# Patient Record
Sex: Female | Born: 1955
Health system: Southern US, Community
[De-identification: ages and names within clinical notes are randomized; demographics above are authoritative.]

## PROBLEM LIST (undated history)

## (undated) DIAGNOSIS — E119 Type 2 diabetes mellitus without complications: Secondary | ICD-10-CM

## (undated) DIAGNOSIS — I10 Essential (primary) hypertension: Secondary | ICD-10-CM

## (undated) DIAGNOSIS — E785 Hyperlipidemia, unspecified: Secondary | ICD-10-CM

## (undated) HISTORY — PX: COLONOSCOPY: SHX174

## (undated) HISTORY — PX: TUBAL LIGATION: SHX77

## (undated) HISTORY — PX: TUMOR EXCISION: SHX421

---
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
## 1998-09-24 HISTORY — PX: BREAST BIOPSY: SHX20

## 1998-11-22 ENCOUNTER — Other Ambulatory Visit: Admission: RE | Admit: 1998-11-22 | Discharge: 1998-11-22 | Payer: Self-pay | Admitting: Obstetrics

## 1999-06-14 ENCOUNTER — Encounter: Payer: Self-pay | Admitting: Family Medicine

## 1999-06-14 ENCOUNTER — Ambulatory Visit (HOSPITAL_COMMUNITY): Admission: RE | Admit: 1999-06-14 | Discharge: 1999-06-14 | Payer: Self-pay | Admitting: Family Medicine

## 1999-06-14 ENCOUNTER — Encounter (INDEPENDENT_AMBULATORY_CARE_PROVIDER_SITE_OTHER): Payer: Self-pay

## 1999-06-14 HISTORY — PX: BREAST EXCISIONAL BIOPSY: SUR124

## 1999-10-17 ENCOUNTER — Other Ambulatory Visit: Admission: RE | Admit: 1999-10-17 | Discharge: 1999-10-17 | Payer: Self-pay | Admitting: Obstetrics

## 2000-11-05 ENCOUNTER — Other Ambulatory Visit: Admission: RE | Admit: 2000-11-05 | Discharge: 2000-11-05 | Payer: Self-pay | Admitting: Obstetrics

## 2001-04-10 ENCOUNTER — Encounter: Payer: Self-pay | Admitting: Family Medicine

## 2001-04-10 ENCOUNTER — Encounter: Admission: RE | Admit: 2001-04-10 | Discharge: 2001-04-10 | Payer: Self-pay | Admitting: Family Medicine

## 2003-04-13 ENCOUNTER — Encounter: Admission: RE | Admit: 2003-04-13 | Discharge: 2003-04-13 | Payer: Self-pay | Admitting: Obstetrics

## 2003-04-13 ENCOUNTER — Encounter: Payer: Self-pay | Admitting: Obstetrics

## 2004-07-05 ENCOUNTER — Encounter: Admission: RE | Admit: 2004-07-05 | Discharge: 2004-07-05 | Payer: Self-pay | Admitting: Obstetrics

## 2004-11-13 ENCOUNTER — Ambulatory Visit (HOSPITAL_COMMUNITY): Admission: RE | Admit: 2004-11-13 | Discharge: 2004-11-13 | Payer: Self-pay | Admitting: Gastroenterology

## 2005-07-12 ENCOUNTER — Encounter: Admission: RE | Admit: 2005-07-12 | Discharge: 2005-07-12 | Payer: Self-pay | Admitting: Obstetrics

## 2006-04-01 ENCOUNTER — Encounter: Admission: RE | Admit: 2006-04-01 | Discharge: 2006-04-01 | Payer: Self-pay | Admitting: Rheumatology

## 2006-08-06 ENCOUNTER — Encounter: Admission: RE | Admit: 2006-08-06 | Discharge: 2006-08-06 | Payer: Self-pay | Admitting: Internal Medicine

## 2007-08-26 ENCOUNTER — Encounter: Admission: RE | Admit: 2007-08-26 | Discharge: 2007-08-26 | Payer: Self-pay | Admitting: Obstetrics

## 2007-08-29 ENCOUNTER — Encounter: Admission: RE | Admit: 2007-08-29 | Discharge: 2007-08-29 | Payer: Self-pay | Admitting: Obstetrics

## 2008-02-25 ENCOUNTER — Encounter: Admission: RE | Admit: 2008-02-25 | Discharge: 2008-02-25 | Payer: Self-pay | Admitting: Internal Medicine

## 2008-09-15 ENCOUNTER — Encounter: Admission: RE | Admit: 2008-09-15 | Discharge: 2008-09-15 | Payer: Self-pay | Admitting: Internal Medicine

## 2009-10-24 ENCOUNTER — Encounter: Admission: RE | Admit: 2009-10-24 | Discharge: 2009-10-24 | Payer: Self-pay | Admitting: Obstetrics

## 2010-10-16 ENCOUNTER — Other Ambulatory Visit: Payer: Self-pay | Admitting: Internal Medicine

## 2010-10-16 DIAGNOSIS — Z1239 Encounter for other screening for malignant neoplasm of breast: Secondary | ICD-10-CM

## 2010-10-26 ENCOUNTER — Ambulatory Visit: Payer: Self-pay

## 2010-10-26 ENCOUNTER — Ambulatory Visit
Admission: RE | Admit: 2010-10-26 | Discharge: 2010-10-26 | Disposition: A | Discharge status: Home / Self Care | Payer: BC Managed Care – PPO | Source: Ambulatory Visit | Attending: Internal Medicine | Admitting: Internal Medicine

## 2010-10-26 DIAGNOSIS — Z1239 Encounter for other screening for malignant neoplasm of breast: Secondary | ICD-10-CM

## 2011-02-09 NOTE — Op Note (Signed)
Monica Ryan, Monica Ryan               ACCOUNT NO.:  [id number]   MEDICAL RECORD NO.:  [id number]          PATIENT TYPE:  AMB   LOCATION:  ENDO                         FACILITY:  [facility]   PHYSICIAN:  Anselmo Rod, M.D.  DATE OF BIRTH:  02/11/1956   DATE OF PROCEDURE:  11/13/2004  DATE OF DISCHARGE:                                 OPERATIVE REPORT   PROCEDURE PERFORMED:  Screening colonoscopy.   ENDOSCOPIST:  Charna Elizabeth, M.D.   INSTRUMENT USED:  Olympus video colonoscope.   INDICATIONS FOR PROCEDURE:  The patient is a 55 year old African-American  female undergoing screening colonoscopy to rule out colonic polyps, masses,  etc.   PREPROCEDURE PREPARATION:  Informed consent was procured from the patient.  The patient was fasted for eight hours prior to the procedure and prepped  with a bottle of magnesium citrate and a gallon of GoLYTELY the night prior  to the procedure.  The risks and benefits of the procedure including a 10%  miss rate for colon polyps or cancers was discussed with the patient as  well.   PREPROCEDURE PHYSICAL:  The patient had stable vital signs.  Neck supple.  Chest clear to auscultation.  S1 and S2 regular.  Abdomen soft with normal  bowel sounds.   DESCRIPTION OF PROCEDURE:  The patient was placed in left lateral decubitus  position and sedated with 70 mg of Demerol and 7.5 mg of Versed in slow  incremental doses.  Once the patient was adequately sedated and maintained  on low flow oxygen and continuous cardiac monitoring, the Olympus video  colonoscope was advanced from the rectum to the cecum.  The appendicular  orifice and ileocecal valve were clearly visualized and photographed.  The  patient had an excellent prep.  No masses, polyps, erosions, ulcerations or  diverticula were seen.  Retroflexion in the rectum revealed no  abnormalities.   IMPRESSION:  Normal colonoscopy up to the cecum, no masses, polyps or  diverticula were seen.   RECOMMENDATIONS:  1.  Repeat colonoscopy is recommended in the next 10 years unless the      patient develops any abnormal symptoms      in the interim.  2.  Outpatient followup as need arises in the future.  3.  Continue high fiber diet with liberal fluid intake as previously      advised.      JNM/MEDQ  D:  11/13/2004  T:  11/13/2004  Job:  [id number]   cc:   Candyce Churn. Allyne Gee, M.D.  [address]  [address]  [place]  [place] [address]  Fax: [phone]   Kathreen Cosier, M.D.  [address]  [place]  [place] [address]  Fax: [phone]

## 2011-10-02 ENCOUNTER — Other Ambulatory Visit: Payer: Self-pay | Admitting: Internal Medicine

## 2011-10-02 DIAGNOSIS — Z1231 Encounter for screening mammogram for malignant neoplasm of breast: Secondary | ICD-10-CM

## 2011-10-29 ENCOUNTER — Ambulatory Visit
Admission: RE | Admit: 2011-10-29 | Discharge: 2011-10-29 | Disposition: A | Discharge status: Home / Self Care | Payer: BC Managed Care – PPO | Source: Ambulatory Visit | Attending: Internal Medicine | Admitting: Internal Medicine

## 2011-10-29 DIAGNOSIS — Z1231 Encounter for screening mammogram for malignant neoplasm of breast: Secondary | ICD-10-CM

## 2012-07-22 ENCOUNTER — Other Ambulatory Visit: Payer: Self-pay | Admitting: Orthopedic Surgery

## 2012-07-24 ENCOUNTER — Encounter (HOSPITAL_BASED_OUTPATIENT_CLINIC_OR_DEPARTMENT_OTHER): Payer: Self-pay | Admitting: *Deleted

## 2012-07-24 NOTE — Progress Notes (Signed)
To come in for bmet-called for ekg from dr Amanda Pea bring all meds and overnight bag

## 2012-07-28 ENCOUNTER — Encounter (HOSPITAL_BASED_OUTPATIENT_CLINIC_OR_DEPARTMENT_OTHER)
Admission: RE | Admit: 2012-07-28 | Discharge: 2012-07-28 | Disposition: A | Discharge status: Home / Self Care | Payer: BC Managed Care – PPO | Source: Ambulatory Visit | Attending: Orthopedic Surgery | Admitting: Orthopedic Surgery

## 2012-07-28 LAB — BASIC METABOLIC PANEL
CO2: 26 mEq/L (ref 19–32)
Calcium: 9.5 mg/dL (ref 8.4–10.5)
Chloride: 102 mEq/L (ref 96–112)
Glucose, Bld: 98 mg/dL (ref 70–99)
Sodium: 140 mEq/L (ref 135–145)

## 2012-07-29 ENCOUNTER — Encounter (HOSPITAL_BASED_OUTPATIENT_CLINIC_OR_DEPARTMENT_OTHER)
Admission: RE | Disposition: A | Discharge status: Home / Self Care | Payer: Self-pay | Source: Ambulatory Visit | Attending: Orthopedic Surgery

## 2012-07-29 ENCOUNTER — Encounter (HOSPITAL_BASED_OUTPATIENT_CLINIC_OR_DEPARTMENT_OTHER): Payer: Self-pay | Admitting: *Deleted

## 2012-07-29 ENCOUNTER — Ambulatory Visit (HOSPITAL_BASED_OUTPATIENT_CLINIC_OR_DEPARTMENT_OTHER)
Admission: RE | Admit: 2012-07-29 | Discharge: 2012-07-30 | Disposition: A | Discharge status: Home / Self Care | Payer: BC Managed Care – PPO | Source: Ambulatory Visit | Attending: Orthopedic Surgery | Admitting: Orthopedic Surgery

## 2012-07-29 ENCOUNTER — Encounter (HOSPITAL_BASED_OUTPATIENT_CLINIC_OR_DEPARTMENT_OTHER): Payer: Self-pay | Admitting: Anesthesiology

## 2012-07-29 ENCOUNTER — Ambulatory Visit (HOSPITAL_BASED_OUTPATIENT_CLINIC_OR_DEPARTMENT_OTHER): Payer: BC Managed Care – PPO | Admitting: Anesthesiology

## 2012-07-29 DIAGNOSIS — M19019 Primary osteoarthritis, unspecified shoulder: Secondary | ICD-10-CM | POA: Insufficient documentation

## 2012-07-29 DIAGNOSIS — E119 Type 2 diabetes mellitus without complications: Secondary | ICD-10-CM | POA: Insufficient documentation

## 2012-07-29 DIAGNOSIS — I10 Essential (primary) hypertension: Secondary | ICD-10-CM | POA: Insufficient documentation

## 2012-07-29 DIAGNOSIS — Z5333 Arthroscopic surgical procedure converted to open procedure: Secondary | ICD-10-CM | POA: Insufficient documentation

## 2012-07-29 DIAGNOSIS — Z79899 Other long term (current) drug therapy: Secondary | ICD-10-CM | POA: Insufficient documentation

## 2012-07-29 DIAGNOSIS — M75120 Complete rotator cuff tear or rupture of unspecified shoulder, not specified as traumatic: Secondary | ICD-10-CM | POA: Insufficient documentation

## 2012-07-29 DIAGNOSIS — E785 Hyperlipidemia, unspecified: Secondary | ICD-10-CM | POA: Insufficient documentation

## 2012-07-29 HISTORY — DX: Essential (primary) hypertension: I10

## 2012-07-29 HISTORY — DX: Type 2 diabetes mellitus without complications: E11.9

## 2012-07-29 HISTORY — DX: Hyperlipidemia, unspecified: E78.5

## 2012-07-29 HISTORY — PX: SHOULDER ARTHROSCOPY WITH ROTATOR CUFF REPAIR AND SUBACROMIAL DECOMPRESSION: SHX5686

## 2012-07-29 LAB — POCT HEMOGLOBIN-HEMACUE: Hemoglobin: 13.3 g/dL (ref 12.0–15.0)

## 2012-07-29 SURGERY — SHOULDER ARTHROSCOPY WITH ROTATOR CUFF REPAIR AND SUBACROMIAL DECOMPRESSION
Anesthesia: Regional | Site: Shoulder | Laterality: Right | Wound class: Clean

## 2012-07-29 MED ORDER — ONDANSETRON HCL 4 MG/2ML IJ SOLN
INTRAMUSCULAR | Status: DC | PRN
  Administered 2012-07-29: 4 mg via INTRAVENOUS

## 2012-07-29 MED ORDER — SODIUM CHLORIDE 0.9 % IR SOLN
Status: DC | PRN
  Administered 2012-07-29: 9000 mL

## 2012-07-29 MED ORDER — ROPIVACAINE HCL 5 MG/ML IJ SOLN
INTRAMUSCULAR | Status: DC | PRN
  Administered 2012-07-29: 30 mL via EPIDURAL

## 2012-07-29 MED ORDER — FENTANYL CITRATE 0.05 MG/ML IJ SOLN
50.0000 ug | INTRAMUSCULAR | Status: DC | PRN
  Administered 2012-07-29: 100 ug via INTRAVENOUS

## 2012-07-29 MED ORDER — HYDROMORPHONE HCL PF 1 MG/ML IJ SOLN: 0.5000 mg | INTRAMUSCULAR | Status: DC | PRN

## 2012-07-29 MED ORDER — SIMVASTATIN 40 MG PO TABS: 40.0000 mg | ORAL_TABLET | Freq: Every evening | ORAL | Status: DC

## 2012-07-29 MED ORDER — METHOCARBAMOL 500 MG PO TABS: 500.0000 mg | ORAL_TABLET | Freq: Four times a day (QID) | ORAL | Status: DC | PRN

## 2012-07-29 MED ORDER — OXYCODONE-ACETAMINOPHEN 5-325 MG PO TABS: 1.0000 | ORAL_TABLET | ORAL | Status: DC | PRN

## 2012-07-29 MED ORDER — IBUPROFEN 600 MG PO TABS: 600.0000 mg | ORAL_TABLET | Freq: Four times a day (QID) | ORAL | Status: DC | PRN

## 2012-07-29 MED ORDER — ACETAMINOPHEN 10 MG/ML IV SOLN
1000.0000 mg | Freq: Once | INTRAVENOUS | Status: AC
  Administered 2012-07-29: 1000 mg via INTRAVENOUS

## 2012-07-29 MED ORDER — METOCLOPRAMIDE HCL 5 MG/ML IJ SOLN: 5.0000 mg | Freq: Three times a day (TID) | INTRAMUSCULAR | Status: DC | PRN

## 2012-07-29 MED ORDER — SODIUM CHLORIDE 0.9 % IV SOLN
INTRAVENOUS | Status: DC
  Administered 2012-07-29: 20 mL/h via INTRAVENOUS

## 2012-07-29 MED ORDER — HYDROMORPHONE HCL 2 MG PO TABS: ORAL_TABLET | ORAL | Status: DC

## 2012-07-29 MED ORDER — CEPHALEXIN 500 MG PO CAPS: 500.0000 mg | ORAL_CAPSULE | Freq: Three times a day (TID) | ORAL | Status: DC

## 2012-07-29 MED ORDER — HYDROMORPHONE HCL PF 1 MG/ML IJ SOLN: 0.2500 mg | INTRAMUSCULAR | Status: DC | PRN

## 2012-07-29 MED ORDER — CHLORHEXIDINE GLUCONATE 4 % EX LIQD: 60.0000 mL | Freq: Once | CUTANEOUS | Status: DC

## 2012-07-29 MED ORDER — PROPOFOL 10 MG/ML IV BOLUS
INTRAVENOUS | Status: DC | PRN
  Administered 2012-07-29: 180 mg via INTRAVENOUS

## 2012-07-29 MED ORDER — EPHEDRINE SULFATE 50 MG/ML IJ SOLN
INTRAMUSCULAR | Status: DC | PRN
  Administered 2012-07-29 (×2): 10 mg via INTRAVENOUS

## 2012-07-29 MED ORDER — OXYCODONE HCL 5 MG PO TABS: 5.0000 mg | ORAL_TABLET | Freq: Once | ORAL | Status: AC | PRN

## 2012-07-29 MED ORDER — METHOCARBAMOL 100 MG/ML IJ SOLN: 500.0000 mg | Freq: Four times a day (QID) | INTRAMUSCULAR | Status: DC | PRN

## 2012-07-29 MED ORDER — SAXAGLIPTIN-METFORMIN ER 5-1000 MG PO TB24: ORAL_TABLET | ORAL | Status: DC

## 2012-07-29 MED ORDER — CEFAZOLIN SODIUM-DEXTROSE 2-3 GM-% IV SOLR
2.0000 g | Freq: Once | INTRAVENOUS | Status: AC
  Administered 2012-07-29: 2 g via INTRAVENOUS

## 2012-07-29 MED ORDER — MIDAZOLAM HCL 2 MG/2ML IJ SOLN
0.5000 mg | INTRAMUSCULAR | Status: DC | PRN
  Administered 2012-07-29: 2 mg via INTRAVENOUS

## 2012-07-29 MED ORDER — LISINOPRIL 20 MG PO TABS: 20.0000 mg | ORAL_TABLET | Freq: Every day | ORAL | Status: DC

## 2012-07-29 MED ORDER — ONDANSETRON HCL 4 MG/2ML IJ SOLN: 4.0000 mg | Freq: Four times a day (QID) | INTRAMUSCULAR | Status: DC | PRN

## 2012-07-29 MED ORDER — METOCLOPRAMIDE HCL 5 MG PO TABS: 5.0000 mg | ORAL_TABLET | Freq: Three times a day (TID) | ORAL | Status: DC | PRN

## 2012-07-29 MED ORDER — LIDOCAINE HCL (CARDIAC) 20 MG/ML IV SOLN
INTRAVENOUS | Status: DC | PRN
  Administered 2012-07-29: 50 mg via INTRAVENOUS

## 2012-07-29 MED ORDER — ONDANSETRON HCL 4 MG PO TABS: 4.0000 mg | ORAL_TABLET | Freq: Four times a day (QID) | ORAL | Status: DC | PRN

## 2012-07-29 MED ORDER — OXYCODONE HCL 5 MG/5ML PO SOLN: 5.0000 mg | Freq: Once | ORAL | Status: AC | PRN

## 2012-07-29 MED ORDER — DEXAMETHASONE SODIUM PHOSPHATE 4 MG/ML IJ SOLN
INTRAMUSCULAR | Status: DC | PRN
  Administered 2012-07-29: 4 mg via INTRAVENOUS

## 2012-07-29 MED ORDER — LACTATED RINGERS IV SOLN
INTRAVENOUS | Status: DC
  Administered 2012-07-29 (×2): via INTRAVENOUS

## 2012-07-29 MED ORDER — CEFAZOLIN SODIUM 1-5 GM-% IV SOLN
1.0000 g | Freq: Four times a day (QID) | INTRAVENOUS | Status: AC
  Administered 2012-07-29 – 2012-07-30 (×3): 1 g via INTRAVENOUS

## 2012-07-29 MED ORDER — SUCCINYLCHOLINE CHLORIDE 20 MG/ML IJ SOLN
INTRAMUSCULAR | Status: DC | PRN
  Administered 2012-07-29: 100 mg via INTRAVENOUS

## 2012-07-29 SURGICAL SUPPLY — 84 items
ANCH SUT PUSHLCK 24X4.5 STRL (Orthopedic Implant) ×2 IMPLANT
ANCH SUT SWLK 19.1 CLS EYLT TL (Anchor) ×1 IMPLANT
ANCH SUT SWLK 19.1X4.75 (Anchor) ×1 IMPLANT
ANCHOR SUT BIO SW 4.75 W/FIB (Anchor) ×1 IMPLANT
ANCHOR SUT BIO SW 4.75X19.1 (Anchor) ×1 IMPLANT
BANDAGE ADHESIVE 1X3 (GAUZE/BANDAGES/DRESSINGS) IMPLANT
BLADE AVERAGE 25X9 (BLADE) IMPLANT
BLADE CUTTER MENIS 5.5 (BLADE) IMPLANT
BLADE SURG 15 STRL LF DISP TIS (BLADE) ×2 IMPLANT
BLADE SURG 15 STRL SS (BLADE) ×4
BUR EGG 3PK/BX (BURR) IMPLANT
BUR OVAL 6.0 (BURR) ×2 IMPLANT
CANISTER OMNI JUG 16 LITER (MISCELLANEOUS) ×3 IMPLANT
CANISTER SUCTION 2500CC (MISCELLANEOUS) IMPLANT
CANNULA TWIST IN 8.25X7CM (CANNULA) ×1 IMPLANT
CLEANER CAUTERY TIP 5X5 PAD (MISCELLANEOUS) IMPLANT
CLOTH BEACON ORANGE TIMEOUT ST (SAFETY) ×2 IMPLANT
CUTTER MENISCUS  4.2MM (BLADE) ×1
CUTTER MENISCUS 4.2MM (BLADE) ×1 IMPLANT
DECANTER SPIKE VIAL GLASS SM (MISCELLANEOUS) IMPLANT
DRAPE INCISE IOBAN 66X45 STRL (DRAPES) ×2 IMPLANT
DRAPE STERI 35X30 U-POUCH (DRAPES) ×2 IMPLANT
DRAPE SURG 17X23 STRL (DRAPES) ×2 IMPLANT
DRAPE U-SHAPE 47X51 STRL (DRAPES) ×2 IMPLANT
DRAPE U-SHAPE 76X120 STRL (DRAPES) ×4 IMPLANT
DRSG PAD ABDOMINAL 8X10 ST (GAUZE/BANDAGES/DRESSINGS) ×2 IMPLANT
DURAPREP 26ML APPLICATOR (WOUND CARE) ×2 IMPLANT
ELECT REM PT RETURN 9FT ADLT (ELECTROSURGICAL) ×2
ELECTRODE REM PT RTRN 9FT ADLT (ELECTROSURGICAL) IMPLANT
GLOVE BIO SURGEON STRL SZ 6.5 (GLOVE) ×2 IMPLANT
GLOVE BIOGEL M STRL SZ7.5 (GLOVE) ×2 IMPLANT
GLOVE BIOGEL PI IND STRL 7.0 (GLOVE) IMPLANT
GLOVE BIOGEL PI IND STRL 8 (GLOVE) ×2 IMPLANT
GLOVE BIOGEL PI INDICATOR 7.0 (GLOVE) ×2
GLOVE BIOGEL PI INDICATOR 8 (GLOVE) ×2
GLOVE ORTHO TXT STRL SZ7.5 (GLOVE) ×2 IMPLANT
GOWN BRE IMP PREV XXLGXLNG (GOWN DISPOSABLE) ×3 IMPLANT
GOWN PREVENTION PLUS XLARGE (GOWN DISPOSABLE) ×2 IMPLANT
GOWN STRL REIN 2XL XLG LVL4 (GOWN DISPOSABLE) ×2 IMPLANT
NDL SCORPION (NEEDLE) ×1 IMPLANT
NDL SUT 6 .5 CRC .975X.05 MAYO (NEEDLE) IMPLANT
NEEDLE MAYO TAPER (NEEDLE) ×2
NEEDLE MINI RC 24MM (NEEDLE) IMPLANT
NEEDLE SCORPION (NEEDLE) ×2 IMPLANT
PACK ARTHROSCOPY DSU (CUSTOM PROCEDURE TRAY) ×2 IMPLANT
PACK BASIN DAY SURGERY FS (CUSTOM PROCEDURE TRAY) ×2 IMPLANT
PAD CLEANER CAUTERY TIP 5X5 (MISCELLANEOUS)
PASSER SUT SWANSON 36MM LOOP (INSTRUMENTS) IMPLANT
PENCIL BUTTON HOLSTER BLD 10FT (ELECTRODE) ×1 IMPLANT
PUSHLOCK PEEK 4.5X24 (Orthopedic Implant) ×2 IMPLANT
SLEEVE SCD COMPRESS KNEE MED (MISCELLANEOUS) ×2 IMPLANT
SLING ARM FOAM STRAP LRG (SOFTGOODS) ×1 IMPLANT
SLING ARM FOAM STRAP MED (SOFTGOODS) IMPLANT
SPONGE GAUZE 4X4 12PLY (GAUZE/BANDAGES/DRESSINGS) ×2 IMPLANT
SPONGE LAP 4X18 X RAY DECT (DISPOSABLE) ×1 IMPLANT
STRIP CLOSURE SKIN 1/2X4 (GAUZE/BANDAGES/DRESSINGS) ×1 IMPLANT
SUCTION FRAZIER TIP 10 FR DISP (SUCTIONS) IMPLANT
SUT ETHIBOND 2 OS 4 DA (SUTURE) IMPLANT
SUT ETHILON 4 0 PS 2 18 (SUTURE) IMPLANT
SUT FIBERWIRE #2 38 T-5 BLUE (SUTURE)
SUT FIBERWIRE 3-0 18 TAPR NDL (SUTURE)
SUT PROLENE 1 CT (SUTURE) IMPLANT
SUT PROLENE 3 0 PS 2 (SUTURE) ×2 IMPLANT
SUT TIGER TAPE 7 IN WHITE (SUTURE) ×1 IMPLANT
SUT VIC AB 0 CT1 27 (SUTURE)
SUT VIC AB 0 CT1 27XBRD ANBCTR (SUTURE) IMPLANT
SUT VIC AB 0 SH 27 (SUTURE) ×1 IMPLANT
SUT VIC AB 2-0 SH 27 (SUTURE) ×2
SUT VIC AB 2-0 SH 27XBRD (SUTURE) IMPLANT
SUT VIC AB 3-0 SH 27 (SUTURE)
SUT VIC AB 3-0 SH 27X BRD (SUTURE) IMPLANT
SUT VIC AB 3-0 X1 27 (SUTURE) IMPLANT
SUTURE FIBERWR #2 38 T-5 BLUE (SUTURE) IMPLANT
SUTURE FIBERWR 3-0 18 TAPR NDL (SUTURE) IMPLANT
SYR 3ML 23GX1 SAFETY (SYRINGE) IMPLANT
SYR BULB 3OZ (MISCELLANEOUS) IMPLANT
TAPE FIBER 2MM 7IN #2 BLUE (SUTURE) ×1 IMPLANT
TAPE PAPER 3X10 WHT MICROPORE (GAUZE/BANDAGES/DRESSINGS) ×2 IMPLANT
TOWEL OR 17X24 6PK STRL BLUE (TOWEL DISPOSABLE) ×2 IMPLANT
TUBE CONNECTING 20X1/4 (TUBING) ×3 IMPLANT
TUBING ARTHROSCOPY IRRIG 16FT (MISCELLANEOUS) ×2 IMPLANT
WAND STAR VAC 90 (SURGICAL WAND) ×2 IMPLANT
WATER STERILE IRR 1000ML POUR (IV SOLUTION) ×2 IMPLANT
YANKAUER SUCT BULB TIP NO VENT (SUCTIONS) IMPLANT

## 2012-07-29 NOTE — Anesthesia Procedure Notes (Addendum)
Anesthesia Regional Block:  Interscalene brachial plexus block  Pre-Anesthetic Checklist: ,, timeout performed, Correct Patient, Correct Site, Correct Laterality, Correct Procedure, Correct Position, site marked, Risks and benefits discussed, pre-op evaluation,  At surgeon's request and post-op pain management  Laterality: Right  Prep: Maximum Sterile Barrier Precautions used and chloraprep       Needles:  Injection technique: Single-shot  Needle Type: Echogenic Stimulator Needle      Needle Gauge: 22 and 22 G    Additional Needles:  Procedures: ultrasound guided (picture in chart) and nerve stimulator Interscalene brachial plexus block  Nerve Stimulator or Paresthesia:  Response: Biceps response, 0.4 mA,   Additional Responses:   Narrative:  Start time: 07/29/2012 11:44 AM End time: 07/29/2012 11:50 AM Injection made incrementally with aspirations every 5 mL. Anesthesiologist: Sampson Goon, MD  Additional Notes: 2% Lidocaine skin wheel.   Interscalene brachial plexus block Procedure Name: Intubation Date/Time: 07/29/2012 12:40 PM Performed by: Caren Macadam Pre-anesthesia Checklist: Patient identified, Emergency Drugs available, Suction available and Patient being monitored Patient Re-evaluated:Patient Re-evaluated prior to inductionOxygen Delivery Method: Circle System Utilized Preoxygenation: Pre-oxygenation with 100% oxygen Intubation Type: IV induction Ventilation: Mask ventilation without difficulty Laryngoscope Size: Miller and 2 Grade View: Grade II Tube type: Oral Tube size: 8.0 mm Number of attempts: 1 Airway Equipment and Method: stylet and oral airway Placement Confirmation: ETT inserted through vocal cords under direct vision,  positive ETCO2 and breath sounds checked- equal and bilateral Secured at: 21 cm Tube secured with: Tape Dental Injury: Teeth and Oropharynx as per pre-operative assessment

## 2012-07-29 NOTE — Transfer of Care (Signed)
Immediate Anesthesia Transfer of Care Note  Patient: Monica Ryan  Procedure(s) Performed: Procedure(s) (LRB) with comments: SHOULDER ARTHROSCOPY WITH ROTATOR CUFF REPAIR AND SUBACROMIAL DECOMPRESSION (Right) - Arthroscopy Subacromial Decompression, Distal Clavicle Resection, Arthroscopic Subscapularis Repair, Open Repair of Supraspinatus and Infraspinatus    Patient Location: PACU  Anesthesia Type:GA combined with regional for post-op pain  Level of Consciousness: awake  Airway & Oxygen Therapy: Patient Spontanous Breathing and Patient connected to face mask oxygen  Post-op Assessment: Report given to PACU RN and Post -op Vital signs reviewed and stable  Post vital signs: Reviewed and stable  Complications: No apparent anesthesia complications

## 2012-07-29 NOTE — Anesthesia Postprocedure Evaluation (Signed)
  Anesthesia Post-op Note  Patient: Monica Ryan  Procedure(s) Performed: Procedure(s) (LRB) with comments: SHOULDER ARTHROSCOPY WITH ROTATOR CUFF REPAIR AND SUBACROMIAL DECOMPRESSION (Right) - Arthroscopy Subacromial Decompression, Distal Clavicle Resection, Arthroscopic Subscapularis Repair, Open Repair of Supraspinatus and Infraspinatus    Patient Location: PACU  Anesthesia Type:GA combined with regional for post-op pain  Level of Consciousness: awake and alert   Airway and Oxygen Therapy: Patient Spontanous Breathing  Post-op Pain: none  Post-op Assessment: Post-op Vital signs reviewed, Patient's Cardiovascular Status Stable, Respiratory Function Stable, Patent Airway and No signs of Nausea or vomiting  Post-op Vital Signs: Reviewed and stable  Complications: No apparent anesthesia complications

## 2012-07-29 NOTE — Anesthesia Preprocedure Evaluation (Signed)
Anesthesia Evaluation  Patient identified by MRN, date of birth, ID band Patient awake    Reviewed: Allergy & Precautions, H&P , NPO status , Patient's Chart, lab work & pertinent test results  Airway Mallampati: II TM Distance: >3 FB Neck ROM: Full    Dental No notable dental hx. (+) Teeth Intact and Dental Advisory Given   Pulmonary neg pulmonary ROS,  breath sounds clear to auscultation  Pulmonary exam normal       Cardiovascular hypertension, On Medications Rhythm:Regular Rate:Normal     Neuro/Psych negative neurological ROS  negative psych ROS   GI/Hepatic negative GI ROS, Neg liver ROS,   Endo/Other  diabetes, Type 2, Oral Hypoglycemic Agents  Renal/GU negative Renal ROS  negative genitourinary   Musculoskeletal   Abdominal   Peds  Hematology negative hematology ROS (+)   Anesthesia Other Findings   Reproductive/Obstetrics negative OB ROS                           Anesthesia Physical Anesthesia Plan  ASA: II  Anesthesia Plan: General and Regional   Post-op Pain Management:    Induction: Intravenous  Airway Management Planned: Oral ETT  Additional Equipment:   Intra-op Plan:   Post-operative Plan: Extubation in OR  Informed Consent: I have reviewed the patients History and Physical, chart, labs and discussed the procedure including the risks, benefits and alternatives for the proposed anesthesia with the patient or authorized representative who has indicated his/her understanding and acceptance.   Dental advisory given  Plan Discussed with: CRNA  Anesthesia Plan Comments:         Anesthesia Quick Evaluation

## 2012-07-29 NOTE — Progress Notes (Signed)
Assisted Dr. Fitzgerald with right, ultrasound guided, interscalene  block. Side rails up, monitors on throughout procedure. See vital signs in flow sheet. Tolerated Procedure well. 

## 2012-07-29 NOTE — Brief Op Note (Signed)
07/29/2012  2:24 PM  PATIENT:  Monica Ryan  56 y.o. female  PRE-OPERATIVE DIAGNOSIS:  right shoulder rotator cuff tear, acromioclavicular arthritis  POST-OPERATIVE DIAGNOSIS:  right shoulder rotator cuff tear, three tendon and acromioclavicular arthritis  PROCEDURE:  Procedure(s) (LRB) with comments: SHOULDER ARTHROSCOPY WITH ROTATOR CUFF REPAIR AND SUBACROMIAL DECOMPRESSION (Right) - Arthroscopy Subacromial Decompression, Distal Clavicle Resection, Arthroscopic Subscapularis Repair, Open Repair of Supraspinatus and Infraspinatus    SURGEON:  Surgeon(s) and Role:    * Wyn Forster., MD - Primary  PHYSICIAN ASSISTANT:   ASSISTANTS:Raudel Bazen Dasnoit,P.A-C    ANESTHESIA:   general  EBL:  Total I/O In: 1500 [I.V.:1500] Out: -   BLOOD ADMINISTERED:none  DRAINS: none   LOCAL MEDICATIONS USED:  ropivicaine plexus block  SPECIMEN:  No Specimen  DISPOSITION OF SPECIMEN:  N/A  COUNTS:  YES  TOURNIQUET:  * No tourniquets in log *  DICTATION: .Other Dictation: Dictation Number dictated and signed  PLAN OF CARE: admit to recovery care after PACU  PATIENT DISPOSITION:  PACU - hemodynamically stable.

## 2012-07-29 NOTE — H&P (Signed)
Monica Ryan is an 56 y.o. female.   Chief Complaint: c/o chronic and progressive pain right shoulder HPI:  Patient presented to our office recently for evaluation and treatment of multiple orthopedic problems on 07/02/12 which involved progression of her CTS symptoms. During our conversation she reports that her main problem at this time is increasing pain in her right shoulder. She cannot sleep comfortably at night, she cannot work on her hair, and she cannot lie on her right side. She has weakness of abduction, scaption, and external rotation in 90 degrees abduction.    Past Medical History  Diagnosis Date  . Hypertension   . Diabetes mellitus without complication   . Hyperlipemia     Past Surgical History  Procedure Date  . Tubal ligation   . Tumor excision     right jaw  . Breast lumpectomy 1974    right-negative  . Colonoscopy     No family history on file. Social History:  reports that she has never smoked. She does not have any smokeless tobacco history on file. She reports that she drinks alcohol. Her drug history not on file.  Allergies: No Known Allergies  No prescriptions prior to admission    Results for orders placed during the hospital encounter of 07/29/12 (from the past 48 hour(s))  BASIC METABOLIC PANEL     Status: Normal   Collection Time   07/28/12 11:00 AM      Component Value Range Comment   Sodium 140  135 - 145 mEq/L    Potassium 3.6  3.5 - 5.1 mEq/L    Chloride 102  96 - 112 mEq/L    CO2 26  19 - 32 mEq/L    Glucose, Bld 98  70 - 99 mg/dL    BUN 16  6 - 23 mg/dL    Creatinine, Ser 1.61  0.50 - 1.10 mg/dL    Calcium 9.5  8.4 - 09.6 mg/dL    GFR calc non Af Amer >90  >90 mL/min    GFR calc Af Amer >90  >90 mL/min     No results found.   Pertinent items are noted in HPI.  Height 5\' 1"  (1.549 m), weight 79.379 kg (175 lb).  General appearance: alert Head: Normocephalic, without obvious abnormality Neck: supple, symmetrical, trachea  midline Resp: clear to auscultation bilaterally Cardio: regular rate and rhythm GI: normal findings: bowel sounds normal Extremities:  She has restricted cervical ROM consistent with cervical degenerative disc disease. She does not have radicular signs or symptoms. She has restriction in shoulder ROM with combined abduction right shoulder 170 degrees vs 175 on the left. She has dyssynergy on the right consistent with impingement. She has a positive rapid abduction sign, pain with cross torso adduction, and weakness on true scaption. She can internally rotate to T12 bilaterally. She has a painful push off test on the right. Her belly pad is negative. She has pain on forward flexion and O'Brien's testing on the right. She has external rotation 90 degrees abduction 90 degrees right and 90 degrees left. She can extend to the interscapular plane bilaterally. Plain films of her shoulder demonstrate a type III acromion and AC degenerative change. She does not have gross irregularity at the greater tuberosity.  Pulses: 2+ and symmetric Skin:WNL Neurologic:alert and oriented X3    Assessment/Plan Impression: Right shoulder impingement with A/C arthrosis and RC tear  Plan: To the OR for right SA with SAD/DCR and RC repair as needed.The procedure, risks,benefits and  post-op course were discussed with the patient at length and they were in agreement with the plan.   DASNOIT,Monica Ryan 07/29/2012, 7:11 AM

## 2012-07-30 ENCOUNTER — Encounter (HOSPITAL_BASED_OUTPATIENT_CLINIC_OR_DEPARTMENT_OTHER): Payer: Self-pay | Admitting: Orthopedic Surgery

## 2012-07-30 NOTE — Op Note (Signed)
NAMECLAUD, Monica Ryan               ACCOUNT NO.:  [id number]  MEDICAL RECORD NO.:  [id number]  LOCATION:                                 FACILITY:  PHYSICIAN:  Katy Fitch. Ermalinda Joubert, M.D. DATE OF BIRTH:  01/04/1956  DATE OF PROCEDURE:  07/29/2012 DATE OF DISCHARGE:                              OPERATIVE REPORT   PREOPERATIVE DIAGNOSIS:  MRI documented full-thickness rotator cuff tear with unfavorable AC anatomy, and labral degenerative changes.  POSTOPERATIVE DIAGNOSIS:  Grade 2 subscapularis rotator cuff tendon tear and full-thickness infraspinatus and supraspinatus and advanced degenerative arthritis of acromioclavicular joint.  OPERATION: 1. Diagnostic arthroscopy, right glenohumeral joint. 2. Arthroscopic debridement of subscapularis followed by arthroscopic     reconstruction of grade 2 subscapularis rotator cuff tear with     decortication of lesser tuberosity and placement of a fiber tape     reverse mattress stitch to a 4.75-mm swivel lock at superior     greater tuberosity.  This also buttressed the long head of the     biceps to prevent medial migration. 3. Arthroscopic subacromial decompression with bursectomy,     coracoacromial ligament release, and acromioplasty. 4. Arthroscopic distal clavicle resection. 5. Hybrid reconstruction of rotator cuff, supraspinatus/infraspinatus     junction tear to decorticated greater tuberosity.  OPERATING SURGEON:  Katy Fitch. Farrel Guimond, M.D.  ASSISTANT:  Marveen Reeks. Dasnoit, PA-C.  ANESTHESIA:  General endotracheal supplemented by a right interscalene block.  SUPERVISING ANESTHESIOLOGIST:  Bufford Buttner, MD.  INDICATIONS:  Monica Ryan is a 56 year old woman referred through the courtesy of Dr. Dorothyann Peng for evaluation and management of shoulder pain and impairment.  Clinical examination revealed signs of probable rotator cuff tear and impingement.  An MRI of the shoulder documented full-thickness tearing of the  supraspinatus/infraspinatus rotator cuff tendons at the junction of these 2 tendons laterally.  In addition, there was abnormal signal in the superior subscapularis and unfavorable AC anatomy.  We advised Monica Ryan to undergo diagnostic arthroscopy anticipating arthroscopic subacromial decompression, distal clavicle resection, and possible repair of a PASTA tear versus a full-thickness rotator cuff tear.  Preoperatively, she was reminded of potential risks and benefits of surgery.  Questions were invited and answered in detail.  PROCEDURE:  Monica Ryan was interviewed in the holding area and reminded of our findings on the MRI.  We recommended proceeding with diagnostic arthroscopy, subacromial decompression, distal clavicle resection, and anticipated repair of the rotator cuff.  She was advised as to the aftercare for simple decompression versus rotator cuff repair. Questions were invited and answered in detail.  She was subsequently interviewed by Dr. Sampson Goon of Anesthesia.  After informed consent, he placed a ropivacaine interscalene block with ultrasound control.  Excellent anesthesia of the right shoulder and forequarter was achieved.  Monica Ryan was subsequently transferred to room 2 of the [facility] where under Dr. Jarrett Ables direct supervision, general endotracheal anesthesia was induced.  She was carefully positioned in the beach chair position with aid of a torso and head holder designed for shoulder arthroscopy.  The entire right upper extremity and forequarter was prepped with DuraPrep and draped with impervious arthroscopy drapes.  Following routine surgical time-out,  procedure commenced with instrumenting the shoulder with an anterior switching stick technique, placing an arthroscope into the posterior viewing portal bluntly.  Diagnostic arthroscopy confirmed a full-thickness rotator cuff tear at the junction of the supraspinatus/infraspinatus  tendons.  The subscapularis had a grade 2 tear that had separated from the lesser tuberosity.  The long head of the biceps had some fray at the rotator interval and had medialized slightly.  There was an emerging comma sign.  We performed debridement of the labrum, granulation tissue, synovitis, and the deep surface of the rotator cuff tears.  The lesser tuberosity was decorticated with a suction shaver brought into an anterior superior lateral portal followed by placement of a clear cannula.  A scorpion suture passer was used to place a fiber tape with a reverse mattress suture into the subscapularis with excellent purchase.  The clear cannula was then placed in the anterior portal followed by placement of a 4.75-mm swivel lock advancing the subscapularis to an anatomic footprint.  With a reverse mattress suture, we created a buttress for the long head of the biceps to prevent medial subluxation.  After completion of the subscapularis repair, a photograph was obtained with a digital camera documenting the repair.  We then removed the arthroscope from the glenohumeral joint and placed in the subacromial space.  There was florid bursitis.  After bursectomy, the coracoacromial arch was studied.  This was type 2 acromion.  Coracoacromial ligament was released with cutting cautery followed by leveling of the acromion to a type 1 morphology with a suction bur.  The capsule AC joint was debrided followed by resection of the distal cm of clavicle with the arthroscopic bur.  We then cleared all debris from the subacromial space and directly inspected the full-thickness rotator cuff tear.  We freshened the margins with a suction shaver followed by removal of the arthroscopic equipment.  We created a 2 cm muscle-splitting incision. Due to the multiple layers of delamination, I did not feel that I could optimally repair this tear with arthroscopic technique.  We subsequently exposed the tear,  performed a hand rasp feathering of the lateral and anterior acromion.  After bursectomy, we used a suction bur to decorticate the greater tuberosity and subsequently placed a 4.75-mm swivel lock with fiber tape and fiber wire at the articular cartilage margin.  We then placed a medial mattress suture with the fiber wire, which was tied with a knot pusher.  We then used the tail of the fiber tape with a modified baseball stitch closing the interval between the supraspinatus/infraspinatus.  We secured the tendon laterally with a double row repair with a pair of PushLocks using 1 tail of the fiber tape and 1 tail of the fiber wire.  An anatomic inset of the tendon margin was achieved with a low profile.  After hemostasis was achieved, the subacromial space was irrigated with the arthroscopic cannula followed by replacing the scope within the glenohumeral joint.  We confirmed that the long head of the biceps was not fouled and the footprint of repair was anatomic.  Documentation of the repair was accomplished with digital camera.  The joint was debrided of clot and debris from the repair with a suction shaver followed by repair of the portals with interdermal 3-0 Prolene suture.  Monica Ryan tolerated the surgery and anesthesia well.  She was placed in a sling and transferred to the recovery with stable signs.  She will be admitted to the recovery care center for observation of  vital signs and appropriate analgesics in the form of p.o. and IV Dilaudid as well as IV Ancef 1 g q.8 hours x3 doses.     Katy Fitch Monica Ryan, M.D.     RVS/MEDQ  D:  07/29/2012  T:  07/30/2012  Job:  [id number]  cc:   Candyce Churn. Allyne Gee, M.D.

## 2012-10-07 ENCOUNTER — Other Ambulatory Visit: Payer: Self-pay | Admitting: Internal Medicine

## 2012-10-07 DIAGNOSIS — Z1231 Encounter for screening mammogram for malignant neoplasm of breast: Secondary | ICD-10-CM

## 2012-11-03 ENCOUNTER — Ambulatory Visit
Admission: RE | Admit: 2012-11-03 | Discharge: 2012-11-03 | Disposition: A | Discharge status: Home / Self Care | Payer: BC Managed Care – PPO | Source: Ambulatory Visit | Attending: Internal Medicine | Admitting: Internal Medicine

## 2012-11-03 DIAGNOSIS — Z1231 Encounter for screening mammogram for malignant neoplasm of breast: Secondary | ICD-10-CM

## 2013-10-23 ENCOUNTER — Other Ambulatory Visit: Payer: Self-pay

## 2013-10-23 DIAGNOSIS — Z1231 Encounter for screening mammogram for malignant neoplasm of breast: Secondary | ICD-10-CM

## 2013-11-10 ENCOUNTER — Ambulatory Visit: Payer: BC Managed Care – PPO

## 2013-11-20 ENCOUNTER — Inpatient Hospital Stay: Admission: RE | Admit: 2013-11-20 | Payer: BC Managed Care – PPO | Source: Ambulatory Visit

## 2013-12-03 ENCOUNTER — Ambulatory Visit
Admission: RE | Admit: 2013-12-03 | Discharge: 2013-12-03 | Disposition: A | Discharge status: Home / Self Care | Payer: BC Managed Care – PPO | Source: Ambulatory Visit

## 2013-12-03 DIAGNOSIS — Z1231 Encounter for screening mammogram for malignant neoplasm of breast: Secondary | ICD-10-CM

## 2014-11-02 ENCOUNTER — Other Ambulatory Visit: Payer: Self-pay

## 2014-11-02 DIAGNOSIS — Z1231 Encounter for screening mammogram for malignant neoplasm of breast: Secondary | ICD-10-CM

## 2014-12-06 ENCOUNTER — Ambulatory Visit
Admission: RE | Admit: 2014-12-06 | Discharge: 2014-12-06 | Disposition: A | Discharge status: Home / Self Care | Payer: BC Managed Care – PPO | Source: Ambulatory Visit

## 2014-12-06 DIAGNOSIS — Z1231 Encounter for screening mammogram for malignant neoplasm of breast: Secondary | ICD-10-CM

## 2015-01-10 ENCOUNTER — Encounter: Payer: Self-pay | Admitting: Internal Medicine

## 2015-05-04 LAB — CYTOLOGY - PAP: PAP SMEAR: NEGATIVE

## 2015-11-29 ENCOUNTER — Other Ambulatory Visit: Payer: Self-pay

## 2015-11-29 DIAGNOSIS — Z1231 Encounter for screening mammogram for malignant neoplasm of breast: Secondary | ICD-10-CM

## 2015-12-13 ENCOUNTER — Ambulatory Visit
Admission: RE | Admit: 2015-12-13 | Discharge: 2015-12-13 | Disposition: A | Discharge status: Home / Self Care | Payer: BC Managed Care – PPO | Source: Ambulatory Visit

## 2015-12-13 DIAGNOSIS — Z1231 Encounter for screening mammogram for malignant neoplasm of breast: Secondary | ICD-10-CM

## 2016-12-11 ENCOUNTER — Other Ambulatory Visit: Payer: Self-pay | Admitting: Internal Medicine

## 2016-12-11 DIAGNOSIS — Z1231 Encounter for screening mammogram for malignant neoplasm of breast: Secondary | ICD-10-CM

## 2016-12-27 ENCOUNTER — Ambulatory Visit
Admission: RE | Admit: 2016-12-27 | Discharge: 2016-12-27 | Disposition: A | Discharge status: Home / Self Care | Payer: BC Managed Care – PPO | Source: Ambulatory Visit | Attending: Internal Medicine | Admitting: Internal Medicine

## 2016-12-27 DIAGNOSIS — Z1231 Encounter for screening mammogram for malignant neoplasm of breast: Secondary | ICD-10-CM

## 2017-12-18 ENCOUNTER — Other Ambulatory Visit: Payer: Self-pay | Admitting: Internal Medicine

## 2017-12-18 DIAGNOSIS — Z1231 Encounter for screening mammogram for malignant neoplasm of breast: Secondary | ICD-10-CM

## 2018-01-09 ENCOUNTER — Ambulatory Visit
Admission: RE | Admit: 2018-01-09 | Discharge: 2018-01-09 | Disposition: A | Discharge status: Home / Self Care | Payer: BC Managed Care – PPO | Source: Ambulatory Visit | Attending: Internal Medicine | Admitting: Internal Medicine

## 2018-01-09 DIAGNOSIS — Z1231 Encounter for screening mammogram for malignant neoplasm of breast: Secondary | ICD-10-CM

## 2018-06-17 DIAGNOSIS — M79671 Pain in right foot: Secondary | ICD-10-CM

## 2018-06-17 DIAGNOSIS — I129 Hypertensive chronic kidney disease with stage 1 through stage 4 chronic kidney disease, or unspecified chronic kidney disease: Secondary | ICD-10-CM | POA: Diagnosis not present

## 2018-06-17 DIAGNOSIS — M542 Cervicalgia: Secondary | ICD-10-CM

## 2018-06-17 DIAGNOSIS — Z23 Encounter for immunization: Secondary | ICD-10-CM | POA: Diagnosis not present

## 2018-06-17 DIAGNOSIS — N182 Chronic kidney disease, stage 2 (mild): Secondary | ICD-10-CM | POA: Diagnosis not present

## 2018-06-17 DIAGNOSIS — N08 Glomerular disorders in diseases classified elsewhere: Secondary | ICD-10-CM | POA: Diagnosis not present

## 2018-06-17 DIAGNOSIS — E1122 Type 2 diabetes mellitus with diabetic chronic kidney disease: Secondary | ICD-10-CM | POA: Diagnosis not present

## 2018-07-29 ENCOUNTER — Encounter: Payer: Self-pay | Admitting: Internal Medicine

## 2018-07-29 ENCOUNTER — Ambulatory Visit: Payer: BC Managed Care – PPO | Admitting: Internal Medicine

## 2018-07-29 VITALS — BP 116/72 | HR 77 | Temp 97.7°F | Ht 61.0 in | Wt 190.6 lb

## 2018-07-29 DIAGNOSIS — M79622 Pain in left upper arm: Secondary | ICD-10-CM | POA: Diagnosis not present

## 2018-07-29 DIAGNOSIS — E1165 Type 2 diabetes mellitus with hyperglycemia: Secondary | ICD-10-CM | POA: Diagnosis not present

## 2018-07-29 NOTE — Progress Notes (Signed)
  Subjective:     Patient ID: Monica Ryan , female    DOB: January 11, 1956 , 62 y.o.   MRN: [id number]   Chief Complaint  Patient presents with  . Ozempic f/u    HPI  She is here today for f/u Ozempic. Her dose was increased to 1mg  once weekly at her last visit. She has not had any issues with this dose. She has noticed some improvement in her blood sugars. She is not sure if she has lost weight. She admits that she is not yet exercising regularly.     Past Medical History:  Diagnosis Date  . Diabetes mellitus without complication (HCC)   . Hyperlipemia   . Hypertension      Family History  Problem Relation Age of Onset  . Diabetes Mother   . Alzheimer's disease Father   . Diabetes Sister   . Diabetes Brother      Current Outpatient Medications:  .  ACCU-CHEK FASTCLIX LANCETS MISC, by Does not apply route., Disp: , Rfl:  .  aspirin EC 81 MG tablet, Take 81 mg by mouth daily., Disp: , Rfl:  .  dapagliflozin propanediol (FARXIGA) 10 MG TABS tablet, Take 10 mg by mouth daily., Disp: , Rfl:  .  glucose blood (ACCU-CHEK SMARTVIEW) test strip, 1 each by Other route as needed for other. Use as instructed, Disp: , Rfl:  .  lisinopril (PRINIVIL,ZESTRIL) 20 MG tablet, Take 20 mg by mouth daily., Disp: , Rfl:  .  Semaglutide, 1 MG/DOSE, (OZEMPIC, 1 MG/DOSE,) 2 MG/1.5ML SOPN, Inject into the skin., Disp: , Rfl:  .  simvastatin (ZOCOR) 40 MG tablet, Take 40 mg by mouth every evening., Disp: , Rfl:    No Known Allergies   Review of Systems  Constitutional: Negative.   Respiratory: Negative.   Cardiovascular: Negative.   Gastrointestinal: Negative.   Musculoskeletal:       She c/o left axillary pain. Her sx started w/in past week. She is not sure what may have triggered her sx. She has not felt any lumps.   Psychiatric/Behavioral: Negative.      Today's Vitals   07/29/18 0942  BP: 116/72  Pulse: 77  Temp: 97.7 F (36.5 C)  TempSrc: Oral  Weight: 190 lb 9.6 oz (86.5 kg)   Height: 5\' 1"  (1.549 m)   Body mass index is 36.01 kg/m.   Objective:  Physical Exam  Constitutional: She is oriented to person, place, and time. She appears well-developed and well-nourished.  HENT:  Head: Normocephalic and atraumatic.  Cardiovascular: Normal rate, regular rhythm and normal heart sounds.  Pulmonary/Chest: Effort normal and breath sounds normal.  Musculoskeletal:  Left axilla -tenderness to palpation, esp at lower axilla. No overlying erythema. No mass appreciated.   Neurological: She is oriented to person, place, and time.  Psychiatric: She has a normal mood and affect.  Nursing note and vitals reviewed.       Assessment And Plan:     1. Uncontrolled type 2 diabetes mellitus with hyperglycemia (HCC)  She will continue with Ozempic 1mg  once weekly. She will rto in Dec 2019 for her next a1c. Again, importance of regular exercise was discussed with the patient.   2. Axillary pain, left  I think her sx are due to musculoskeletal strain. However, I will refer her for left breast/axillary ultrasound to ensure her sx are not due to breast disorder. She is in agreement with her treatment plan.   [name], MD

## 2018-08-04 ENCOUNTER — Other Ambulatory Visit: Payer: Self-pay | Admitting: Internal Medicine

## 2018-08-04 DIAGNOSIS — M79622 Pain in left upper arm: Secondary | ICD-10-CM

## 2018-08-08 ENCOUNTER — Other Ambulatory Visit: Payer: Self-pay | Admitting: Internal Medicine

## 2018-08-11 ENCOUNTER — Ambulatory Visit
Admission: RE | Admit: 2018-08-11 | Discharge: 2018-08-11 | Disposition: A | Discharge status: Home / Self Care | Payer: BC Managed Care – PPO | Source: Ambulatory Visit | Attending: Internal Medicine | Admitting: Internal Medicine

## 2018-08-11 DIAGNOSIS — M79622 Pain in left upper arm: Secondary | ICD-10-CM

## 2018-09-05 LAB — HM DIABETES EYE EXAM

## 2018-09-30 ENCOUNTER — Encounter: Payer: Self-pay | Admitting: Internal Medicine

## 2018-09-30 ENCOUNTER — Ambulatory Visit: Payer: BC Managed Care – PPO | Admitting: Internal Medicine

## 2018-09-30 ENCOUNTER — Other Ambulatory Visit (HOSPITAL_COMMUNITY)
Admission: RE | Admit: 2018-09-30 | Discharge: 2018-09-30 | Disposition: A | Discharge status: Home / Self Care | Payer: BC Managed Care – PPO | Source: Ambulatory Visit | Attending: Internal Medicine | Admitting: Internal Medicine

## 2018-09-30 VITALS — BP 124/80 | HR 83 | Temp 97.6°F | Ht 61.0 in | Wt 192.6 lb

## 2018-09-30 DIAGNOSIS — Z0000 Encounter for general adult medical examination without abnormal findings: Secondary | ICD-10-CM | POA: Diagnosis present

## 2018-09-30 DIAGNOSIS — Z6836 Body mass index (BMI) 36.0-36.9, adult: Secondary | ICD-10-CM

## 2018-09-30 DIAGNOSIS — Z01419 Encounter for gynecological examination (general) (routine) without abnormal findings: Secondary | ICD-10-CM | POA: Diagnosis not present

## 2018-09-30 DIAGNOSIS — N182 Chronic kidney disease, stage 2 (mild): Secondary | ICD-10-CM | POA: Diagnosis not present

## 2018-09-30 DIAGNOSIS — M79672 Pain in left foot: Secondary | ICD-10-CM | POA: Diagnosis not present

## 2018-09-30 DIAGNOSIS — I129 Hypertensive chronic kidney disease with stage 1 through stage 4 chronic kidney disease, or unspecified chronic kidney disease: Secondary | ICD-10-CM | POA: Diagnosis not present

## 2018-09-30 DIAGNOSIS — E1122 Type 2 diabetes mellitus with diabetic chronic kidney disease: Secondary | ICD-10-CM

## 2018-09-30 DIAGNOSIS — Z1212 Encounter for screening for malignant neoplasm of rectum: Secondary | ICD-10-CM | POA: Diagnosis not present

## 2018-09-30 LAB — POCT URINALYSIS DIPSTICK
BILIRUBIN UA: NEGATIVE
GLUCOSE UA: NEGATIVE
Ketones, UA: NEGATIVE
Nitrite, UA: NEGATIVE
Protein, UA: NEGATIVE
SPEC GRAV UA: 1.025 (ref 1.010–1.025)
Urobilinogen, UA: 0.2 E.U./dL
pH, UA: 5.5 (ref 5.0–8.0)

## 2018-09-30 LAB — POCT UA - MICROALBUMIN
Albumin/Creatinine Ratio, Urine, POC: 300
Creatinine, POC: 300 mg/dL
MICROALBUMIN (UR) POC: 80 mg/L

## 2018-09-30 NOTE — Progress Notes (Signed)
poc

## 2018-09-30 NOTE — Patient Instructions (Signed)

## 2018-10-01 LAB — CMP14+EGFR
ALBUMIN: 4.4 g/dL (ref 3.6–4.8)
ALT: 19 IU/L (ref 0–32)
AST: 18 IU/L (ref 0–40)
Albumin/Globulin Ratio: 1.6 (ref 1.2–2.2)
Alkaline Phosphatase: 133 IU/L — ABNORMAL HIGH (ref 39–117)
BUN / CREAT RATIO: 14 (ref 12–28)
BUN: 15 mg/dL (ref 8–27)
Bilirubin Total: 0.5 mg/dL (ref 0.0–1.2)
CO2: 24 mmol/L (ref 20–29)
CREATININE: 1.04 mg/dL — AB (ref 0.57–1.00)
Calcium: 9.3 mg/dL (ref 8.7–10.3)
Chloride: 101 mmol/L (ref 96–106)
GFR calc Af Amer: 66 mL/min/{1.73_m2} (ref >59)
GFR calc non Af Amer: 57 mL/min/{1.73_m2} — ABNORMAL LOW (ref >59)
GLOBULIN, TOTAL: 2.8 g/dL (ref 1.5–4.5)
Glucose: 101 mg/dL — ABNORMAL HIGH (ref 65–99)
Potassium: 3.8 mmol/L (ref 3.5–5.2)
SODIUM: 140 mmol/L (ref 134–144)
Total Protein: 7.2 g/dL (ref 6.0–8.5)

## 2018-10-01 LAB — CYTOLOGY - PAP
Diagnosis: NEGATIVE
HPV: NOT DETECTED

## 2018-10-01 LAB — LIPID PANEL
CHOL/HDL RATIO: 2.3 ratio (ref 0.0–4.4)
Cholesterol, Total: 173 mg/dL (ref 100–199)
HDL: 76 mg/dL (ref >39)
LDL CALC: 75 mg/dL (ref 0–99)
Triglycerides: 108 mg/dL (ref 0–149)
VLDL CHOLESTEROL CAL: 22 mg/dL (ref 5–40)

## 2018-10-01 LAB — CBC
HEMATOCRIT: 41 % (ref 34.0–46.6)
Hemoglobin: 13.6 g/dL (ref 11.1–15.9)
MCH: 28.2 pg (ref 26.6–33.0)
MCHC: 33.2 g/dL (ref 31.5–35.7)
MCV: 85 fL (ref 79–97)
PLATELETS: 314 10*3/uL (ref 150–450)
RBC: 4.82 x10E6/uL (ref 3.77–5.28)
RDW: 13.9 % (ref 11.7–15.4)
WBC: 8.3 10*3/uL (ref 3.4–10.8)

## 2018-10-01 LAB — HIV ANTIBODY (ROUTINE TESTING W REFLEX): HIV SCREEN 4TH GENERATION: NONREACTIVE

## 2018-10-01 LAB — HEMOGLOBIN A1C
ESTIMATED AVERAGE GLUCOSE: 148 mg/dL
HEMOGLOBIN A1C: 6.8 % — AB (ref 4.8–5.6)

## 2018-10-01 NOTE — Progress Notes (Signed)
Here are your lab results:  You are HIV negative.  Your kidney function is stable. Be sure to stay well hydrated.  Your liver function is normal.  Your blood count is normal.  Your cholesterol is normal.   Your hba1c is 6.8, this is pretty good. Our goal is less than 6.0.  Be sure to exercise at least 30 minutes five days weekly.   Let me know if you have any questions.   Sincerely,    Gad Aymond N. Allyne Gee, MD

## 2018-10-10 ENCOUNTER — Encounter: Payer: Self-pay | Admitting: Internal Medicine

## 2018-10-13 NOTE — Progress Notes (Signed)
Subjective:     Patient ID: Monica Ryan , female    DOB: 02/13/1956 , 63 y.o.   MRN: [id number]   Chief Complaint  Patient presents with  . Annual Exam  . Diabetes  . Hypertension    HPI  She is here today for a full physical examination. She would like to have a pap smear today.   Diabetes  She presents for her follow-up diabetic visit. She has type 2 diabetes mellitus. Her disease course has been stable. There are no hypoglycemic associated symptoms. Pertinent negatives for diabetes include no blurred vision and no chest pain. There are no hypoglycemic complications. Risk factors for coronary artery disease include diabetes mellitus, dyslipidemia and hypertension.  Hypertension  This is a chronic problem. The current episode started more than 1 year ago. The problem is unchanged. Pertinent negatives include no blurred vision or chest pain.   She reports compliance with meds.  Past Medical History:  Diagnosis Date  . Diabetes mellitus without complication ([place])   . Hyperlipemia   . Hypertension      Family History  Problem Relation Age of Onset  . Diabetes Mother   . Alzheimer's disease Father   . Diabetes Sister   . Diabetes Brother      Current Outpatient Medications:  .  ACCU-CHEK FASTCLIX LANCETS MISC, by Does not apply route., Disp: , Rfl:  .  aspirin EC 81 MG tablet, Take 81 mg by mouth daily., Disp: , Rfl:  .  FARXIGA 10 MG TABS tablet, TAKE 1 TABLET BY MOUTH EVERY MORNING, Disp: 30 tablet, Rfl: 5 .  glucose blood (ACCU-CHEK SMARTVIEW) test strip, 1 each by Other route as needed for other. Use as instructed, Disp: , Rfl:  .  lisinopril (PRINIVIL,ZESTRIL) 20 MG tablet, Take 20 mg by mouth daily., Disp: , Rfl:  .  Semaglutide, 1 MG/DOSE, (OZEMPIC, 1 MG/DOSE,) 2 MG/1.5ML SOPN, Inject into the skin., Disp: , Rfl:  .  simvastatin (ZOCOR) 40 MG tablet, Take 40 mg by mouth every evening., Disp: , Rfl:    No Known Allergies    No LMP recorded. Patient is  postmenopausal.. . Negative for: breast discharge, breast lump(s), breast pain and breast self exam. Associated symptoms include abnormal vaginal bleeding. Pertinent negatives include abnormal bleeding (hematology), anxiety, decreased libido, depression, difficulty falling sleep, dyspareunia, history of infertility, nocturia, sexual dysfunction, sleep disturbances, urinary incontinence, urinary urgency, vaginal discharge and vaginal itching. Diet regular.The patient states her exercise level is    . The patient's tobacco use is:  Social History   Tobacco Use  Smoking Status Never Smoker  Smokeless Tobacco Never Used  . She has been exposed to passive smoke. The patient's alcohol use is:  Social History   Substance and Sexual Activity  Alcohol Use Yes   Comment: occ  . Additional information: she would like to have a pap smear today.   Review of Systems  Constitutional: Negative.   HENT: Negative.   Eyes: Negative.  Negative for blurred vision.  Respiratory: Negative.   Cardiovascular: Negative.  Negative for chest pain.  Gastrointestinal: Negative.   Endocrine: Negative.   Genitourinary: Negative.   Musculoskeletal: Positive for arthralgias (she c/o L ankle pain. there is some pain w/ ambulation. she denies fall/trauma.).  Skin: Negative.   Allergic/Immunologic: Negative.   Neurological: Negative.   Hematological: Negative.   Psychiatric/Behavioral: Negative.      Today's Vitals   09/30/18 0846  BP: 124/80  Pulse: 83  Temp: 97.6 F (  36.4 C)  Weight: 192 lb 9.6 oz (87.4 kg)  Height: _0  (1.549 m)   Body mass index is 36.39 kg/m.   Objective:  Physical Exam Vitals signs and nursing note reviewed. Exam conducted with a chaperone present.  Constitutional:      Appearance: Normal appearance. She is obese.  HENT:     Head: Normocephalic and atraumatic.     Right Ear: Tympanic membrane, ear canal and external ear normal.     Left Ear: Tympanic membrane, ear canal and  external ear normal.     Nose: Nose normal.     Mouth/Throat:     Mouth: Mucous membranes are moist.     Pharynx: Oropharynx is clear.  Eyes:     Extraocular Movements: Extraocular movements intact.     Conjunctiva/sclera: Conjunctivae normal.     Pupils: Pupils are equal, round, and reactive to light.  Neck:     Musculoskeletal: Normal range of motion and neck supple.  Cardiovascular:     Rate and Rhythm: Normal rate and regular rhythm.     Pulses: Normal pulses.     Heart sounds: Normal heart sounds.  Pulmonary:     Effort: Pulmonary effort is normal.     Breath sounds: Normal breath sounds.  Abdominal:     General: Abdomen is flat. Bowel sounds are normal.     Hernia: There is no hernia in the right inguinal area or left inguinal area.  Genitourinary:    General: Normal vulva.     Exam position: Lithotomy position.     Vagina: Normal.     Cervix: Normal.     Uterus: Normal.      Adnexa: Right adnexa normal and left adnexa normal.     Rectum: Normal. Guaiac result negative.  Musculoskeletal: Normal range of motion.     Right foot: Normal range of motion. No deformity, bunion or Charcot foot.     Left foot: Normal range of motion. No deformity, bunion or Charcot foot.  Feet:     Right foot:     Protective Sensation: 5 sites tested. 5 sites sensed.     Left foot:     Protective Sensation: 5 sites tested. 5 sites sensed.  Lymphadenopathy:     Lower Body: No right inguinal adenopathy. No left inguinal adenopathy.  Skin:    General: Skin is warm and dry.  Neurological:     General: No focal deficit present.     Mental Status: She is alert.  Psychiatric:        Mood and Affect: Mood normal.         Assessment And Plan:     1. Routine general medical examination at health care facility  A full exam was performed.  Importance of monthly self breast exams was discussed with the patient.  PATIENT HAS BEEN ADVISED TO GET 30-45 MINUTES REGULAR EXERCISE NO LESS THAN FOUR TO  FIVE DAYS PER WEEK - BOTH WEIGHTBEARING EXERCISES AND AEROBIC ARE RECOMMENDED.  SHE IS ADVISED TO FOLLOW A HEALTHY DIET WITH AT LEAST SIX FRUITS/VEGGIES PER DAY, DECREASE INTAKE OF RED MEAT, AND TO INCREASE FISH INTAKE TO TWO DAYS PER WEEK.  MEATS/FISH SHOULD NOT BE FRIED, BAKED OR BROILED IS PREFERABLE.  I SUGGEST WEARING SPF 50 SUNSCREEN ON EXPOSED PARTS AND ESPECIALLY WHEN IN THE DIRECT SUNLIGHT FOR AN EXTENDED PERIOD OF TIME.  PLEASE AVOID FAST FOOD RESTAURANTS AND INCREASE YOUR WATER INTAKE.  - HIV antibody (with reflex) - Cytology -Pap Smear -  CMP14+EGFR - CBC - Lipid panel - Hemoglobin A1c  2. Encntr for gyn exam (general) (routine) w/o abn findings  Pap smear was performed.  Stool is heme negative.   3. Type 2 diabetes mellitus with stage 2 chronic kidney disease, without long-term current use of insulin ([place])  Diabetic foot exam was performed. I DISCUSSED WITH THE PATIENT AT LENGTH REGARDING THE GOALS OF GLYCEMIC CONTROL AND POSSIBLE LONG-TERM COMPLICATIONS.  I  ALSO STRESSED THE IMPORTANCE OF COMPLIANCE WITH HOME GLUCOSE MONITORING, DIETARY RESTRICTIONS INCLUDING AVOIDANCE OF SUGARY DRINKS/PROCESSED FOODS,  ALONG WITH REGULAR EXERCISE.  I  ALSO STRESSED THE IMPORTANCE OF ANNUAL EYE EXAMS, SELF FOOT CARE AND COMPLIANCE WITH OFFICE VISITS.  - POCT Urinalysis Dipstick (81002) - POCT UA - Microalbumin  4. Hypertensive nephropathy  Well controlled. She will continue with current meds. She is encouraged to avoid adding salt to her foods.   - EKG 12-Lead  5. Left foot pain  She declined podiatry evaluation at this time. She will let me know if her sx persist.   6. Class 2 severe obesity due to excess calories with serious comorbidity and body mass index (BMI) of 36.0 to 36.9 in adult [facility])  She is encouraged to strive for BMI less than 30 to decrease cardiac risk. She is encouraged to exercise 30 minutes five days weekly.   [name], MD

## 2018-10-15 ENCOUNTER — Encounter: Payer: Self-pay | Admitting: Internal Medicine

## 2018-10-16 ENCOUNTER — Telehealth: Payer: Self-pay

## 2018-10-16 MED ORDER — SEMAGLUTIDE (1 MG/DOSE) 2 MG/1.5ML ~~LOC~~ SOPN: 1.0000 mg | PEN_INJECTOR | SUBCUTANEOUS | 2 refills | Status: DC

## 2018-10-16 NOTE — Telephone Encounter (Signed)
error 

## 2018-10-24 ENCOUNTER — Other Ambulatory Visit: Payer: Self-pay | Admitting: Internal Medicine

## 2018-11-08 ENCOUNTER — Other Ambulatory Visit: Payer: Self-pay | Admitting: Internal Medicine

## 2018-12-30 ENCOUNTER — Ambulatory Visit: Payer: BC Managed Care – PPO | Admitting: Internal Medicine

## 2019-01-06 ENCOUNTER — Other Ambulatory Visit: Payer: Self-pay | Admitting: Internal Medicine

## 2019-01-13 ENCOUNTER — Other Ambulatory Visit: Payer: Self-pay

## 2019-01-13 ENCOUNTER — Ambulatory Visit: Payer: BC Managed Care – PPO | Admitting: Internal Medicine

## 2019-01-13 ENCOUNTER — Encounter: Payer: Self-pay | Admitting: Internal Medicine

## 2019-01-13 VITALS — BP 132/74 | HR 69 | Temp 97.7°F | Ht 61.2 in | Wt 191.8 lb

## 2019-01-13 DIAGNOSIS — E1122 Type 2 diabetes mellitus with diabetic chronic kidney disease: Secondary | ICD-10-CM | POA: Diagnosis not present

## 2019-01-13 DIAGNOSIS — N182 Chronic kidney disease, stage 2 (mild): Secondary | ICD-10-CM | POA: Diagnosis not present

## 2019-01-13 DIAGNOSIS — G8929 Other chronic pain: Secondary | ICD-10-CM

## 2019-01-13 DIAGNOSIS — I129 Hypertensive chronic kidney disease with stage 1 through stage 4 chronic kidney disease, or unspecified chronic kidney disease: Secondary | ICD-10-CM

## 2019-01-13 DIAGNOSIS — M25511 Pain in right shoulder: Secondary | ICD-10-CM | POA: Diagnosis not present

## 2019-01-13 DIAGNOSIS — M25512 Pain in left shoulder: Secondary | ICD-10-CM

## 2019-01-13 DIAGNOSIS — Z6836 Body mass index (BMI) 36.0-36.9, adult: Secondary | ICD-10-CM

## 2019-01-13 LAB — BMP8+EGFR
BUN/Creatinine Ratio: 14 (ref 12–28)
BUN: 14 mg/dL (ref 8–27)
CO2: 25 mmol/L (ref 20–29)
Calcium: 9.5 mg/dL (ref 8.7–10.3)
Chloride: 104 mmol/L (ref 96–106)
Creatinine, Ser: 1.02 mg/dL — ABNORMAL HIGH (ref 0.57–1.00)
GFR calc Af Amer: 67 mL/min/{1.73_m2} (ref >59)
GFR calc non Af Amer: 58 mL/min/{1.73_m2} — ABNORMAL LOW (ref >59)
Glucose: 112 mg/dL — ABNORMAL HIGH (ref 65–99)
Potassium: 4.1 mmol/L (ref 3.5–5.2)
Sodium: 143 mmol/L (ref 134–144)

## 2019-01-13 LAB — HEMOGLOBIN A1C
Est. average glucose Bld gHb Est-mCnc: 151 mg/dL
Hgb A1c MFr Bld: 6.9 % — ABNORMAL HIGH (ref 4.8–5.6)

## 2019-01-13 NOTE — Progress Notes (Signed)
Subjective:     Patient ID: Monica Ryan , female    DOB: 1955/12/09 , 63 y.o.   MRN: [id number]   Chief Complaint  Patient presents with  . Diabetes  . Hypertension    HPI  Diabetes  She presents for her follow-up diabetic visit. She has type 2 diabetes mellitus. Her disease course has been stable. There are no hypoglycemic associated symptoms. Pertinent negatives for diabetes include no blurred vision and no chest pain. There are no hypoglycemic complications. Risk factors for coronary artery disease include diabetes mellitus, dyslipidemia, hypertension and post-menopausal. She is following a diabetic diet. She participates in exercise three times a week. Her breakfast blood glucose is taken between 7-8 am. Her breakfast blood glucose range is generally 90-110 mg/dl. An ACE inhibitor/angiotensin II receptor blocker is being taken. Eye exam is current.  Hypertension  This is a chronic problem. The current episode started more than 1 year ago. The problem is controlled. Pertinent negatives include no blurred vision, chest pain, palpitations or shortness of breath.   She reports compliance with meds.   Past Medical History:  Diagnosis Date  . Diabetes mellitus without complication ([place])   . Hyperlipemia   . Hypertension      Family History  Problem Relation Age of Onset  . Diabetes Mother   . Alzheimer's disease Father   . Diabetes Sister   . Diabetes Brother      Current Outpatient Medications:  .  ACCU-CHEK FASTCLIX LANCETS MISC, by Does not apply route., Disp: , Rfl:  .  aspirin EC 81 MG tablet, Take 81 mg by mouth daily., Disp: , Rfl:  .  FARXIGA 10 MG TABS tablet, TAKE 1 TABLET BY MOUTH EVERY MORNING, Disp: 30 tablet, Rfl: 5 .  glucose blood (ACCU-CHEK SMARTVIEW) test strip, 1 each by Other route as needed for other. Use as instructed, Disp: , Rfl:  .  lisinopril (PRINIVIL,ZESTRIL) 20 MG tablet, Take 20 mg by mouth daily., Disp: , Rfl:  .   lisinopril-hydrochlorothiazide (PRINZIDE,ZESTORETIC) 20-12.5 MG tablet, TAKE 1 TABLET BY MOUTH EVERY DAY, Disp: 90 tablet, Rfl: 2 .  Semaglutide, 1 MG/DOSE, 2 MG/1.5ML SOPN, INJECT 1 MG SQ EVERY WEEK ON THE SAME DAY OF EACH WEEK IN ABDOMEN,THIGH,UPPER ARM,ROTATE SITES, Disp: 3 pen, Rfl: 1 .  simvastatin (ZOCOR) 40 MG tablet, TAKE 1 TABLET BY MOUTH EVERY DAY, Disp: 90 tablet, Rfl: 2   No Known Allergies   Review of Systems  Constitutional: Negative.   Eyes: Negative for blurred vision.  Respiratory: Negative.  Negative for shortness of breath.   Cardiovascular: Negative.  Negative for chest pain and palpitations.  Gastrointestinal: Negative.   Musculoskeletal: Positive for arthralgias (she c/o bilateral shoulder pain. Aggravated by doing yard work. Has been seen by Ortho in the past. ).  Neurological: Negative.   Psychiatric/Behavioral: Negative.      Today's Vitals   01/13/19 0900  BP: 132/74  Pulse: 69  Temp: 97.7 F (36.5 C)  TempSrc: Oral  Weight: 191 lb 12.8 oz (87 kg)  Height: 5' 1.2" (1.554 m)   Body mass index is 36 kg/m.   Objective:  Physical Exam Vitals signs and nursing note reviewed.  Constitutional:      Appearance: Normal appearance.  HENT:     Head: Normocephalic and atraumatic.  Cardiovascular:     Rate and Rhythm: Normal rate and regular rhythm.     Heart sounds: Normal heart sounds.  Pulmonary:     Effort: Pulmonary effort is  normal.     Breath sounds: Normal breath sounds.  Skin:    General: Skin is warm.  Neurological:     General: No focal deficit present.     Mental Status: She is alert.  Psychiatric:        Mood and Affect: Mood normal.        Behavior: Behavior normal.         Assessment And Plan:     1. Type 2 diabetes mellitus with stage 2 chronic kidney disease, without long-term current use of insulin ([place])  I will check labs as listed below. She is encouraged to exercise no less than five days weekly for at least 30 minutes. She  was congratulated on lifestyle changes she has made.   - BMP8+EGFR - Hemoglobin A1c  2. Hypertensive nephropathy  Controlled. She is aware optimal bp is less than 130/80.  She is encouraged to avoid adding salt to her foods.    3. Chronic pain of both shoulders  Note from Ortho dated 1/19 was reviewed in full detail. It is thought that she has rotator cuff pathology. She will continue with meloxicam prn.  She does not wish to have Ortho referral at this time.   4. Class 2 severe obesity due to excess calories with serious comorbidity and body mass index (BMI) of 36.0 to 36.9 in adult [facility])  Importance of achieving optimal weight to decrease risk of cardiovascular disease and cancers was discussed with the patient in full detail. She is encouraged to start slowly - start with 10 minutes twice daily at least three to four days per week and to gradually build to 30 minutes five days weekly. She was given tips to incorporate more activity into her daily routine - take stairs when possible, park farther away from grocery stores, etc.      [name], MD    THE PATIENT IS ENCOURAGED TO PRACTICE SOCIAL DISTANCING DUE TO THE COVID-19 PANDEMIC.

## 2019-01-13 NOTE — Patient Instructions (Signed)
Shoulder Pain Many things can cause shoulder pain, including:  An injury.  Moving the shoulder in the same way again and again (overuse).  Joint pain (arthritis). Pain can come from:  Swelling and irritation (inflammation) of any part of the shoulder.  An injury to the shoulder joint.  An injury to: ? Tissues that connect muscle to bone (tendons). ? Tissues that connect bones to each other (ligaments). ? Bones. Follow these instructions at home: Watch for changes in your symptoms. Let your doctor know about them. Follow these instructions to help with your pain. If you have a sling:  Wear the sling as told by your doctor. Remove it only as told by your doctor.  Loosen the sling if your fingers: ? Tingle. ? Become numb. ? Turn cold and blue.  Keep the sling clean.  If the sling is not waterproof: ? Do not let it get wet. ? Take the sling off when you shower or bathe. Managing pain, stiffness, and swelling   If told, put ice on the painful area: ? Put ice in a plastic bag. ? Place a towel between your skin and the bag. ? Leave the ice on for 20 minutes, 2-3 times a day. Stop putting ice on if it does not help with the pain.  Squeeze a soft ball or a foam pad as much as possible. This prevents swelling in the shoulder. It also helps to strengthen the arm. General instructions  Take over-the-counter and prescription medicines only as told by your doctor.  Keep all follow-up visits as told by your doctor. This is important. Contact a doctor if:  Your pain gets worse.  Medicine does not help your pain.  You have new pain in your arm, hand, or fingers. Get help right away if:  Your arm, hand, or fingers: ? Tingle. ? Are numb. ? Are swollen. ? Are painful. ? Turn white or blue. Summary  Shoulder pain can be caused by many things. These include injury, moving the shoulder in the same away again and again, and joint pain.  Watch for changes in your symptoms.  Let your doctor know about them.  This condition may be treated with a sling, ice, and pain medicine.  Contact your doctor if the pain gets worse or you have new pain. Get help right away if your arm, hand, or fingers tingle or get numb, swollen, or painful.  Keep all follow-up visits as told by your doctor. This is important. This information is not intended to replace advice given to you by your health care provider. Make sure you discuss any questions you have with your health care provider. Document Released: 02/27/2008 Document Revised: 03/25/2018 Document Reviewed: 03/25/2018 Elsevier Interactive Patient Education  2019 [employer]  

## 2019-01-28 ENCOUNTER — Other Ambulatory Visit: Payer: Self-pay | Admitting: Internal Medicine

## 2019-03-02 ENCOUNTER — Other Ambulatory Visit: Payer: Self-pay | Admitting: Internal Medicine

## 2019-03-02 DIAGNOSIS — Z1231 Encounter for screening mammogram for malignant neoplasm of breast: Secondary | ICD-10-CM

## 2019-03-03 ENCOUNTER — Other Ambulatory Visit: Payer: Self-pay | Admitting: Internal Medicine

## 2019-04-14 ENCOUNTER — Encounter: Payer: Self-pay | Admitting: Internal Medicine

## 2019-04-14 ENCOUNTER — Other Ambulatory Visit: Payer: Self-pay

## 2019-04-14 ENCOUNTER — Ambulatory Visit: Payer: BC Managed Care – PPO | Admitting: Internal Medicine

## 2019-04-14 VITALS — BP 128/78 | HR 74 | Temp 98.1°F | Ht 61.0 in | Wt 187.0 lb

## 2019-04-14 DIAGNOSIS — E7800 Pure hypercholesterolemia, unspecified: Secondary | ICD-10-CM | POA: Diagnosis not present

## 2019-04-14 DIAGNOSIS — E1122 Type 2 diabetes mellitus with diabetic chronic kidney disease: Secondary | ICD-10-CM | POA: Diagnosis not present

## 2019-04-14 DIAGNOSIS — N182 Chronic kidney disease, stage 2 (mild): Secondary | ICD-10-CM

## 2019-04-14 DIAGNOSIS — I129 Hypertensive chronic kidney disease with stage 1 through stage 4 chronic kidney disease, or unspecified chronic kidney disease: Secondary | ICD-10-CM | POA: Diagnosis not present

## 2019-04-14 DIAGNOSIS — E66812 Obesity, class 2: Secondary | ICD-10-CM

## 2019-04-14 DIAGNOSIS — Z6835 Body mass index (BMI) 35.0-35.9, adult: Secondary | ICD-10-CM

## 2019-04-14 MED ORDER — LISINOPRIL-HYDROCHLOROTHIAZIDE 20-12.5 MG PO TABS: 1.0000 | ORAL_TABLET | Freq: Every day | ORAL | 2 refills | Status: DC

## 2019-04-14 NOTE — Patient Instructions (Signed)

## 2019-04-14 NOTE — Progress Notes (Signed)
Subjective:     Patient ID: Monica Ryan , female    DOB: [birth year] , 63 y.o.   MRN: [id number]   Chief Complaint  Patient presents with  . Diabetes  . Hypertension    HPI  Diabetes She presents for her follow-up diabetic visit. She has type 2 diabetes mellitus. Her disease course has been stable. There are no hypoglycemic associated symptoms. Pertinent negatives for diabetes include no blurred vision and no chest pain. There are no hypoglycemic complications. Risk factors for coronary artery disease include diabetes mellitus, dyslipidemia, hypertension and post-menopausal. She is following a diabetic diet. She participates in exercise three times a week. Her breakfast blood glucose is taken between 7-8 am. Her breakfast blood glucose range is generally 90-110 mg/dl. An ACE inhibitor/angiotensin II receptor blocker is being taken. Eye exam is current.  Hypertension This is a chronic problem. The current episode started more than 1 year ago. The problem is controlled. Pertinent negatives include no blurred vision, chest pain, palpitations or shortness of breath. Past treatments include ACE inhibitors and diuretics. Hypertensive end-organ damage includes kidney disease.     Past Medical History:  Diagnosis Date  . Diabetes mellitus without complication ([place])   . Hyperlipemia   . Hypertension      Family History  Problem Relation Age of Onset  . Diabetes Mother   . Alzheimer's disease Father   . Diabetes Sister   . Diabetes Brother      Current Outpatient Medications:  .  ACCU-CHEK FASTCLIX LANCETS MISC, by Does not apply route., Disp: , Rfl:  .  aspirin EC 81 MG tablet, Take 81 mg by mouth daily., Disp: , Rfl:  .  FARXIGA 10 MG TABS tablet, TAKE 1 TABLET BY MOUTH EVERY MORNING, Disp: 30 tablet, Rfl: 5 .  glucose blood (ACCU-CHEK SMARTVIEW) test strip, 1 each by Other route as needed for other. Use as instructed, Disp: , Rfl:  .  lisinopril-hydrochlorothiazide (ZESTORETIC)  20-12.5 MG tablet, Take 1 tablet by mouth daily., Disp: 90 tablet, Rfl: 2 .  OZEMPIC, 1 MG/DOSE, 2 MG/1.5ML SOPN, INJECT 1 MG INTO THE SKIN ONCE A WEEK., Disp: 3 pen, Rfl: 2 .  simvastatin (ZOCOR) 40 MG tablet, TAKE 1 TABLET BY MOUTH EVERY DAY, Disp: 90 tablet, Rfl: 2 .  lisinopril (PRINIVIL,ZESTRIL) 20 MG tablet, Take 20 mg by mouth daily., Disp: , Rfl:    No Known Allergies   Review of Systems  Constitutional: Negative.   Eyes: Negative for blurred vision.  Respiratory: Negative.  Negative for shortness of breath.   Cardiovascular: Negative.  Negative for chest pain and palpitations.  Gastrointestinal: Negative.   Neurological: Negative.   Psychiatric/Behavioral: Negative.      Today's Vitals   04/14/19 1422  BP: 128/78  Pulse: 74  Temp: 98.1 F (36.7 C)  TempSrc: Oral  SpO2: 100%  Weight: 187 lb (84.8 kg)  Height: _0  (1.549 m)   Body mass index is 35.33 kg/m.   Objective:  Physical Exam Vitals signs and nursing note reviewed.  Constitutional:      Appearance: Normal appearance.  HENT:     Head: Normocephalic and atraumatic.  Cardiovascular:     Rate and Rhythm: Normal rate and regular rhythm.     Heart sounds: Normal heart sounds.  Pulmonary:     Effort: Pulmonary effort is normal.     Breath sounds: Normal breath sounds.  Skin:    General: Skin is warm.  Neurological:     General:  No focal deficit present.     Mental Status: She is alert.  Psychiatric:        Mood and Affect: Mood normal.        Behavior: Behavior normal.         Assessment And Plan:     1. Type 2 diabetes mellitus with stage 2 chronic kidney disease, without long-term current use of insulin ([place])  I will check labs as listed below. She is encouraged to exercise no less than 150 minutes per week.   - Lipid panel - CMP14+EGFR - Hemoglobin A1c  2. Hypertensive nephropathy  Well controlled. She will continue with current meds. She is encouraged to avoid adding salt to her foods.    3. Pure hypercholesterolemia  Chronic. She is encouraged to avoid fried foods, take statin regularly and limit her meat intake. I will check non-fasting lipid panel today.   4. Class 2 severe obesity due to excess calories with serious comorbidity and body mass index (BMI) of 35.0 to 35.9 in adult [facility])   Importance of achieving optimal weight to decrease risk of cardiovascular disease and cancers was discussed with the patient in full detail. She is encouraged to start slowly - start with 10 minutes twice daily at least three to four days per week and to gradually build to 30 minutes five days weekly. She was given tips to incorporate more activity into her daily routine - take stairs when possible, park farther away from grocery stores, etc.    [name], MD    THE PATIENT IS ENCOURAGED TO PRACTICE SOCIAL DISTANCING DUE TO THE COVID-19 PANDEMIC.

## 2019-04-15 LAB — CMP14+EGFR
ALT: 17 IU/L (ref 0–32)
AST: 18 IU/L (ref 0–40)
Albumin/Globulin Ratio: 1.6 (ref 1.2–2.2)
Albumin: 4.4 g/dL (ref 3.8–4.8)
Alkaline Phosphatase: 115 IU/L (ref 39–117)
BUN/Creatinine Ratio: 17 (ref 12–28)
BUN: 16 mg/dL (ref 8–27)
Bilirubin Total: 0.5 mg/dL (ref 0.0–1.2)
CO2: 24 mmol/L (ref 20–29)
Calcium: 9.6 mg/dL (ref 8.7–10.3)
Chloride: 102 mmol/L (ref 96–106)
Creatinine, Ser: 0.96 mg/dL (ref 0.57–1.00)
GFR calc Af Amer: 72 mL/min/{1.73_m2} (ref >59)
GFR calc non Af Amer: 63 mL/min/{1.73_m2} (ref >59)
Globulin, Total: 2.7 g/dL (ref 1.5–4.5)
Glucose: 85 mg/dL (ref 65–99)
Potassium: 3.6 mmol/L (ref 3.5–5.2)
Sodium: 143 mmol/L (ref 134–144)
Total Protein: 7.1 g/dL (ref 6.0–8.5)

## 2019-04-15 LAB — HEMOGLOBIN A1C
Est. average glucose Bld gHb Est-mCnc: 148 mg/dL
Hgb A1c MFr Bld: 6.8 % — ABNORMAL HIGH (ref 4.8–5.6)

## 2019-04-15 LAB — LIPID PANEL
Chol/HDL Ratio: 2.4 ratio (ref 0.0–4.4)
Cholesterol, Total: 167 mg/dL (ref 100–199)
HDL: 70 mg/dL (ref >39)
LDL Calculated: 72 mg/dL (ref 0–99)
Triglycerides: 125 mg/dL (ref 0–149)
VLDL Cholesterol Cal: 25 mg/dL (ref 5–40)

## 2019-04-16 ENCOUNTER — Other Ambulatory Visit: Payer: Self-pay

## 2019-04-16 ENCOUNTER — Ambulatory Visit
Admission: RE | Admit: 2019-04-16 | Discharge: 2019-04-16 | Disposition: A | Discharge status: Home / Self Care | Payer: BC Managed Care – PPO | Source: Ambulatory Visit | Attending: Internal Medicine | Admitting: Internal Medicine

## 2019-04-16 DIAGNOSIS — Z1231 Encounter for screening mammogram for malignant neoplasm of breast: Secondary | ICD-10-CM

## 2019-05-30 ENCOUNTER — Other Ambulatory Visit: Payer: Self-pay | Admitting: Internal Medicine

## 2019-07-15 ENCOUNTER — Other Ambulatory Visit: Payer: Self-pay

## 2019-07-15 ENCOUNTER — Ambulatory Visit: Payer: BC Managed Care – PPO | Admitting: Internal Medicine

## 2019-07-15 ENCOUNTER — Encounter: Payer: Self-pay | Admitting: Internal Medicine

## 2019-07-15 VITALS — BP 138/70 | HR 81 | Temp 98.5°F | Ht 60.0 in | Wt 192.8 lb

## 2019-07-15 DIAGNOSIS — I129 Hypertensive chronic kidney disease with stage 1 through stage 4 chronic kidney disease, or unspecified chronic kidney disease: Secondary | ICD-10-CM | POA: Diagnosis not present

## 2019-07-15 DIAGNOSIS — N182 Chronic kidney disease, stage 2 (mild): Secondary | ICD-10-CM | POA: Diagnosis not present

## 2019-07-15 DIAGNOSIS — Z23 Encounter for immunization: Secondary | ICD-10-CM

## 2019-07-15 DIAGNOSIS — Z6837 Body mass index (BMI) 37.0-37.9, adult: Secondary | ICD-10-CM

## 2019-07-15 DIAGNOSIS — M542 Cervicalgia: Secondary | ICD-10-CM

## 2019-07-15 DIAGNOSIS — E1122 Type 2 diabetes mellitus with diabetic chronic kidney disease: Secondary | ICD-10-CM

## 2019-07-15 NOTE — Patient Instructions (Signed)

## 2019-07-15 NOTE — Progress Notes (Signed)
Subjective:     Patient ID: Monica Ryan , female    DOB: 10-May-1956 , 63 y.o.   MRN: [id number]   Chief Complaint  Patient presents with  . Diabetes  . Hypertension    HPI  Diabetes She presents for her follow-up diabetic visit. She has type 2 diabetes mellitus. Her disease course has been stable. There are no hypoglycemic associated symptoms. Pertinent negatives for diabetes include no blurred vision and no chest pain. There are no hypoglycemic complications. Risk factors for coronary artery disease include diabetes mellitus, dyslipidemia, hypertension and post-menopausal. She is following a diabetic diet. She participates in exercise three times a week. Her breakfast blood glucose is taken between 7-8 am. Her breakfast blood glucose range is generally 90-110 mg/dl. An ACE inhibitor/angiotensin II receptor blocker is being taken. Eye exam is current.  Hypertension This is a chronic problem. The current episode started more than 1 year ago. The problem is controlled. Associated symptoms include neck pain. Pertinent negatives include no blurred vision, chest pain, palpitations or shortness of breath. Past treatments include ACE inhibitors and diuretics. Hypertensive end-organ damage includes kidney disease.     Past Medical History:  Diagnosis Date  . Diabetes mellitus without complication (HCC)   . Hyperlipemia   . Hypertension      Family History  Problem Relation Age of Onset  . Diabetes Mother   . Alzheimer's disease Father   . Diabetes Sister   . Diabetes Brother      Current Outpatient Medications:  .  ACCU-CHEK FASTCLIX LANCETS MISC, by Does not apply route., Disp: , Rfl:  .  aspirin EC 81 MG tablet, Take 81 mg by mouth daily., Disp: , Rfl:  .  FARXIGA 10 MG TABS tablet, TAKE 1 TABLET BY MOUTH EVERY MORNING, Disp: 30 tablet, Rfl: 5 .  glucose blood (ACCU-CHEK SMARTVIEW) test strip, 1 each by Other route as needed for other. Use as instructed, Disp: , Rfl:  .   lisinopril-hydrochlorothiazide (ZESTORETIC) 20-12.5 MG tablet, Take 1 tablet by mouth daily., Disp: 90 tablet, Rfl: 2 .  OZEMPIC, 1 MG/DOSE, 2 MG/1.5ML SOPN, INJECT 1 MG INTO THE SKIN ONCE A WEEK., Disp: 9 pen, Rfl: 3 .  simvastatin (ZOCOR) 40 MG tablet, TAKE 1 TABLET BY MOUTH EVERY DAY, Disp: 90 tablet, Rfl: 2   No Known Allergies   Review of Systems  Constitutional: Negative.        She is frustrated that she has not lost more weight. She reports she has been walking more.   Eyes: Negative for blurred vision.  Respiratory: Negative.  Negative for shortness of breath.   Cardiovascular: Negative.  Negative for chest pain and palpitations.  Gastrointestinal: Negative.   Musculoskeletal: Positive for neck pain.       She c/o neck pain. She reports she was doing an exercise on [employer] and the pain occurred when she turned her head to the left. Pain is on the right side of her neck. She has not tried any OTC meds for relief.   Neurological: Negative.   Psychiatric/Behavioral: Negative.      Today's Vitals   07/15/19 0842  BP: 138/70  Pulse: 81  Temp: 98.5 F (36.9 C)  TempSrc: Oral  Weight: 192 lb 12.8 oz (87.5 kg)  Height: 5' (1.524 m)   Body mass index is 37.65 kg/m.   Objective:  Physical Exam Vitals signs and nursing note reviewed.  Constitutional:      Appearance: Normal appearance.  HENT:  Head: Normocephalic and atraumatic.  Neck:     Musculoskeletal: Pain with movement and muscular tenderness present.   Cardiovascular:     Rate and Rhythm: Normal rate and regular rhythm.     Heart sounds: Normal heart sounds.  Pulmonary:     Effort: Pulmonary effort is normal.     Breath sounds: Normal breath sounds.  Skin:    General: Skin is warm.  Neurological:     General: No focal deficit present.     Mental Status: She is alert.  Psychiatric:        Mood and Affect: Mood normal.        Behavior: Behavior normal.         Assessment And Plan:     1. Type 2  diabetes mellitus with stage 2 chronic kidney disease, without long-term current use of insulin (HCC)  I will check hba1c, bmp today. She is encouraged to continue with her regular exercise regimen.   2. Hypertensive nephropathy  Chronic, fair control. She will continue with current meds. She is encouraged to avoid adding salt to her foods.   3. Need for influenza vaccination  - Flu Vaccine QUAD 6+ mos PF IM (Fluarix Quad PF)  4. Class 2 severe obesity due to excess calories with serious comorbidity and body mass index (BMI) of 37.0 to 37.9 in adult [facility])  Importance of achieving optimal weight to decrease risk of cardiovascular disease and cancers was discussed with the patient in full detail. She is encouraged to start slowly - start with 10 minutes twice daily at least three to four days per week and to gradually build to 30 minutes five days weekly. She was given tips to incorporate more activity into her daily routine - take stairs when possible, park farther away from grocery stores, etc.   5. Neck pain  She is advised to apply topical pain cream nightly and to supplement with magnesium 250-400mg  nightly. She will let me know if her sx persist.    [name], MD    THE PATIENT IS ENCOURAGED TO PRACTICE SOCIAL DISTANCING DUE TO THE COVID-19 PANDEMIC.

## 2019-07-15 NOTE — Addendum Note (Signed)
Addended by: [name] on: 07/15/2019 04:56 PM   Modules accepted: Orders

## 2019-07-16 LAB — BMP8+EGFR
BUN/Creatinine Ratio: 21 (ref 12–28)
BUN: 21 mg/dL (ref 8–27)
CO2: 25 mmol/L (ref 20–29)
Calcium: 9.2 mg/dL (ref 8.7–10.3)
Chloride: 102 mmol/L (ref 96–106)
Creatinine, Ser: 0.99 mg/dL (ref 0.57–1.00)
GFR calc Af Amer: 70 mL/min/{1.73_m2} (ref >59)
GFR calc non Af Amer: 60 mL/min/{1.73_m2} (ref >59)
Glucose: 114 mg/dL — ABNORMAL HIGH (ref 65–99)
Potassium: 4.1 mmol/L (ref 3.5–5.2)
Sodium: 140 mmol/L (ref 134–144)

## 2019-07-16 LAB — HEMOGLOBIN A1C
Est. average glucose Bld gHb Est-mCnc: 148 mg/dL
Hgb A1c MFr Bld: 6.8 % — ABNORMAL HIGH (ref 4.8–5.6)

## 2019-07-17 ENCOUNTER — Other Ambulatory Visit: Payer: Self-pay | Admitting: Internal Medicine

## 2019-07-27 ENCOUNTER — Encounter: Payer: Self-pay | Admitting: Internal Medicine

## 2019-08-05 ENCOUNTER — Encounter: Payer: Self-pay | Admitting: Internal Medicine

## 2019-08-09 ENCOUNTER — Other Ambulatory Visit: Payer: Self-pay | Admitting: Cardiology

## 2019-08-09 DIAGNOSIS — Z20822 Contact with and (suspected) exposure to covid-19: Secondary | ICD-10-CM

## 2019-08-10 LAB — NOVEL CORONAVIRUS, NAA: SARS-CoV-2, NAA: NOT DETECTED

## 2019-08-29 ENCOUNTER — Other Ambulatory Visit: Payer: Self-pay | Admitting: Internal Medicine

## 2019-09-10 LAB — HM DIABETES EYE EXAM

## 2019-10-05 ENCOUNTER — Ambulatory Visit: Payer: BC Managed Care – PPO | Admitting: Internal Medicine

## 2019-10-05 ENCOUNTER — Encounter: Payer: Self-pay | Admitting: Internal Medicine

## 2019-10-05 ENCOUNTER — Other Ambulatory Visit: Payer: Self-pay

## 2019-10-05 VITALS — BP 114/68 | HR 81 | Temp 98.2°F | Ht 61.0 in | Wt 191.2 lb

## 2019-10-05 DIAGNOSIS — Z6836 Body mass index (BMI) 36.0-36.9, adult: Secondary | ICD-10-CM

## 2019-10-05 DIAGNOSIS — M722 Plantar fascial fibromatosis: Secondary | ICD-10-CM

## 2019-10-05 DIAGNOSIS — N182 Chronic kidney disease, stage 2 (mild): Secondary | ICD-10-CM

## 2019-10-05 DIAGNOSIS — I129 Hypertensive chronic kidney disease with stage 1 through stage 4 chronic kidney disease, or unspecified chronic kidney disease: Secondary | ICD-10-CM | POA: Diagnosis not present

## 2019-10-05 DIAGNOSIS — E1122 Type 2 diabetes mellitus with diabetic chronic kidney disease: Secondary | ICD-10-CM

## 2019-10-05 DIAGNOSIS — Z0000 Encounter for general adult medical examination without abnormal findings: Secondary | ICD-10-CM | POA: Diagnosis not present

## 2019-10-05 LAB — POCT UA - MICROALBUMIN
Albumin/Creatinine Ratio, Urine, POC: 30
Creatinine, POC: 100 mg/dL
Microalbumin Ur, POC: 10 mg/L

## 2019-10-05 LAB — POCT URINALYSIS DIPSTICK
Bilirubin, UA: NEGATIVE
Glucose, UA: POSITIVE — AB
Ketones, UA: NEGATIVE
Nitrite, UA: NEGATIVE
Protein, UA: NEGATIVE
Spec Grav, UA: 1.02 (ref 1.010–1.025)
Urobilinogen, UA: 0.2 E.U./dL
pH, UA: 5 (ref 5.0–8.0)

## 2019-10-05 MED ORDER — LISINOPRIL-HYDROCHLOROTHIAZIDE 20-12.5 MG PO TABS: 1.0000 | ORAL_TABLET | Freq: Every day | ORAL | 2 refills | Status: DC

## 2019-10-05 MED ORDER — ACCU-CHEK FASTCLIX LANCETS MISC: 2 refills | Status: DC

## 2019-10-05 MED ORDER — FARXIGA 10 MG PO TABS: ORAL_TABLET | ORAL | 2 refills | Status: DC

## 2019-10-05 MED ORDER — OZEMPIC (1 MG/DOSE) 2 MG/1.5ML ~~LOC~~ SOPN: 1.0000 mg | PEN_INJECTOR | SUBCUTANEOUS | 2 refills | Status: DC

## 2019-10-05 MED ORDER — ACCU-CHEK SMARTVIEW VI STRP: ORAL_STRIP | 2 refills | Status: DC

## 2019-10-05 MED ORDER — SIMVASTATIN 40 MG PO TABS: 40.0000 mg | ORAL_TABLET | Freq: Every day | ORAL | 2 refills | Status: DC

## 2019-10-05 NOTE — Patient Instructions (Addendum)
Voltaren gel - over the counter  Health Maintenance, Female Adopting a healthy lifestyle and getting preventive care are important in promoting health and wellness. Ask your health care provider about:  The right schedule for you to have regular tests and exams.  Things you can do on your own to prevent diseases and keep yourself healthy. What should I know about diet, weight, and exercise? Eat a healthy diet   Eat a diet that includes plenty of vegetables, fruits, low-fat dairy products, and lean protein.  Do not eat a lot of foods that are high in solid fats, added sugars, or sodium. Maintain a healthy weight Body mass index (BMI) is used to identify weight problems. It estimates body fat based on height and weight. Your health care provider can help determine your BMI and help you achieve or maintain a healthy weight. Get regular exercise Get regular exercise. This is one of the most important things you can do for your health. Most adults should:  Exercise for at least 150 minutes each week. The exercise should increase your heart rate and make you sweat (moderate-intensity exercise).  Do strengthening exercises at least twice a week. This is in addition to the moderate-intensity exercise.  Spend less time sitting. Even light physical activity can be beneficial. Watch cholesterol and blood lipids Have your blood tested for lipids and cholesterol at 65 years of age, then have this test every 5 years. Have your cholesterol levels checked more often if:  Your lipid or cholesterol levels are high.  You are older than 65 years of age.  You are at high risk for heart disease. What should I know about cancer screening? Depending on your health history and family history, you may need to have cancer screening at various ages. This may include screening for:  Breast cancer.  Cervical cancer.  Colorectal cancer.  Skin cancer.  Lung cancer. What should I know about heart  disease, diabetes, and high blood pressure? Blood pressure and heart disease  High blood pressure causes heart disease and increases the risk of stroke. This is more likely to develop in people who have high blood pressure readings, are of African descent, or are overweight.  Have your blood pressure checked: ? Every 3-5 years if you are 65-46 years of age. ? Every year if you are 12 years old or older. Diabetes Have regular diabetes screenings. This checks your fasting blood sugar level. Have the screening done:  Once every three years after age 65 if you are at a normal weight and have a low risk for diabetes.  More often and at a younger age if you are overweight or have a high risk for diabetes. What should I know about preventing infection? Hepatitis B If you have a higher risk for hepatitis B, you should be screened for this virus. Talk with your health care provider to find out if you are at risk for hepatitis B infection. Hepatitis C Testing is recommended for:  Everyone born from [birth year] through 1966.  Anyone with known risk factors for hepatitis C. Sexually transmitted infections (STIs)  Get screened for STIs, including gonorrhea and chlamydia, if: ? You are sexually active and are younger than 65 years of age. ? You are older than 65 years of age and your health care provider tells you that you are at risk for this type of infection. ? Your sexual activity has changed since you were last screened, and you are at increased risk for chlamydia or  gonorrhea. Ask your health care provider if you are at risk.  Ask your health care provider about whether you are at high risk for HIV. Your health care provider may recommend a prescription medicine to help prevent HIV infection. If you choose to take medicine to prevent HIV, you should first get tested for HIV. You should then be tested every 3 months for as long as you are taking the medicine. Pregnancy  If you are about to stop  having your period (premenopausal) and you may become pregnant, seek counseling before you get pregnant.  Take 400 to 800 micrograms (mcg) of folic acid every day if you become pregnant.  Ask for birth control (contraception) if you want to prevent pregnancy. Osteoporosis and menopause Osteoporosis is a disease in which the bones lose minerals and strength with aging. This can result in bone fractures. If you are 32 years old or older, or if you are at risk for osteoporosis and fractures, ask your health care provider if you should:  Be screened for bone loss.  Take a calcium or vitamin D supplement to lower your risk of fractures.  Be given hormone replacement therapy (HRT) to treat symptoms of menopause. Follow these instructions at home: Lifestyle  Do not use any products that contain nicotine or tobacco, such as cigarettes, e-cigarettes, and chewing tobacco. If you need help quitting, ask your health care provider.  Do not use street drugs.  Do not share needles.  Ask your health care provider for help if you need support or information about quitting drugs. Alcohol use  Do not drink alcohol if: ? Your health care provider tells you not to drink. ? You are pregnant, may be pregnant, or are planning to become pregnant.  If you drink alcohol: ? Limit how much you use to 0-1 drink a day. ? Limit intake if you are breastfeeding.  Be aware of how much alcohol is in your drink. In the [place] one drink equals one 12 oz bottle of beer (355 mL), one 5 oz glass of wine (148 mL), or one 1 oz glass of hard liquor (44 mL). General instructions  Schedule regular health, dental, and eye exams.  Stay current with your vaccines.  Tell your health care provider if: ? You often feel depressed. ? You have ever been abused or do not feel safe at home. Summary  Adopting a healthy lifestyle and getting preventive care are important in promoting health and wellness.  Follow your health  care provider's instructions about healthy diet, exercising, and getting tested or screened for diseases.  Follow your health care provider's instructions on monitoring your cholesterol and blood pressure. This information is not intended to replace advice given to you by your health care provider. Make sure you discuss any questions you have with your health care provider. Document Revised: 09/03/2018 Document Reviewed: 09/03/2018 Elsevier Patient Education  2020 [employer]   Plantar Fasciitis  Plantar fasciitis is a painful foot condition that affects the heel. It occurs when the band of tissue that connects the toes to the heel bone (plantar fascia) becomes irritated. This can happen as the result of exercising too much or doing other repetitive activities (overuse injury). The pain from plantar fasciitis can range from mild irritation to severe pain that makes it difficult to walk or move. The pain is usually worse in the morning after sleeping, or after sitting or lying down for a while. Pain may also be worse after long periods of walking  or standing. What are the causes? This condition may be caused by:  Standing for long periods of time.  Wearing shoes that do not have good arch support.  Doing activities that put stress on joints (high-impact activities), including running, aerobics, and ballet.  Being overweight.  An abnormal way of walking (gait).  Tight muscles in the back of your lower leg (calf).  High arches in your feet.  Starting a new athletic activity. What are the signs or symptoms? The main symptom of this condition is heel pain. Pain may:  Be worse with first steps after a time of rest, especially in the morning after sleeping or after you have been sitting or lying down for a while.  Be worse after long periods of standing still.  Decrease after 30-45 minutes of activity, such as gentle walking. How is this diagnosed? This condition may be diagnosed  based on your medical history and your symptoms. Your health care provider may ask questions about your activity level. Your health care provider will do a physical exam to check for:  A tender area on the bottom of your foot.  A high arch in your foot.  Pain when you move your foot.  Difficulty moving your foot. You may have imaging tests to confirm the diagnosis, such as:  X-rays.  Ultrasound.  MRI. How is this treated? Treatment for plantar fasciitis depends on how severe your condition is. Treatment may include:  Rest, ice, applying pressure (compression), and raising the affected foot (elevation). This may be called RICE therapy. Your health care provider may recommend RICE therapy along with over-the-counter pain medicines to manage your pain.  Exercises to stretch your calves and your plantar fascia.  A splint that holds your foot in a stretched, upward position while you sleep (night splint).  Physical therapy to relieve symptoms and prevent problems in the future.  Injections of steroid medicine (cortisone) to relieve pain and inflammation.  Stimulating your plantar fascia with electrical impulses (extracorporeal shock wave therapy). This is usually the last treatment option before surgery.  Surgery, if other treatments have not worked after 12 months. Follow these instructions at home:  Managing pain, stiffness, and swelling  If directed, put ice on the painful area: ? Put ice in a plastic bag, or use a frozen bottle of water. ? Place a towel between your skin and the bag or bottle. ? Roll the bottom of your foot over the bag or bottle. ? Do this for 20 minutes, 2-3 times a day.  Wear athletic shoes that have air-sole or gel-sole cushions, or try wearing soft shoe inserts that are designed for plantar fasciitis.  Raise (elevate) your foot above the level of your heart while you are sitting or lying down. Activity  Avoid activities that cause pain. Ask your  health care provider what activities are safe for you.  Do physical therapy exercises and stretches as told by your health care provider.  Try activities and forms of exercise that are easier on your joints (low-impact). Examples include swimming, water aerobics, and biking. General instructions  Take over-the-counter and prescription medicines only as told by your health care provider.  Wear a night splint while sleeping, if told by your health care provider. Loosen the splint if your toes tingle, become numb, or turn cold and blue.  Maintain a healthy weight, or work with your health care provider to lose weight as needed.  Keep all follow-up visits as told by your health care provider.  This is important. Contact a health care provider if you:  Have symptoms that do not go away after caring for yourself at home.  Have pain that gets worse.  Have pain that affects your ability to move or do your daily activities. Summary  Plantar fasciitis is a painful foot condition that affects the heel. It occurs when the band of tissue that connects the toes to the heel bone (plantar fascia) becomes irritated.  The main symptom of this condition is heel pain that may be worse after exercising too much or standing still for a long time.  Treatment varies, but it usually starts with rest, ice, compression, and elevation (RICE therapy) and over-the-counter medicines to manage pain. This information is not intended to replace advice given to you by your health care provider. Make sure you discuss any questions you have with your health care provider. Document Revised: 08/23/2017 Document Reviewed: 07/08/2017 Elsevier Patient Education  2020 [employer].

## 2019-10-05 NOTE — Progress Notes (Signed)
This visit occurred during the SARS-CoV-2 public health emergency.  Safety protocols were in place, including screening questions prior to the visit, additional usage of staff PPE, and extensive cleaning of exam room while observing appropriate contact time as indicated for disinfecting solutions.  Subjective:     Patient ID: Monica Ryan , female    DOB: Feb 21, 1956 , 64 y.o.   MRN: [id number]   Chief Complaint  Patient presents with  . Annual Exam  . Diabetes  . Hypertension    HPI  She is here today for a full physical examination.  She is no longer followed by GYN. She has no specific concerns or complaints at this time.   Diabetes She presents for her follow-up diabetic visit. She has type 2 diabetes mellitus. Her disease course has been stable. There are no hypoglycemic associated symptoms. Pertinent negatives for diabetes include no blurred vision and no chest pain. There are no hypoglycemic complications. Risk factors for coronary artery disease include diabetes mellitus, dyslipidemia, hypertension and post-menopausal. She is following a diabetic diet. She participates in exercise three times a week. Her breakfast blood glucose is taken between 7-8 am. Her breakfast blood glucose range is generally 90-110 mg/dl. An ACE inhibitor/angiotensin II receptor blocker is being taken. Eye exam is current.  Hypertension This is a chronic problem. The current episode started more than 1 year ago. The problem is controlled. Pertinent negatives include no blurred vision, chest pain, palpitations or shortness of breath. Past treatments include ACE inhibitors and diuretics. Hypertensive end-organ damage includes kidney disease.     Past Medical History:  Diagnosis Date  . Diabetes mellitus without complication ([place])   . Hyperlipemia   . Hypertension      Family History  Problem Relation Age of Onset  . Diabetes Mother   . Alzheimer's disease Father   . Diabetes Sister   . Diabetes  Brother      Current Outpatient Medications:  .  Accu-Chek FastClix Lancets MISC, Use as directed to check blood sugars 1 time per day dx: e11.22, Disp: 150 each, Rfl: 2 .  aspirin EC 81 MG tablet, Take 81 mg by mouth daily., Disp: , Rfl:  .  dapagliflozin propanediol (FARXIGA) 10 MG TABS tablet, TAKE 1 TABLET BY MOUTH EVERY DAY IN THE MORNING, Disp: 90 tablet, Rfl: 2 .  glucose blood (ACCU-CHEK SMARTVIEW) test strip, Use as instructed to check blood sugars 1 time per day dx:e 11.22, Disp: 150 each, Rfl: 2 .  lisinopril-hydrochlorothiazide (ZESTORETIC) 20-12.5 MG tablet, Take 1 tablet by mouth daily., Disp: 90 tablet, Rfl: 2 .  Semaglutide, 1 MG/DOSE, (OZEMPIC, 1 MG/DOSE,) 2 MG/1.5ML SOPN, Inject 1 mg into the skin once a week., Disp: 9 pen, Rfl: 2 .  simvastatin (ZOCOR) 40 MG tablet, Take 1 tablet (40 mg total) by mouth daily., Disp: 90 tablet, Rfl: 2   No Known Allergies    The patient states she uses post menopausal status for birth control. Last LMP was No LMP recorded. Patient is postmenopausal.. Negative for Dysmenorrhea Negative for: breast discharge, breast lump(s), breast pain and breast self exam. Associated symptoms include abnormal vaginal bleeding. Pertinent negatives include abnormal bleeding (hematology), anxiety, decreased libido, depression, difficulty falling sleep, dyspareunia, history of infertility, nocturia, sexual dysfunction, sleep disturbances, urinary incontinence, urinary urgency, vaginal discharge and vaginal itching. Diet regular.The patient states her exercise level is  intermittent. She has cut back due to heel pain.   . The patient's tobacco use is:  Social History  Tobacco Use  Smoking Status Never Smoker  Smokeless Tobacco Never Used  . She has been exposed to passive smoke. The patient's alcohol use is:  Social History   Substance and Sexual Activity  Alcohol Use Yes   Comment: occ    Review of Systems  Constitutional: Negative.   HENT: Negative.    Eyes: Negative.  Negative for blurred vision.  Respiratory: Negative.  Negative for shortness of breath.   Cardiovascular: Negative.  Negative for chest pain and palpitations.  Gastrointestinal: Negative.   Endocrine: Negative.   Genitourinary: Negative.   Musculoskeletal: Positive for arthralgias.       She c/o right heel pain. Sx started about a week ago. She denies fall/trauma. She is not sure what may have triggered the pain.   Skin: Negative.   Allergic/Immunologic: Negative.   Neurological: Negative.   Hematological: Negative.   Psychiatric/Behavioral: Negative.      Today's Vitals   10/05/19 0858  BP: 114/68  Pulse: 81  Temp: 98.2 F (36.8 C)  TempSrc: Oral  Weight: 191 lb 3.2 oz (86.7 kg)  Height: '5\' 1"'$  (1.549 m)   Body mass index is 36.13 kg/m.   Objective:  Physical Exam Vitals and nursing note reviewed.  Constitutional:      Appearance: Normal appearance. She is obese.  HENT:     Head: Normocephalic and atraumatic.     Right Ear: Tympanic membrane, ear canal and external ear normal.     Left Ear: Tympanic membrane, ear canal and external ear normal.     Nose: Nose normal.     Mouth/Throat:     Mouth: Mucous membranes are moist.     Pharynx: Oropharynx is clear.  Eyes:     Extraocular Movements: Extraocular movements intact.     Conjunctiva/sclera: Conjunctivae normal.     Pupils: Pupils are equal, round, and reactive to light.  Cardiovascular:     Rate and Rhythm: Normal rate and regular rhythm.     Pulses: Normal pulses.          Dorsalis pedis pulses are 2+ on the right side and 2+ on the left side.     Heart sounds: Normal heart sounds.  Pulmonary:     Effort: Pulmonary effort is normal.     Breath sounds: Normal breath sounds.  Chest:     Breasts: Tanner Score is 5.        Right: Normal.        Left: Normal.  Abdominal:     General: Bowel sounds are normal.     Palpations: Abdomen is soft. There is no mass.     Tenderness: There is no  abdominal tenderness. There is no guarding.     Hernia: No hernia is present.     Comments: Rounded, soft.   Genitourinary:    Comments: deferred Musculoskeletal:        General: Normal range of motion.     Cervical back: Normal range of motion and neck supple.  Feet:     Right foot:     Protective Sensation: 5 sites tested. 5 sites sensed.     Skin integrity: Skin integrity normal.     Toenail Condition: Right toenails are normal.     Left foot:     Protective Sensation: 5 sites tested. 5 sites sensed.     Skin integrity: Skin integrity normal.     Toenail Condition: Left toenails are normal.  Skin:    General: Skin is warm and  dry.  Neurological:     General: No focal deficit present.     Mental Status: She is alert and oriented to person, place, and time.  Psychiatric:        Mood and Affect: Mood normal.        Behavior: Behavior normal.         Assessment And Plan:     1. Routine general medical examination at health care facility  A full exam was performed.  Importance of monthly self breast exams was discussed with the patient. PATIENT HAS BEEN A DVISED TO GET 30-45 MINUTES REGULAR EXERCISE NO LESS THAN FOUR TO FIVE DAYS PER WEEK - BOTH WEIGHTBEARING EXERCISES AND AEROBIC ARE RECOMMENDED.  HE/SHE WAS ADVISED TO FOLLOW A HEALTHY DIET WITH AT LEAST SIX FRUITS/VEGGIES PER DAY, DECREASE INTAKE OF RED MEAT, AND TO INCREASE FISH INTAKE TO TWO DAYS PER WEEK.  MEATS/FISH SHOULD NOT BE FRIED, BAKED OR BROILED IS PREFERABLE.  I SUGGEST WEARING SPF 50 SUNSCREEN ON EXPOSED PARTS AND ESPECIALLY WHEN IN THE DIRECT SUNLIGHT FOR AN EXTENDED PERIOD OF TIME.  PLEASE AVOID FAST FOOD RESTAURANTS AND INCREASE YOUR WATER INTAKE.  - CMP14+EGFR - CBC - Lipid panel - Hemoglobin A1c  2. Type 2 diabetes mellitus with stage 2 chronic kidney disease, without long-term current use of insulin ([place])  Diabetic foot exam was performed.  I DISCUSSED WITH THE PATIENT AT LENGTH REGARDING THE GOALS OF  GLYCEMIC CONTROL AND POSSIBLE LONG-TERM COMPLICATIONS.  I  ALSO STRESSED THE IMPORTANCE OF COMPLIANCE WITH HOME GLUCOSE MONITORING, DIETARY RESTRICTIONS INCLUDING AVOIDANCE OF SUGARY DRINKS/PROCESSED FOODS,  ALONG WITH REGULAR EXERCISE.  I  ALSO STRESSED THE IMPORTANCE OF ANNUAL EYE EXAMS, SELF FOOT CARE AND COMPLIANCE WITH OFFICE VISITS.  - POCT Urinalysis Dipstick (81002) - POCT UA - Microalbumin  3. Hypertensive nephropathy  Chronic, well controlled. She will continue with current meds. EKG performed, no new changes noted. She is encouraged to avoid adding salt to her foods. She will rto in six months for re-evaluation.   - EKG 12-Lead  4. Class 2 severe obesity due to excess calories with serious comorbidity and body mass index (BMI) of 36.0 to 36.9 in adult [facility])  She is encouraged to strive for BMI less than 30 to decrease cardiac risk. She is encouraged to aim for at least 150 minutes of exercise per week.   5. Plantar fasciitis of right foot  Her sx are suggest of plantar fasciitis. She was given stretching exercises to perform upon awakening and while seated. She will let me know if her sx persist. If so, I will refer her to Podiatry at that time.    [name], MD    THE PATIENT IS ENCOURAGED TO PRACTICE SOCIAL DISTANCING DUE TO THE COVID-19 PANDEMIC.

## 2019-10-06 LAB — CMP14+EGFR
ALT: 16 IU/L (ref 0–32)
AST: 18 IU/L (ref 0–40)
Albumin/Globulin Ratio: 1.5 (ref 1.2–2.2)
Albumin: 4.6 g/dL (ref 3.8–4.8)
Alkaline Phosphatase: 136 IU/L — ABNORMAL HIGH (ref 39–117)
BUN/Creatinine Ratio: 16 (ref 12–28)
BUN: 16 mg/dL (ref 8–27)
Bilirubin Total: 0.4 mg/dL (ref 0.0–1.2)
CO2: 23 mmol/L (ref 20–29)
Calcium: 9.6 mg/dL (ref 8.7–10.3)
Chloride: 99 mmol/L (ref 96–106)
Creatinine, Ser: 0.99 mg/dL (ref 0.57–1.00)
GFR calc Af Amer: 70 mL/min/{1.73_m2} (ref >59)
GFR calc non Af Amer: 60 mL/min/{1.73_m2} (ref >59)
Globulin, Total: 3 g/dL (ref 1.5–4.5)
Glucose: 108 mg/dL — ABNORMAL HIGH (ref 65–99)
Potassium: 3.9 mmol/L (ref 3.5–5.2)
Sodium: 140 mmol/L (ref 134–144)
Total Protein: 7.6 g/dL (ref 6.0–8.5)

## 2019-10-06 LAB — CBC
Hematocrit: 42.4 % (ref 34.0–46.6)
Hemoglobin: 14.4 g/dL (ref 11.1–15.9)
MCH: 29.3 pg (ref 26.6–33.0)
MCHC: 34 g/dL (ref 31.5–35.7)
MCV: 86 fL (ref 79–97)
Platelets: 328 10*3/uL (ref 150–450)
RBC: 4.91 x10E6/uL (ref 3.77–5.28)
RDW: 13.6 % (ref 11.7–15.4)
WBC: 8.5 10*3/uL (ref 3.4–10.8)

## 2019-10-06 LAB — LIPID PANEL
Chol/HDL Ratio: 2.4 ratio (ref 0.0–4.4)
Cholesterol, Total: 183 mg/dL (ref 100–199)
HDL: 77 mg/dL (ref >39)
LDL Chol Calc (NIH): 84 mg/dL (ref 0–99)
Triglycerides: 130 mg/dL (ref 0–149)
VLDL Cholesterol Cal: 22 mg/dL (ref 5–40)

## 2019-10-06 LAB — HEMOGLOBIN A1C
Est. average glucose Bld gHb Est-mCnc: 146 mg/dL
Hgb A1c MFr Bld: 6.7 % — ABNORMAL HIGH (ref 4.8–5.6)

## 2019-10-07 ENCOUNTER — Encounter: Payer: Self-pay | Admitting: Internal Medicine

## 2019-10-09 ENCOUNTER — Encounter: Payer: Self-pay | Admitting: Internal Medicine

## 2019-10-12 ENCOUNTER — Other Ambulatory Visit: Payer: Self-pay

## 2019-10-12 MED ORDER — ACCU-CHEK FASTCLIX LANCETS MISC: 2 refills | Status: DC

## 2019-10-12 MED ORDER — ACCU-CHEK SMARTVIEW VI STRP: ORAL_STRIP | 2 refills | Status: DC

## 2019-10-13 ENCOUNTER — Other Ambulatory Visit: Payer: Self-pay | Admitting: Internal Medicine

## 2019-10-14 ENCOUNTER — Encounter: Payer: Self-pay | Admitting: Internal Medicine

## 2019-10-17 ENCOUNTER — Ambulatory Visit: Payer: BC Managed Care – PPO | Attending: Internal Medicine

## 2019-10-17 DIAGNOSIS — Z23 Encounter for immunization: Secondary | ICD-10-CM

## 2019-10-26 ENCOUNTER — Other Ambulatory Visit: Payer: Self-pay

## 2019-10-26 ENCOUNTER — Encounter: Payer: Self-pay | Admitting: Internal Medicine

## 2019-10-26 MED ORDER — ACCU-CHEK SMARTVIEW VI STRP: ORAL_STRIP | 2 refills | Status: DC

## 2019-10-26 MED ORDER — SIMVASTATIN 40 MG PO TABS: 40.0000 mg | ORAL_TABLET | Freq: Every day | ORAL | 2 refills | Status: DC

## 2019-10-26 MED ORDER — LISINOPRIL-HYDROCHLOROTHIAZIDE 20-12.5 MG PO TABS: 1.0000 | ORAL_TABLET | Freq: Every day | ORAL | 2 refills | Status: DC

## 2019-10-26 MED ORDER — FARXIGA 10 MG PO TABS: ORAL_TABLET | ORAL | 1 refills | Status: DC

## 2019-10-28 ENCOUNTER — Encounter: Payer: Self-pay | Admitting: Internal Medicine

## 2019-10-28 ENCOUNTER — Other Ambulatory Visit: Payer: Self-pay

## 2019-10-28 MED ORDER — TRUE METRIX LEVEL 3 HIGH VI SOLN: 2 refills | Status: DC

## 2019-10-29 ENCOUNTER — Telehealth: Payer: Self-pay

## 2019-10-29 NOTE — Telephone Encounter (Signed)
PA sent to plan for Farxiga

## 2019-11-02 ENCOUNTER — Other Ambulatory Visit: Payer: Self-pay

## 2019-11-02 MED ORDER — TRUEPLUS LANCETS 28G MISC: 2 refills | Status: DC

## 2019-11-02 MED ORDER — TRUE METRIX AIR GLUCOSE METER W/DEVICE KIT: 1.0000 | PACK | Freq: Every day | 1 refills | Status: DC

## 2019-11-02 MED ORDER — TRUE METRIX LEVEL 2 NORMAL VI SOLN: 2 refills | Status: DC

## 2019-11-02 MED ORDER — ALCOHOL PADS 70 % PADS: MEDICATED_PAD | 2 refills | Status: AC

## 2019-11-02 MED ORDER — TRUE METRIX BLOOD GLUCOSE TEST VI STRP: ORAL_STRIP | 2 refills | Status: DC

## 2019-11-02 MED ORDER — TRUE METRIX LEVEL 1 LOW VI SOLN: 2 refills | Status: DC

## 2019-11-02 MED ORDER — TRUE METRIX LEVEL 3 HIGH VI SOLN: 2 refills | Status: DC

## 2019-11-14 ENCOUNTER — Ambulatory Visit: Payer: BC Managed Care – PPO

## 2019-11-21 ENCOUNTER — Ambulatory Visit: Payer: BC Managed Care – PPO | Attending: Internal Medicine

## 2019-11-21 DIAGNOSIS — Z23 Encounter for immunization: Secondary | ICD-10-CM | POA: Insufficient documentation

## 2019-11-21 NOTE — Progress Notes (Signed)
   Covid-19 Vaccination Clinic  Name:  Monica Ryan    MRN: [id number] DOB: 1956/09/15  11/21/2019  [name] was observed post Covid-19 immunization for 15 minutes without incidence. She was provided with Vaccine Information Sheet and instruction to access the V-Safe system.   [name] was instructed to call 911 with any severe reactions post vaccine: Marland Kitchen Difficulty breathing  . Swelling of your face and throat  . A fast heartbeat  . A bad rash all over your body  . Dizziness and weakness    Immunizations Administered    Name Date Dose VIS Date Route   Moderna COVID-19 Vaccine 11/21/2019  8:48 AM 0.5 mL 08/25/2019 Intramuscular   Manufacturer: Moderna   Lot: [id number]   NDC: 01007-121-97

## 2019-11-25 ENCOUNTER — Encounter: Payer: Self-pay | Admitting: Internal Medicine

## 2019-12-24 DIAGNOSIS — Z0100 Encounter for examination of eyes and vision without abnormal findings: Secondary | ICD-10-CM | POA: Diagnosis not present

## 2020-01-05 ENCOUNTER — Ambulatory Visit (INDEPENDENT_AMBULATORY_CARE_PROVIDER_SITE_OTHER): Payer: Medicare HMO | Admitting: Internal Medicine

## 2020-01-05 ENCOUNTER — Encounter: Payer: Self-pay | Admitting: Internal Medicine

## 2020-01-05 ENCOUNTER — Other Ambulatory Visit: Payer: Self-pay

## 2020-01-05 VITALS — BP 140/80 | HR 77 | Temp 97.6°F | Ht 60.8 in | Wt 194.8 lb

## 2020-01-05 DIAGNOSIS — Z23 Encounter for immunization: Secondary | ICD-10-CM

## 2020-01-05 DIAGNOSIS — E1122 Type 2 diabetes mellitus with diabetic chronic kidney disease: Secondary | ICD-10-CM | POA: Diagnosis not present

## 2020-01-05 DIAGNOSIS — E2839 Other primary ovarian failure: Secondary | ICD-10-CM | POA: Diagnosis not present

## 2020-01-05 DIAGNOSIS — N182 Chronic kidney disease, stage 2 (mild): Secondary | ICD-10-CM

## 2020-01-05 DIAGNOSIS — I129 Hypertensive chronic kidney disease with stage 1 through stage 4 chronic kidney disease, or unspecified chronic kidney disease: Secondary | ICD-10-CM

## 2020-01-05 DIAGNOSIS — Z6837 Body mass index (BMI) 37.0-37.9, adult: Secondary | ICD-10-CM | POA: Diagnosis not present

## 2020-01-05 MED ORDER — PNEUMOCOCCAL 13-VAL CONJ VACC IM SUSP: 0.5000 mL | INTRAMUSCULAR | 0 refills | Status: AC

## 2020-01-05 NOTE — Patient Instructions (Signed)

## 2020-01-05 NOTE — Progress Notes (Signed)
This visit occurred during the SARS-CoV-2 public health emergency.  Safety protocols were in place, including screening questions prior to the visit, additional usage of staff PPE, and extensive cleaning of exam room while observing appropriate contact time as indicated for disinfecting solutions.  Subjective:     Patient ID: Monica Ryan , female    DOB: March 23, 1956 , 64 y.o.   MRN: [id number]   Chief Complaint  Patient presents with  . Diabetes  . Hypertension    HPI  She is here today for diabetes/BP check. She reports compliance with meds.  She admits that she is not yet exercising as she should.   Diabetes She presents for her follow-up diabetic visit. She has type 2 diabetes mellitus. Her disease course has been stable. There are no hypoglycemic associated symptoms. Pertinent negatives for diabetes include no blurred vision and no chest pain. There are no hypoglycemic complications. Risk factors for coronary artery disease include diabetes mellitus, dyslipidemia, hypertension and post-menopausal. She is following a diabetic diet. She participates in exercise three times a week. Her breakfast blood glucose is taken between 7-8 am. Her breakfast blood glucose range is generally 90-110 mg/dl. An ACE inhibitor/angiotensin II receptor blocker is being taken. Eye exam is current.  Hypertension This is a chronic problem. The current episode started more than 1 year ago. The problem is controlled. Pertinent negatives include no blurred vision, chest pain, palpitations or shortness of breath.     Past Medical History:  Diagnosis Date  . Diabetes mellitus without complication ([place])   . Hyperlipemia   . Hypertension      Family History  Problem Relation Age of Onset  . Diabetes Mother   . Alzheimer's disease Father   . Diabetes Sister   . Diabetes Brother      Current Outpatient Medications:  .  Alcohol Swabs (ALCOHOL PADS) 70 % PADS, Use as instructed to check blood sugars 1  time per day dx: e11.65, Disp: 150 each, Rfl: 2 .  aspirin EC 81 MG tablet, Take 81 mg by mouth daily., Disp: , Rfl:  .  Blood Glucose Calibration (TRUE METRIX LEVEL 1) Low SOLN, Use as directed dx: e11.65, Disp: 150 each, Rfl: 2 .  Blood Glucose Calibration (TRUE METRIX LEVEL 2) Normal SOLN, Use as directed dx: e11.65, Disp: 1 each, Rfl: 2 .  Blood Glucose Calibration (TRUE METRIX LEVEL 3) High SOLN, Use as directed dx: e11.65, Disp: 1 each, Rfl: 2 .  Blood Glucose Monitoring Suppl (TRUE METRIX AIR GLUCOSE METER) w/Device KIT, 1 kit by Does not apply route daily. Dx: e11.65, Disp: 1 kit, Rfl: 1 .  dapagliflozin propanediol (FARXIGA) 10 MG TABS tablet, Take one tablet by mouth daily., Disp: 90 tablet, Rfl: 1 .  glucose blood (TRUE METRIX BLOOD GLUCOSE TEST) test strip, Use as instructed to check blood sugars 1 time per day dx: e11.65, Disp: 150 each, Rfl: 2 .  lisinopril-hydrochlorothiazide (ZESTORETIC) 20-12.5 MG tablet, Take 1 tablet by mouth daily., Disp: 90 tablet, Rfl: 2 .  Semaglutide, 1 MG/DOSE, (OZEMPIC, 1 MG/DOSE,) 2 MG/1.5ML SOPN, Inject 1 mg into the skin once a week., Disp: 9 pen, Rfl: 2 .  simvastatin (ZOCOR) 40 MG tablet, Take 1 tablet (40 mg total) by mouth daily., Disp: 90 tablet, Rfl: 2 .  TRUEplus Lancets 28G MISC, Use as instructed to check blood sugars 1 time per day dx: e11.65, Disp: 150 each, Rfl: 2 .  pneumococcal 13-valent conjugate vaccine (PREVNAR 13) SUSP injection, Inject 0.5 mLs  into the muscle tomorrow at 10 am for 1 dose., Disp: 0.5 mL, Rfl: 0   No Known Allergies   Review of Systems  Constitutional: Negative.   Eyes: Negative for blurred vision.  Respiratory: Negative.  Negative for shortness of breath.   Cardiovascular: Negative.  Negative for chest pain and palpitations.  Gastrointestinal: Negative.   Neurological: Negative.   Psychiatric/Behavioral: Negative.      Today's Vitals   01/05/20 0945  BP: 140/80  Pulse: 77  Temp: 97.6 F (36.4 C)  TempSrc:  Oral  Weight: 194 lb 12.8 oz (88.4 kg)  Height: 5' 0.8" (1.544 m)   Body mass index is 37.05 kg/m.   Objective:  Physical Exam Vitals and nursing note reviewed.  Constitutional:      Appearance: Normal appearance.  HENT:     Head: Normocephalic and atraumatic.  Cardiovascular:     Rate and Rhythm: Normal rate and regular rhythm.     Heart sounds: Normal heart sounds.  Pulmonary:     Effort: Pulmonary effort is normal.     Breath sounds: Normal breath sounds.  Skin:    General: Skin is warm.  Neurological:     General: No focal deficit present.     Mental Status: She is alert.  Psychiatric:        Mood and Affect: Mood normal.        Behavior: Behavior normal.         Assessment And Plan:     1. Type 2 diabetes mellitus with stage 2 chronic kidney disease, without long-term current use of insulin ([facility])  Chronic.  I will check labs as listed below. Previous labs reviewed during her visit.   - BMP8+EGFR - Hemoglobin A1c  2. Hypertensive nephropathy  Chronic, fair control. She will continue with current meds for now. She is advised to avoid adding salt to her foods. Also advised to increase her exercise to 150 minutes per week.   3. Class 2 severe obesity due to excess calories with serious comorbidity and body mass index (BMI) of 37.0 to 37.9 in adult [facility])  She is encouraged to strive for BMI less than 32 to decrease cardiac risk. Importance of regular exercise was discussed with the patient.   4. Estrogen deficiency  I will refer her for bone density. She is advised to continue with magnesium and calcium supplementation.   - DG Bone Density; Future  5. Immunization due  She has received both COVID vaccines. I will send rx [id number] to her local pharmacy.   [name], MD    THE PATIENT IS ENCOURAGED TO PRACTICE SOCIAL DISTANCING DUE TO THE COVID-19 PANDEMIC.

## 2020-01-06 LAB — BMP8+EGFR
BUN/Creatinine Ratio: 17 (ref 12–28)
BUN: 17 mg/dL (ref 8–27)
CO2: 23 mmol/L (ref 20–29)
Calcium: 9.6 mg/dL (ref 8.7–10.3)
Chloride: 104 mmol/L (ref 96–106)
Creatinine, Ser: 1.03 mg/dL — ABNORMAL HIGH (ref 0.57–1.00)
GFR calc Af Amer: 66 mL/min/{1.73_m2} (ref >59)
GFR calc non Af Amer: 57 mL/min/{1.73_m2} — ABNORMAL LOW (ref >59)
Glucose: 92 mg/dL (ref 65–99)
Potassium: 3.8 mmol/L (ref 3.5–5.2)
Sodium: 143 mmol/L (ref 134–144)

## 2020-01-06 LAB — HEMOGLOBIN A1C
Est. average glucose Bld gHb Est-mCnc: 151 mg/dL
Hgb A1c MFr Bld: 6.9 % — ABNORMAL HIGH (ref 4.8–5.6)

## 2020-01-08 ENCOUNTER — Encounter: Payer: Self-pay | Admitting: Internal Medicine

## 2020-01-19 ENCOUNTER — Other Ambulatory Visit: Payer: Self-pay

## 2020-01-19 ENCOUNTER — Ambulatory Visit: Payer: Medicare HMO

## 2020-01-19 VITALS — BP 144/80 | HR 80 | Temp 98.3°F | Ht 60.8 in | Wt 192.6 lb

## 2020-01-19 DIAGNOSIS — I10 Essential (primary) hypertension: Secondary | ICD-10-CM

## 2020-01-19 MED ORDER — AMLODIPINE BESYLATE 5 MG PO TABS: 2.5000 mg | ORAL_TABLET | Freq: Every day | ORAL | 3 refills | Status: DC

## 2020-01-19 NOTE — Progress Notes (Signed)
pt came in today for a blood pressure check. blood pressure 144/80 today she is on lisinopril-hctz 20-12.5. her blood pressure was 140/80 at her last visit  provider would like to add amlodipine 5mg  at night and see her in 3 weeks

## 2020-01-21 ENCOUNTER — Other Ambulatory Visit: Payer: Self-pay | Admitting: Internal Medicine

## 2020-01-21 DIAGNOSIS — Z1231 Encounter for screening mammogram for malignant neoplasm of breast: Secondary | ICD-10-CM

## 2020-02-11 ENCOUNTER — Ambulatory Visit (INDEPENDENT_AMBULATORY_CARE_PROVIDER_SITE_OTHER): Payer: Medicare HMO | Admitting: Internal Medicine

## 2020-02-11 ENCOUNTER — Other Ambulatory Visit: Payer: Self-pay

## 2020-02-11 ENCOUNTER — Encounter: Payer: Self-pay | Admitting: Internal Medicine

## 2020-02-11 VITALS — BP 136/74 | HR 83 | Temp 97.6°F | Ht 60.8 in | Wt 190.8 lb

## 2020-02-11 DIAGNOSIS — I129 Hypertensive chronic kidney disease with stage 1 through stage 4 chronic kidney disease, or unspecified chronic kidney disease: Secondary | ICD-10-CM | POA: Diagnosis not present

## 2020-02-11 DIAGNOSIS — M79672 Pain in left foot: Secondary | ICD-10-CM | POA: Diagnosis not present

## 2020-02-11 DIAGNOSIS — N182 Chronic kidney disease, stage 2 (mild): Secondary | ICD-10-CM

## 2020-02-11 MED ORDER — AMLODIPINE BESYLATE 5 MG PO TABS
5.0000 mg | ORAL_TABLET | Freq: Every day | ORAL | 1 refills | Status: DC
Start: 2020-02-11 — End: 2020-05-09

## 2020-02-11 NOTE — Patient Instructions (Signed)
Plantar Fasciitis  Plantar fasciitis is a painful foot condition that affects the heel. It occurs when the band of tissue that connects the toes to the heel bone (plantar fascia) becomes irritated. This can happen as the result of exercising too much or doing other repetitive activities (overuse injury). The pain from plantar fasciitis can range from mild irritation to severe pain that makes it difficult to walk or move. The pain is usually worse in the morning after sleeping, or after sitting or lying down for a while. Pain may also be worse after long periods of walking or standing. What are the causes? This condition may be caused by:  Standing for long periods of time.  Wearing shoes that do not have good arch support.  Doing activities that put stress on joints (high-impact activities), including running, aerobics, and ballet.  Being overweight.  An abnormal way of walking (gait).  Tight muscles in the back of your lower leg (calf).  High arches in your feet.  Starting a new athletic activity. What are the signs or symptoms? The main symptom of this condition is heel pain. Pain may:  Be worse with first steps after a time of rest, especially in the morning after sleeping or after you have been sitting or lying down for a while.  Be worse after long periods of standing still.  Decrease after 30-45 minutes of activity, such as gentle walking. How is this diagnosed? This condition may be diagnosed based on your medical history and your symptoms. Your health care provider may ask questions about your activity level. Your health care provider will do a physical exam to check for:  A tender area on the bottom of your foot.  A high arch in your foot.  Pain when you move your foot.  Difficulty moving your foot. You may have imaging tests to confirm the diagnosis, such as:  X-rays.  Ultrasound.  MRI. How is this treated? Treatment for plantar fasciitis depends on how  severe your condition is. Treatment may include:  Rest, ice, applying pressure (compression), and raising the affected foot (elevation). This may be called RICE therapy. Your health care provider may recommend RICE therapy along with over-the-counter pain medicines to manage your pain.  Exercises to stretch your calves and your plantar fascia.  A splint that holds your foot in a stretched, upward position while you sleep (night splint).  Physical therapy to relieve symptoms and prevent problems in the future.  Injections of steroid medicine (cortisone) to relieve pain and inflammation.  Stimulating your plantar fascia with electrical impulses (extracorporeal shock wave therapy). This is usually the last treatment option before surgery.  Surgery, if other treatments have not worked after 12 months. Follow these instructions at home:  Managing pain, stiffness, and swelling  If directed, put ice on the painful area: ? Put ice in a plastic bag, or use a frozen bottle of water. ? Place a towel between your skin and the bag or bottle. ? Roll the bottom of your foot over the bag or bottle. ? Do this for 20 minutes, 2-3 times a day.  Wear athletic shoes that have air-sole or gel-sole cushions, or try wearing soft shoe inserts that are designed for plantar fasciitis.  Raise (elevate) your foot above the level of your heart while you are sitting or lying down. Activity  Avoid activities that cause pain. Ask your health care provider what activities are safe for you.  Do physical therapy exercises and stretches as told   by your health care provider.  Try activities and forms of exercise that are easier on your joints (low-impact). Examples include swimming, water aerobics, and biking. General instructions  Take over-the-counter and prescription medicines only as told by your health care provider.  Wear a night splint while sleeping, if told by your health care provider. Loosen the splint  if your toes tingle, become numb, or turn cold and blue.  Maintain a healthy weight, or work with your health care provider to lose weight as needed.  Keep all follow-up visits as told by your health care provider. This is important. Contact a health care provider if you:  Have symptoms that do not go away after caring for yourself at home.  Have pain that gets worse.  Have pain that affects your ability to move or do your daily activities. Summary  Plantar fasciitis is a painful foot condition that affects the heel. It occurs when the band of tissue that connects the toes to the heel bone (plantar fascia) becomes irritated.  The main symptom of this condition is heel pain that may be worse after exercising too much or standing still for a long time.  Treatment varies, but it usually starts with rest, ice, compression, and elevation (RICE therapy) and over-the-counter medicines to manage pain. This information is not intended to replace advice given to you by your health care provider. Make sure you discuss any questions you have with your health care provider. Document Revised: 08/23/2017 Document Reviewed: 07/08/2017 [employer] Patient Education  2020 [employer]  

## 2020-02-14 NOTE — Progress Notes (Signed)
This visit occurred during the SARS-CoV-2 public health emergency.  Safety protocols were in place, including screening questions prior to the visit, additional usage of staff PPE, and extensive cleaning of exam room while observing appropriate contact time as indicated for disinfecting solutions.  Subjective:     Patient ID: Monica Ryan , female    DOB: [birth year] , 64 y.o.   MRN: [id number]   Chief Complaint  Patient presents with  . Hypertension    HPI  She presents today for bp check. Amlodipine '5mg'$  daily was started after her last visit. She has not had any issues with the medication.     Past Medical History:  Diagnosis Date  . Diabetes mellitus without complication ([place])   . Hyperlipemia   . Hypertension      Family History  Problem Relation Age of Onset  . Diabetes Mother   . Alzheimer's disease Father   . Diabetes Sister   . Diabetes Brother      Current Outpatient Medications:  .  Alcohol Swabs (ALCOHOL PADS) 70 % PADS, Use as instructed to check blood sugars 1 time per day dx: e11.65, Disp: 150 each, Rfl: 2 .  amLODipine (NORVASC) 5 MG tablet, Take 1 tablet (5 mg total) by mouth daily., Disp: 90 tablet, Rfl: 1 .  aspirin EC 81 MG tablet, Take 81 mg by mouth daily., Disp: , Rfl:  .  Blood Glucose Calibration (TRUE METRIX LEVEL 1) Low SOLN, Use as directed dx: e11.65, Disp: 150 each, Rfl: 2 .  Blood Glucose Calibration (TRUE METRIX LEVEL 2) Normal SOLN, Use as directed dx: e11.65, Disp: 1 each, Rfl: 2 .  Blood Glucose Calibration (TRUE METRIX LEVEL 3) High SOLN, Use as directed dx: e11.65, Disp: 1 each, Rfl: 2 .  Blood Glucose Monitoring Suppl (TRUE METRIX AIR GLUCOSE METER) w/Device KIT, 1 kit by Does not apply route daily. Dx: e11.65, Disp: 1 kit, Rfl: 1 .  dapagliflozin propanediol (FARXIGA) 10 MG TABS tablet, Take one tablet by mouth daily., Disp: 90 tablet, Rfl: 1 .  glucose blood (TRUE METRIX BLOOD GLUCOSE TEST) test strip, Use as instructed to check blood  sugars 1 time per day dx: e11.65, Disp: 150 each, Rfl: 2 .  lisinopril-hydrochlorothiazide (ZESTORETIC) 20-12.5 MG tablet, Take 1 tablet by mouth daily., Disp: 90 tablet, Rfl: 2 .  Semaglutide, 1 MG/DOSE, (OZEMPIC, 1 MG/DOSE,) 2 MG/1.5ML SOPN, Inject 1 mg into the skin once a week., Disp: 9 pen, Rfl: 2 .  simvastatin (ZOCOR) 40 MG tablet, Take 1 tablet (40 mg total) by mouth daily., Disp: 90 tablet, Rfl: 2 .  TRUEplus Lancets 28G MISC, Use as instructed to check blood sugars 1 time per day dx: e11.65, Disp: 150 each, Rfl: 2   No Known Allergies   Review of Systems  Constitutional: Negative.   Respiratory: Negative.   Cardiovascular: Negative.   Gastrointestinal: Negative.   Musculoskeletal:       She c/o left foot pain. Hurts when she takes her first step after getting out of bed in am, and after being seated for a long period of time.   Neurological: Negative.   Psychiatric/Behavioral: Negative.      Today's Vitals   02/11/20 1538  BP: 136/74  Pulse: 83  Temp: 97.6 F (36.4 C)  TempSrc: Oral  Weight: 190 lb 12.8 oz (86.5 kg)  Height: 5' 0.8" (1.544 m)   Body mass index is 36.29 kg/m.   Objective:  Physical Exam Vitals and nursing note reviewed.  Constitutional:      Appearance: Normal appearance. She is obese.  HENT:     Head: Normocephalic and atraumatic.  Cardiovascular:     Rate and Rhythm: Normal rate and regular rhythm.     Pulses:          Dorsalis pedis pulses are 2+ on the left side.     Heart sounds: Normal heart sounds.  Pulmonary:     Effort: Pulmonary effort is normal.     Breath sounds: Normal breath sounds.  Feet:     Left foot:     Skin integrity: Skin integrity normal.  Skin:    General: Skin is warm.  Neurological:     General: No focal deficit present.     Mental Status: She is alert.  Psychiatric:        Mood and Affect: Mood normal.        Behavior: Behavior normal.         Assessment And Plan:     1. Hypertensive  nephropathy  Chronic, improved - not yet at goal. Pt is aware that goal BP is less than 130/80. She is encouraged to aim for at least 150 minutes of moderate exercise weekly.  She will be given 90 day refill of amlodipine '5mg'$  daily.   2. Chronic renal disease, stage II  Chronic, stable.   3. Left foot pain  Sx are suggestive of plantar fasciitis. She was given stretching exercises to perform. She may benefit from orthotics. If her sx persist, she would likely benefit from Podiatry evaluation.   [name], MD    THE PATIENT IS ENCOURAGED TO PRACTICE SOCIAL DISTANCING DUE TO THE COVID-19 PANDEMIC.

## 2020-02-26 ENCOUNTER — Other Ambulatory Visit: Payer: Self-pay | Admitting: Internal Medicine

## 2020-03-01 ENCOUNTER — Encounter: Payer: Self-pay | Admitting: Internal Medicine

## 2020-03-08 ENCOUNTER — Encounter: Payer: Self-pay | Admitting: Internal Medicine

## 2020-04-06 ENCOUNTER — Encounter: Payer: Self-pay | Admitting: Internal Medicine

## 2020-04-07 ENCOUNTER — Other Ambulatory Visit: Payer: Self-pay

## 2020-04-07 DIAGNOSIS — M79673 Pain in unspecified foot: Secondary | ICD-10-CM

## 2020-04-14 ENCOUNTER — Other Ambulatory Visit: Payer: Self-pay | Admitting: Internal Medicine

## 2020-04-15 ENCOUNTER — Other Ambulatory Visit: Payer: Self-pay | Admitting: Internal Medicine

## 2020-04-15 DIAGNOSIS — E2839 Other primary ovarian failure: Secondary | ICD-10-CM

## 2020-04-18 ENCOUNTER — Ambulatory Visit: Payer: BC Managed Care – PPO

## 2020-04-18 ENCOUNTER — Other Ambulatory Visit: Payer: BC Managed Care – PPO

## 2020-04-19 ENCOUNTER — Ambulatory Visit (INDEPENDENT_AMBULATORY_CARE_PROVIDER_SITE_OTHER): Payer: Medicare HMO | Admitting: Sports Medicine

## 2020-04-19 ENCOUNTER — Other Ambulatory Visit: Payer: Self-pay | Admitting: Sports Medicine

## 2020-04-19 ENCOUNTER — Other Ambulatory Visit: Payer: Self-pay

## 2020-04-19 ENCOUNTER — Ambulatory Visit (INDEPENDENT_AMBULATORY_CARE_PROVIDER_SITE_OTHER): Payer: Medicare HMO

## 2020-04-19 ENCOUNTER — Encounter: Payer: Self-pay | Admitting: Sports Medicine

## 2020-04-19 DIAGNOSIS — M722 Plantar fascial fibromatosis: Secondary | ICD-10-CM | POA: Diagnosis not present

## 2020-04-19 DIAGNOSIS — M79671 Pain in right foot: Secondary | ICD-10-CM

## 2020-04-19 DIAGNOSIS — E1122 Type 2 diabetes mellitus with diabetic chronic kidney disease: Secondary | ICD-10-CM

## 2020-04-19 DIAGNOSIS — M79672 Pain in left foot: Secondary | ICD-10-CM

## 2020-04-19 DIAGNOSIS — N182 Chronic kidney disease, stage 2 (mild): Secondary | ICD-10-CM | POA: Diagnosis not present

## 2020-04-19 NOTE — Progress Notes (Signed)
Subjective: Monica Ryan is a 64 y.o. female patient presents to office with complaint of pain to the bottom of her feet and arches reports that the pain has been going on for 2 to 3 years and reports that the pain is gradual more painful when she does walking for exercise and with first few steps out of bed in the morning reports that there is achy sensation pain 6 out of 10. Patient reports that she is diabetic and states that she also wants to have her feet checked. Patient reports that her blood sugars are under well control with last A1c around 6.9. Patient denies numbness tingling burning or any other pedal complaints at this time.  Review of symptoms noncontributory  Patient Active Problem List   Diagnosis Date Noted  . Type 2 diabetes mellitus with stage 2 chronic kidney disease, without long-term current use of insulin ([place]) 09/30/2018  . Chronic renal disease, stage II 09/30/2018  . Hypertensive nephropathy 09/30/2018  . Left foot pain 09/30/2018    Current Outpatient Medications on File Prior to Visit  Medication Sig Dispense Refill  . Alcohol Swabs (ALCOHOL PADS) 70 % PADS Use as instructed to check blood sugars 1 time per day dx: e11.65 150 each 2  . amLODipine (NORVASC) 5 MG tablet Take 1 tablet (5 mg total) by mouth daily. 90 tablet 1  . aspirin EC 81 MG tablet Take 81 mg by mouth daily.    . Blood Glucose Calibration (TRUE METRIX LEVEL 1) Low SOLN Use as directed dx: e11.65 150 each 2  . Blood Glucose Calibration (TRUE METRIX LEVEL 2) Normal SOLN Use as directed dx: e11.65 1 each 2  . Blood Glucose Calibration (TRUE METRIX LEVEL 3) High SOLN Use as directed dx: e11.65 1 each 2  . Blood Glucose Monitoring Suppl (TRUE METRIX AIR GLUCOSE METER) w/Device KIT 1 kit by Does not apply route daily. Dx: e11.65 1 kit 1  . dapagliflozin propanediol (FARXIGA) 10 MG TABS tablet Take one tablet by mouth daily. 90 tablet 1  . glucose blood (TRUE METRIX BLOOD GLUCOSE TEST) test strip Use as  instructed to check blood sugars 1 time per day dx: e11.65 150 each 2  . lisinopril-hydrochlorothiazide (ZESTORETIC) 20-12.5 MG tablet Take 1 tablet by mouth daily. 90 tablet 2  . OZEMPIC, 1 MG/DOSE, 2 MG/1.5ML SOPN INJECT 1 MG INTO THE SKIN ONCE A WEEK. 4.5 mL 3  . simvastatin (ZOCOR) 40 MG tablet TAKE 1 TABLET BY MOUTH EVERY DAY 90 tablet 1  . TRUEplus Lancets 28G MISC Use as instructed to check blood sugars 1 time per day dx: e11.65 150 each 2   No current facility-administered medications on file prior to visit.    No Known Allergies  Objective: Physical Exam General: The patient is alert and oriented x3 in no acute distress.  Dermatology: Skin is warm, dry and supple bilateral lower extremities. Nails 1-10 are normal and well manicured with glitter polish on. There is no erythema, edema, no eccymosis, no open lesions present. Integument is otherwise unremarkable.  Vascular: Dorsalis Pedis pulse and Posterior Tibial pulse are 2/4 bilateral. Capillary fill time is immediate to all digits.  Neurological: Grossly intact to light touch. Vibratory sensation intact bilateral.  Musculoskeletal: Mild reproducible tenderness to palpation at the medial calcaneal tubercale and through the insertion of the plantar fascia on the left and right foot. Pes planus bilateral. Asymptomatic bunion bilateral. Decreased ankle joint range of motion bilateral. Strength 5/5 in all groups bilateral.  Gait: Unassisted  Xray, Right/Left foot:  Normal osseous mineralization. Joint spaces preserved except at midtarsal joint with breech supportive of pes planus. No fracture/dislocation/boney destruction. Calcaneal spur present with mild thickening of plantar fascia. No other soft tissue abnormalities or radiopaque foreign bodies.   Assessment and Plan: Problem List Items Addressed This Visit      Endocrine   Type 2 diabetes mellitus with stage 2 chronic kidney disease, without long-term current use of insulin  ([place])    Other Visit Diagnoses    Foot pain, bilateral    -  Primary   Plantar fasciitis          -Complete examination performed.  -Xrays reviewed -Discussed with patient in detail the condition of plantar fasciitis secondary to foot type pes planus -No medication given at this time patient reports that her symptoms are not that bad to me medication - Explained in detail the use of the fascial braces bilateral which was dispensed at today's visit. -Shoe list provided and advised patient braces work well may benefit from getting new shoes and even considering over-the-counter insoles from [employer] or [employer] or diabetic shoes from our office -Explained and dispensed to patient daily stretching exercises. -Recommend patient to ice affected area 1-2x daily. -Patient to return to office if fails to continue to improve or sooner if problems or questions arise.  Landis Martins, DPM

## 2020-04-19 NOTE — Patient Instructions (Signed)

## 2020-04-22 ENCOUNTER — Ambulatory Visit
Admission: RE | Admit: 2020-04-22 | Discharge: 2020-04-22 | Disposition: A | Discharge status: Home / Self Care | Payer: Medicare HMO | Source: Ambulatory Visit | Attending: Internal Medicine | Admitting: Internal Medicine

## 2020-04-22 ENCOUNTER — Other Ambulatory Visit: Payer: Self-pay

## 2020-04-22 DIAGNOSIS — Z1231 Encounter for screening mammogram for malignant neoplasm of breast: Secondary | ICD-10-CM

## 2020-05-09 ENCOUNTER — Encounter: Payer: Self-pay | Admitting: Internal Medicine

## 2020-05-09 ENCOUNTER — Ambulatory Visit (INDEPENDENT_AMBULATORY_CARE_PROVIDER_SITE_OTHER): Payer: Medicare HMO | Admitting: Internal Medicine

## 2020-05-09 ENCOUNTER — Other Ambulatory Visit: Payer: Self-pay

## 2020-05-09 VITALS — BP 140/74 | HR 81 | Temp 98.2°F | Ht 61.0 in | Wt 190.8 lb

## 2020-05-09 DIAGNOSIS — N182 Chronic kidney disease, stage 2 (mild): Secondary | ICD-10-CM

## 2020-05-09 DIAGNOSIS — Z6836 Body mass index (BMI) 36.0-36.9, adult: Secondary | ICD-10-CM

## 2020-05-09 DIAGNOSIS — E1122 Type 2 diabetes mellitus with diabetic chronic kidney disease: Secondary | ICD-10-CM | POA: Diagnosis not present

## 2020-05-09 DIAGNOSIS — I129 Hypertensive chronic kidney disease with stage 1 through stage 4 chronic kidney disease, or unspecified chronic kidney disease: Secondary | ICD-10-CM | POA: Diagnosis not present

## 2020-05-09 MED ORDER — AMLODIPINE BESYLATE 5 MG PO TABS: 5.0000 mg | ORAL_TABLET | Freq: Every day | ORAL | 1 refills | Status: DC

## 2020-05-09 NOTE — Patient Instructions (Addendum)
Diabetes Mellitus and Exercise Exercising regularly is important for your overall health, especially when you have diabetes (diabetes mellitus). Exercising is not only about losing weight. It has many other health benefits, such as increasing muscle strength and bone density and reducing body fat and stress. This leads to improved fitness, flexibility, and endurance, all of which result in better overall health. Exercise has additional benefits for people with diabetes, including:  Reducing appetite.  Helping to lower and control blood glucose.  Lowering blood pressure.  Helping to control amounts of fatty substances (lipids) in the blood, such as cholesterol and triglycerides.  Helping the body to respond better to insulin (improving insulin sensitivity).  Reducing how much insulin the body needs.  Decreasing the risk for heart disease by: ? Lowering cholesterol and triglyceride levels. ? Increasing the levels of good cholesterol. ? Lowering blood glucose levels. What is my activity plan? Your health care provider or certified diabetes educator can help you make a plan for the type and frequency of exercise (activity plan) that works for you. Make sure that you:  Do at least 150 minutes of moderate-intensity or vigorous-intensity exercise each week. This could be brisk walking, biking, or water aerobics. ? Do stretching and strength exercises, such as yoga or weightlifting, at least 2 times a week. ? Spread out your activity over at least 3 days of the week.  Get some form of physical activity every day. ? Do not go more than 2 days in a row without some kind of physical activity. ? Avoid being inactive for more than 30 minutes at a time. Take frequent breaks to walk or stretch.  Choose a type of exercise or activity that you enjoy, and set realistic goals.  Start slowly, and gradually increase the intensity of your exercise over time. What do I need to know about managing my  diabetes?   Check your blood glucose before and after exercising. ? If your blood glucose is 240 mg/dL (13.3 mmol/L) or higher before you exercise, check your urine for ketones. If you have ketones in your urine, do not exercise until your blood glucose returns to normal. ? If your blood glucose is 100 mg/dL (5.6 mmol/L) or lower, eat a snack containing 15-20 grams of carbohydrate. Check your blood glucose 15 minutes after the snack to make sure that your level is above 100 mg/dL (5.6 mmol/L) before you start your exercise.  Know the symptoms of low blood glucose (hypoglycemia) and how to treat it. Your risk for hypoglycemia increases during and after exercise. Common symptoms of hypoglycemia can include: ? Hunger. ? Anxiety. ? Sweating and feeling clammy. ? Confusion. ? Dizziness or feeling light-headed. ? Increased heart rate or palpitations. ? Blurry vision. ? Tingling or numbness around the mouth, lips, or tongue. ? Tremors or shakes. ? Irritability.  Keep a rapid-acting carbohydrate snack available before, during, and after exercise to help prevent or treat hypoglycemia.  Avoid injecting insulin into areas of the body that are going to be exercised. For example, avoid injecting insulin into: ? The arms, when playing tennis. ? The legs, when jogging.  Keep records of your exercise habits. Doing this can help you and your health care provider adjust your diabetes management plan as needed. Write down: ? Food that you eat before and after you exercise. ? Blood glucose levels before and after you exercise. ? The type and amount of exercise you have done. ? When your insulin is expected to peak, if you use   insulin. Avoid exercising at times when your insulin is peaking.  When you start a new exercise or activity, work with your health care provider to make sure the activity is safe for you, and to adjust your insulin, medicines, or food intake as needed.  Drink plenty of water while  you exercise to prevent dehydration or heat stroke. Drink enough fluid to keep your urine clear or pale yellow. Summary  Exercising regularly is important for your overall health, especially when you have diabetes (diabetes mellitus).  Exercising has many health benefits, such as increasing muscle strength and bone density and reducing body fat and stress.  Your health care provider or certified diabetes educator can help you make a plan for the type and frequency of exercise (activity plan) that works for you.  When you start a new exercise or activity, work with your health care provider to make sure the activity is safe for you, and to adjust your insulin, medicines, or food intake as needed. This information is not intended to replace advice given to you by your health care provider. Make sure you discuss any questions you have with your health care provider. Document Revised: 04/04/2017 Document Reviewed: 02/20/2016 Elsevier Patient Education  2020 Elsevier Inc.   Cooking With Less [employer] with less salt is one way to reduce the amount of sodium you get from food. Depending on your condition and overall health, your health care provider or diet and nutrition specialist (dietitian) may recommend that you reduce your sodium intake. Most people should have less than 2,300 milligrams (mg) of sodium each day. If you have high blood pressure (hypertension), you may need to limit your sodium to 1,500 mg each day. Follow the tips below to help reduce your sodium intake. What do I need to know about cooking with less salt? Shopping  Buy sodium-free or low-sodium products. Look for the following words on food labels: ? Low-sodium. ? Sodium-free. ? Reduced-sodium. ? No salt added. ? Unsalted.  Buy fresh or frozen vegetables. Avoid canned vegetables.  Avoid buying meats or protein foods that have been injected with broth or saline solution.  Avoid cured or smoked meats, such as hot  dogs, bacon, salami, ham, and bologna. Reading food labels   Check the food label before buying or using packaged ingredients.  Look for products with no more than 140 mg of sodium in one serving.  Do not choose foods with salt as one of the first three ingredients on the ingredients list. If salt is one of the first three ingredients, it usually means the item is high in sodium, because ingredients are listed in order of amount in the food item. Cooking  Use herbs, seasonings without salt, and spices as substitutes for salt in foods.  Use sodium-free baking soda when baking.  Grill, braise, or roast foods to add flavor with less salt.  Avoid adding salt to pasta, rice, or hot cereals while cooking.  Drain and rinse canned vegetables before use.  Avoid adding salt when cooking sweets and desserts.  Cook with low-sodium ingredients. What are some salt alternatives? The following are herbs, seasonings, and spices that can be used instead of salt to give taste to your food. Herbs should be fresh or dried. Do not choose packaged mixes. Next to the name of the herb, spice, or seasoning are some examples of foods you can pair it with. Herbs  Bay leaves - Soups, meat and vegetable dishes, and spaghetti sauce.  Domingo Cocking -  Italian dishes, soups, pasta, and fish dishes.  Cilantro - Meat, poultry, and vegetable dishes.  Chili powder - Marinades and Mexican dishes.  Chives - Salad dressings and potato dishes.  Cumin - Mexican dishes, couscous, and meat dishes.  Dill - Fish dishes, sauces, and salads.  Fennel - Meat and vegetable dishes, breads, and cookies.  Garlic (do not use garlic salt) - [place] dishes, meat dishes, salad dressings, and sauces.  Marjoram - Soups, potato dishes, and meat dishes.  Oregano - Pizza and spaghetti sauce.  Parsley - Salads, soups, pasta, and meat dishes.  Rosemary - [place] dishes, salad dressings, soups, and red meats.  Saffron - Fish dishes,  pasta, and some poultry dishes.  Sage - Stuffings and sauces.  Tarragon - Fish and [employer].  Thyme - Stuffing, meat, and fish dishes. Seasonings  Lemon juice - Fish dishes, poultry dishes, vegetables, and salads.  Vinegar - Salad dressings, vegetables, and fish dishes. Spices  Cinnamon - Sweet dishes, such as cakes, cookies, and puddings.  Cloves - Gingerbread, puddings, and marinades for meats.  Curry - Vegetable dishes, fish and poultry dishes, and stir-fry dishes.  Ginger - Vegetables dishes, fish dishes, and stir-fry dishes.  Nutmeg - Pasta, vegetables, poultry, fish dishes, and custard. What are some low-sodium ingredients and foods?  Fresh or frozen fruits and vegetables with no sauce added.  Fresh or frozen whole meats, poultry, and fish with no sauce added.  Eggs.  Noodles, pasta, quinoa, rice.  Shredded or puffed wheat or puffed rice.  Regular or quick oats.  Milk, yogurt, hard cheeses, and low-sodium cheeses. Good cheese choices include Swiss, [employer], and [address]. Always check the label for the serving size and sodium content.  Unsalted butter or margarine.  Unsalted nuts.  Sherbet or ice cream (keep to  cup per serving).  Homemade pudding.  Sodium-free baking soda and baking powder. This is not a complete list of low-sodium ingredients and foods. Contact your dietitian for more options. Summary  Cooking with less salt is one way to reduce the amount of sodium that you get from food.  Buy sodium-free or low-sodium products.  Check the food label before using or buying packaged ingredients.  Use herbs, seasonings without salt, and spices as substitutes for salt in foods. This information is not intended to replace advice given to you by your health care provider. Make sure you discuss any questions you have with your health care provider. Document Revised: 08/23/2017 Document Reviewed: 09/18/2016 Elsevier Patient Education  2020  [employer].

## 2020-05-09 NOTE — Progress Notes (Signed)
I,Tianna Badgett,acting as a Education administrator for [name], MD.,have documented all relevant documentation on the behalf of [name], MD,as directed by  [name], MD while in the presence of [name], MD.  This visit occurred during the SARS-CoV-2 public health emergency.  Safety protocols were in place, including screening questions prior to the visit, additional usage of staff PPE, and extensive cleaning of exam room while observing appropriate contact time as indicated for disinfecting solutions.  Subjective:     Patient ID: Monica Ryan , female    DOB: [birth year] , 64 y.o.   MRN: [id number]   Chief Complaint  Patient presents with  . Diabetes  . Hypertension    HPI  She is here today for diabetes/BP check. She reports compliance with meds.  She states that she is now exercising five days per week. She walks 45 minutes daily. She also admits that she is still cooking with salt.   Diabetes She presents for her follow-up diabetic visit. She has type 2 diabetes mellitus. Her disease course has been stable. There are no hypoglycemic associated symptoms. Pertinent negatives for diabetes include no blurred vision. There are no hypoglycemic complications. Risk factors for coronary artery disease include diabetes mellitus, dyslipidemia, hypertension and post-menopausal. She is following a diabetic diet. She participates in exercise three times a week. Her breakfast blood glucose is taken between 7-8 am. Her breakfast blood glucose range is generally 90-110 mg/dl. An ACE inhibitor/angiotensin II receptor blocker is being taken. Eye exam is current.  Hypertension This is a chronic problem. The current episode started more than 1 year ago. The problem is controlled. Pertinent negatives include no blurred vision.     Past Medical History:  Diagnosis Date  . Diabetes mellitus without complication ([place])   . Hyperlipemia   . Hypertension      Family History  Problem  Relation Age of Onset  . Diabetes Mother   . Alzheimer's disease Father   . Diabetes Sister   . Diabetes Brother      Current Outpatient Medications:  .  Alcohol Swabs (ALCOHOL PADS) 70 % PADS, Use as instructed to check blood sugars 1 time per day dx: e11.65, Disp: 150 each, Rfl: 2 .  amLODipine (NORVASC) 5 MG tablet, Take 1 tablet (5 mg total) by mouth daily., Disp: 90 tablet, Rfl: 1 .  aspirin EC 81 MG tablet, Take 81 mg by mouth daily., Disp: , Rfl:  .  Blood Glucose Calibration (TRUE METRIX LEVEL 1) Low SOLN, Use as directed dx: e11.65, Disp: 150 each, Rfl: 2 .  Blood Glucose Calibration (TRUE METRIX LEVEL 2) Normal SOLN, Use as directed dx: e11.65, Disp: 1 each, Rfl: 2 .  Blood Glucose Calibration (TRUE METRIX LEVEL 3) High SOLN, Use as directed dx: e11.65, Disp: 1 each, Rfl: 2 .  Blood Glucose Monitoring Suppl (TRUE METRIX AIR GLUCOSE METER) w/Device KIT, 1 kit by Does not apply route daily. Dx: e11.65, Disp: 1 kit, Rfl: 1 .  dapagliflozin propanediol (FARXIGA) 10 MG TABS tablet, Take one tablet by mouth daily., Disp: 90 tablet, Rfl: 1 .  glucose blood (TRUE METRIX BLOOD GLUCOSE TEST) test strip, Use as instructed to check blood sugars 1 time per day dx: e11.65, Disp: 150 each, Rfl: 2 .  lisinopril-hydrochlorothiazide (ZESTORETIC) 20-12.5 MG tablet, Take 1 tablet by mouth daily., Disp: 90 tablet, Rfl: 2 .  OZEMPIC, 1 MG/DOSE, 2 MG/1.5ML SOPN, INJECT 1 MG INTO THE SKIN ONCE A WEEK., Disp: 4.5  mL, Rfl: 3 .  simvastatin (ZOCOR) 40 MG tablet, TAKE 1 TABLET BY MOUTH EVERY DAY, Disp: 90 tablet, Rfl: 1 .  TRUEplus Lancets 28G MISC, Use as instructed to check blood sugars 1 time per day dx: e11.65, Disp: 150 each, Rfl: 2   No Known Allergies   Review of Systems  Constitutional: Negative.   Eyes: Negative for blurred vision.  Respiratory: Negative.   Cardiovascular: Negative.   Gastrointestinal: Negative.   Neurological: Negative.      Today's Vitals   05/09/20 0835  BP: 140/74   Pulse: 81  Temp: 98.2 F (36.8 C)  TempSrc: Oral  Weight: 190 lb 12.8 oz (86.5 kg)  Height: 5' 1" (1.549 m)   Body mass index is 36.05 kg/m.   Objective:  Physical Exam Vitals and nursing note reviewed.  Constitutional:      Appearance: Normal appearance. She is obese.  HENT:     Head: Normocephalic and atraumatic.  Cardiovascular:     Rate and Rhythm: Normal rate and regular rhythm.     Heart sounds: Normal heart sounds.  Pulmonary:     Effort: Pulmonary effort is normal.     Breath sounds: Normal breath sounds.  Skin:    General: Skin is warm.  Neurological:     General: No focal deficit present.     Mental Status: She is alert.  Psychiatric:        Mood and Affect: Mood normal.        Behavior: Behavior normal.         Assessment And Plan:     1. Type 2 diabetes mellitus with stage 2 chronic kidney disease, without long-term current use of insulin (HCC) Comments: Chronic. I will check labs as listed below.  - BMP8+EGFR - Hemoglobin A1c  2. Hypertensive nephropathy Comments: Chronic, uncontrolled. She will continue with current meds for now. Encouraged to cut back on her salt intake. She was also given information on cooking with less salt.   3. Class 2 severe obesity due to excess calories with serious comorbidity and body mass index (BMI) of 36.0 to 36.9 in adult [facility])  She is encouraged to strive for BMI less than 30 to decrease cardiac risk. Advised to aim for at least 150 minutes of exercise per week. Wt Readings from Last 3 Encounters:  05/09/20 190 lb 12.8 oz (86.5 kg)  02/11/20 190 lb 12.8 oz (86.5 kg)  01/19/20 192 lb 9.6 oz (87.4 kg)       Patient was given opportunity to ask questions. Patient verbalized understanding of the plan and was able to repeat key elements of the plan. All questions were answered to their satisfaction.  [name], MD   I, [name], MD, have reviewed all documentation for this visit. The documentation on  05/09/20 for the exam, diagnosis, procedures, and orders are all accurate and complete.  THE PATIENT IS ENCOURAGED TO PRACTICE SOCIAL DISTANCING DUE TO THE COVID-19 PANDEMIC.

## 2020-05-10 LAB — BMP8+EGFR
BUN/Creatinine Ratio: 23 (ref 12–28)
BUN: 23 mg/dL (ref 8–27)
CO2: 23 mmol/L (ref 20–29)
Calcium: 9.5 mg/dL (ref 8.7–10.3)
Chloride: 103 mmol/L (ref 96–106)
Creatinine, Ser: 0.98 mg/dL (ref 0.57–1.00)
GFR calc Af Amer: 70 mL/min/{1.73_m2} (ref >59)
GFR calc non Af Amer: 61 mL/min/{1.73_m2} (ref >59)
Glucose: 111 mg/dL — ABNORMAL HIGH (ref 65–99)
Potassium: 4.1 mmol/L (ref 3.5–5.2)
Sodium: 141 mmol/L (ref 134–144)

## 2020-05-10 LAB — HEMOGLOBIN A1C
Est. average glucose Bld gHb Est-mCnc: 151 mg/dL
Hgb A1c MFr Bld: 6.9 % — ABNORMAL HIGH (ref 4.8–5.6)

## 2020-05-31 ENCOUNTER — Other Ambulatory Visit: Payer: Self-pay

## 2020-05-31 ENCOUNTER — Other Ambulatory Visit: Payer: Self-pay | Admitting: Internal Medicine

## 2020-05-31 ENCOUNTER — Ambulatory Visit: Payer: Medicare HMO

## 2020-05-31 VITALS — BP 128/72 | HR 81 | Temp 98.4°F | Ht 61.0 in | Wt 191.0 lb

## 2020-05-31 DIAGNOSIS — I10 Essential (primary) hypertension: Secondary | ICD-10-CM

## 2020-05-31 NOTE — Progress Notes (Signed)
Pt presents today for b/p check she is currently taking amlodipine 5mg  and lisinopril hydrochlorothiazide 20-12.5 Pt b/p today was 128/72 with manual her electronic was 145/84 pt stated she will take machine to get calibrated

## 2020-06-28 ENCOUNTER — Ambulatory Visit
Admission: RE | Admit: 2020-06-28 | Discharge: 2020-06-28 | Disposition: A | Discharge status: Home / Self Care | Payer: BC Managed Care – PPO | Source: Ambulatory Visit | Attending: Internal Medicine | Admitting: Internal Medicine

## 2020-06-28 ENCOUNTER — Other Ambulatory Visit: Payer: Self-pay

## 2020-06-28 DIAGNOSIS — Z780 Asymptomatic menopausal state: Secondary | ICD-10-CM | POA: Diagnosis not present

## 2020-06-28 DIAGNOSIS — E2839 Other primary ovarian failure: Secondary | ICD-10-CM

## 2020-06-28 DIAGNOSIS — M85852 Other specified disorders of bone density and structure, left thigh: Secondary | ICD-10-CM | POA: Diagnosis not present

## 2020-06-30 ENCOUNTER — Telehealth: Payer: Self-pay | Admitting: *Deleted

## 2020-06-30 NOTE — Chronic Care Management (AMB) (Signed)
Chronic Care Management   Note  06/30/2020 Name: [name] MRN: [id number] DOB: Aug 29, 1956  Monica Ryan is a 64 y.o. year old female who is a primary care patient of [name], MD. I reached out to [name] by phone today in response to a referral sent by [name]'s health plan.     [name] was given information about Chronic Care Management services today including:  1. CCM service includes personalized support from designated clinical staff supervised by her physician, including individualized plan of care and coordination with other care providers 2. 24/7 contact phone numbers for assistance for urgent and routine care needs. 3. Service will only be billed when office clinical staff spend 20 minutes or more in a month to coordinate care. 4. Only one practitioner may furnish and bill the service in a calendar month. 5. The patient may stop CCM services at any time (effective at the end of the month) by phone call to the office staff. 6. The patient will be responsible for cost sharing (co-pay) of up to 20% of the service fee (after annual deductible is met).  Patient agreed to services and verbal consent obtained.   Follow up plan: Telephone appointment with care management team member scheduled for:07/05/2020  [facility]  Direct Dial: [phone]

## 2020-07-01 ENCOUNTER — Telehealth: Payer: Self-pay | Admitting: Internal Medicine

## 2020-07-01 NOTE — Progress Notes (Signed)
  Chronic Care Management   Note  07/01/2020 Name: DENETTE HASS MRN: [id number] DOB: January 03, 1956  SARA KEYS is a 64 y.o. year old female who is a primary care patient of Dorothyann Peng, MD. I reached out to [name] by phone today in response to a referral sent by Ms. Salley Scarlet Rossa's PCP, Dorothyann Peng, MD.   Ms. Amoroso was given information about Chronic Care Management services today including:  1. CCM service includes personalized support from designated clinical staff supervised by her physician, including individualized plan of care and coordination with other care providers 2. 24/7 contact phone numbers for assistance for urgent and routine care needs. 3. Service will only be billed when office clinical staff spend 20 minutes or more in a month to coordinate care. 4. Only one practitioner may furnish and bill the service in a calendar month. 5. The patient may stop CCM services at any time (effective at the end of the month) by phone call to the office staff.   Patient agreed to services and verbal consent obtained.   Follow up plan:   Carley Perdue [employer] Scheduler

## 2020-07-05 ENCOUNTER — Telehealth: Payer: Medicare HMO

## 2020-07-08 ENCOUNTER — Other Ambulatory Visit: Payer: Self-pay | Admitting: Internal Medicine

## 2020-07-11 ENCOUNTER — Telehealth: Payer: Self-pay

## 2020-07-21 NOTE — Telephone Encounter (Signed)
DM eye exam telephone call

## 2020-07-26 ENCOUNTER — Ambulatory Visit: Payer: BC Managed Care – PPO | Attending: Internal Medicine

## 2020-07-26 ENCOUNTER — Ambulatory Visit: Payer: Medicare HMO

## 2020-07-26 DIAGNOSIS — Z23 Encounter for immunization: Secondary | ICD-10-CM

## 2020-07-26 NOTE — Progress Notes (Signed)
   Covid-19 Vaccination Clinic  Name:  Monica Ryan    MRN: [id number] DOB: 1956-05-03  07/26/2020  [name] was observed post Covid-19 immunization for 15 minutes without incident. She was provided with Vaccine Information Sheet and instruction to access the V-Safe system.   [name] was instructed to call 911 with any severe reactions post vaccine: Marland Kitchen Difficulty breathing  . Swelling of face and throat  . A fast heartbeat  . A bad rash all over body  . Dizziness and weakness

## 2020-08-24 ENCOUNTER — Telehealth: Payer: Self-pay | Admitting: *Deleted

## 2020-08-24 NOTE — Chronic Care Management (AMB) (Signed)
  Care Management   Note  08/24/2020 Name: Monica Ryan MRN: [id number] DOB: 03-Jul-1956  Monica Ryan is a 64 y.o. year old female who is a primary care patient of Dorothyann Peng, MD and is actively engaged with the care management team. I reached out to [name] by phone today to assist with re-scheduling an initial visit with the Pharmacist.  Follow up plan: Unsuccessful telephone outreach attempt made. The care management team will reach out to the patient again over the next 7 days. If patient returns call to provider office, please advise to call Embedded Care Management Care Guide Gwenevere Ghazi at [phone]  [facility] Guide, Embedded Care Coordination [facility] Management

## 2020-08-29 NOTE — Chronic Care Management (AMB) (Signed)
  Care Management   Note  08/29/2020 Name: Monica Ryan MRN: [id number] DOB: 08-04-1956  Monica Ryan is a 64 y.o. year old female who is a primary care patient of Dorothyann Peng, MD and is actively engaged with the care management team. I reached out to [name] by phone today to assist with re-scheduling an initial visit with the Pharmacist.  Follow up plan: Patient declines further follow up and engagement by the care management team. Appropriate care team members and provider have been notified via electronic communication. The care management team is available to follow up with the patient after provider conversation with the patient regarding recommendation for care management engagement and subsequent re-referral to the care management team.   [facility] Guide, Embedded Care Coordination [facility]  Care Management

## 2020-08-30 ENCOUNTER — Telehealth: Payer: Medicare HMO

## 2020-09-05 ENCOUNTER — Other Ambulatory Visit: Payer: Self-pay | Admitting: Internal Medicine

## 2020-09-13 DIAGNOSIS — E119 Type 2 diabetes mellitus without complications: Secondary | ICD-10-CM | POA: Diagnosis not present

## 2020-09-19 LAB — HM DIABETES EYE EXAM

## 2020-09-23 ENCOUNTER — Other Ambulatory Visit: Payer: Self-pay | Admitting: Internal Medicine

## 2020-09-26 ENCOUNTER — Encounter: Payer: Self-pay | Admitting: Internal Medicine

## 2020-09-30 ENCOUNTER — Encounter: Payer: Self-pay | Admitting: Internal Medicine

## 2020-10-04 ENCOUNTER — Other Ambulatory Visit: Payer: Self-pay

## 2020-10-04 ENCOUNTER — Ambulatory Visit: Payer: Self-pay

## 2020-10-04 ENCOUNTER — Encounter: Payer: Self-pay | Admitting: Internal Medicine

## 2020-10-04 ENCOUNTER — Ambulatory Visit (INDEPENDENT_AMBULATORY_CARE_PROVIDER_SITE_OTHER): Payer: Medicare HMO | Admitting: Internal Medicine

## 2020-10-04 VITALS — BP 116/80 | HR 85 | Temp 98.2°F | Ht 60.4 in | Wt 189.4 lb

## 2020-10-04 DIAGNOSIS — N182 Chronic kidney disease, stage 2 (mild): Secondary | ICD-10-CM | POA: Diagnosis not present

## 2020-10-04 DIAGNOSIS — Z23 Encounter for immunization: Secondary | ICD-10-CM

## 2020-10-04 DIAGNOSIS — E559 Vitamin D deficiency, unspecified: Secondary | ICD-10-CM | POA: Diagnosis not present

## 2020-10-04 DIAGNOSIS — E1122 Type 2 diabetes mellitus with diabetic chronic kidney disease: Secondary | ICD-10-CM | POA: Diagnosis not present

## 2020-10-04 DIAGNOSIS — Z6836 Body mass index (BMI) 36.0-36.9, adult: Secondary | ICD-10-CM

## 2020-10-04 DIAGNOSIS — Z0000 Encounter for general adult medical examination without abnormal findings: Secondary | ICD-10-CM | POA: Diagnosis not present

## 2020-10-04 DIAGNOSIS — I129 Hypertensive chronic kidney disease with stage 1 through stage 4 chronic kidney disease, or unspecified chronic kidney disease: Secondary | ICD-10-CM

## 2020-10-04 LAB — POCT URINALYSIS DIPSTICK
Bilirubin, UA: NEGATIVE
Glucose, UA: POSITIVE — AB
Ketones, UA: NEGATIVE
Leukocytes, UA: NEGATIVE
Nitrite, UA: NEGATIVE
Protein, UA: NEGATIVE
Spec Grav, UA: 1.025 (ref 1.010–1.025)
Urobilinogen, UA: 0.2 E.U./dL
pH, UA: 5.5 (ref 5.0–8.0)

## 2020-10-04 LAB — POCT UA - MICROALBUMIN
Albumin/Creatinine Ratio, Urine, POC: <30
Creatinine, POC: 300 mg/dL
Microalbumin Ur, POC: 10 mg/L

## 2020-10-04 NOTE — Progress Notes (Addendum)
Subjective:    Monica Ryan is a 65 y.o. female who presents for a Welcome to Medicare exam. She is aware this includes a full physical exam. She reports compliance with all meds. She denies headaches, chest pain and shortness of breath.  Diabetes She presents for her follow-up diabetic visit. She has type 2 diabetes mellitus. Her disease course has been stable. There are no hypoglycemic associated symptoms. Pertinent negatives for diabetes include no blurred vision and no chest pain. There are no hypoglycemic complications. Risk factors for coronary artery disease include diabetes mellitus, dyslipidemia, hypertension and post-menopausal. She is following a diabetic diet. She participates in exercise three times a week. Her breakfast blood glucose is taken between 7-8 am. Her breakfast blood glucose range is generally 90-110 mg/dl. An ACE inhibitor/angiotensin II receptor blocker is being taken. Eye exam is current.  Hypertension This is a chronic problem. The current episode started more than 1 year ago. The problem is controlled. Pertinent negatives include no blurred vision, chest pain, palpitations or shortness of breath.    Review of Systems Review of Systems  Constitutional: Negative.   HENT: Negative.   Eyes: Negative.  Negative for blurred vision.  Respiratory: Negative.  Negative for shortness of breath.   Cardiovascular: Negative.  Negative for chest pain and palpitations.  Gastrointestinal: Negative.   Genitourinary: Negative.   Musculoskeletal: Negative.   Skin: Negative.   Neurological: Negative.   Endo/Heme/Allergies: Negative.   Psychiatric/Behavioral: Negative.        Objective:    Today's Vitals   10/04/20 1122  BP: 116/80  Pulse: 85  Temp: 98.2 F (36.8 C)  TempSrc: Oral  Weight: 189 lb 6.4 oz (85.9 kg)  Height: 5' 0.4" (1.534 m)  PainSc: 0-No pain  Body mass index is 36.5 kg/m.  Medications Outpatient Encounter Medications as of 10/04/2020   Medication Sig  . Alcohol Swabs (ALCOHOL PADS) 70 % PADS Use as instructed to check blood sugars 1 time per day dx: e11.65  . amLODipine (NORVASC) 5 MG tablet TAKE 1 TABLET BY MOUTH EVERY DAY  . aspirin EC 81 MG tablet Take 81 mg by mouth daily.  . Blood Glucose Calibration (TRUE METRIX LEVEL 1) Low SOLN Use as directed dx: e11.65  . Blood Glucose Calibration (TRUE METRIX LEVEL 2) Normal SOLN Use as directed dx: e11.65  . Blood Glucose Calibration (TRUE METRIX LEVEL 3) High SOLN Use as directed dx: e11.65  . Blood Glucose Monitoring Suppl (TRUE METRIX METER) w/Device KIT USE AS DIRECTED  . dapagliflozin propanediol (FARXIGA) 10 MG TABS tablet TAKE 1 TABLET BY MOUTH EVERY DAY IN THE MORNING  . glucose blood (TRUE METRIX BLOOD GLUCOSE TEST) test strip USE AS INSTRUCTED TO CHECK BLOOD SUGAR 1 TIME PER DAY  . lisinopril-hydrochlorothiazide (ZESTORETIC) 20-12.5 MG tablet TAKE 1 TABLET EVERY DAY  . OZEMPIC, 1 MG/DOSE, 2 MG/1.5ML SOPN INJECT 1 MG INTO THE SKIN ONCE A WEEK.  . simvastatin (ZOCOR) 40 MG tablet TAKE 1 TABLET EVERY DAY  . TRUEplus Lancets 28G MISC Use as instructed to check blood sugars 1 time per day dx: e11.65   No facility-administered encounter medications on file as of 10/04/2020.     History: Past Medical History:  Diagnosis Date  . Diabetes mellitus without complication ([place])   . Hyperlipemia   . Hypertension    Past Surgical History:  Procedure Laterality Date  . BREAST BIOPSY Right 2000  . BREAST EXCISIONAL BIOPSY Bilateral 06/14/1999   benign  . BREAST EXCISIONAL  BIOPSY Left 1974   benign  . BREAST LUMPECTOMY  1974   right-negative  . COLONOSCOPY    . SHOULDER ARTHROSCOPY WITH ROTATOR CUFF REPAIR AND SUBACROMIAL DECOMPRESSION  07/29/2012   Procedure: SHOULDER ARTHROSCOPY WITH ROTATOR CUFF REPAIR AND SUBACROMIAL DECOMPRESSION;  Surgeon: Wyn Forster., MD;  Location: [facility];  Service: Orthopedics;  Laterality: Right;  Arthroscopy Subacromial  Decompression, Distal Clavicle Resection, Arthroscopic Subscapularis Repair, Open Repair of Supraspinatus and Infraspinatus    . TUBAL LIGATION    . TUMOR EXCISION     right jaw    Family History  Problem Relation Age of Onset  . Diabetes Mother   . Alzheimer's disease Father   . Diabetes Sister   . Diabetes Brother    Social History   Occupational History  . Not on file  Tobacco Use  . Smoking status: Never Smoker  . Smokeless tobacco: Never Used  . Tobacco comment: n/a  Vaping Use  . Vaping Use: Never used  Substance and Sexual Activity  . Alcohol use: Yes    Comment: occ  . Drug use: Never  . Sexual activity: Not on file    Tobacco Counseling Counseling given: No Comment: n/a   Immunizations and Health Maintenance Immunization History  Administered Date(s) Administered  . DTaP 02/06/2013  . Fluad Quad(high Dose 65+) 10/04/2020  . Influenza,inj,Quad PF,6+ Mos 07/15/2019  . Influenza-Unspecified 06/17/2018  . Moderna SARS-COV2 Booster Vaccination 07/26/2020  . Moderna Sars-Covid-2 Vaccination 10/17/2019, 11/21/2019  . Pneumococcal Conjugate-13 01/16/2020  . Pneumococcal-Unspecified 04/20/2014  . Td 07/10/2006   There are no preventive care reminders to display for this patient.  Activities of Daily Living In your present state of health, do you have any difficulty performing the following activities: 10/04/2020 10/04/2020  Hearing? N N  Vision? - N  Difficulty concentrating or making decisions? - N  Walking or climbing stairs? - N  Dressing or bathing? - N  Doing errands, shopping? - N  Quarry manager and eating ? N -  Using the Toilet? N -  In the past six months, have you accidently leaked urine? N -  Do you have problems with loss of bowel control? N -  Managing your Medications? N -  Managing your Finances? N -  Housekeeping or managing your Housekeeping? N -  Some recent data might be hidden    Physical Exam   Physical Exam Vitals and nursing  note reviewed.  Constitutional:      Appearance: Normal appearance.  HENT:     Head: Normocephalic and atraumatic.     Right Ear: Tympanic membrane, ear canal and external ear normal.     Left Ear: Tympanic membrane, ear canal and external ear normal.     Nose:     Comments: Deferred, masked    Mouth/Throat:     Comments: Deferred, masked Eyes:     Extraocular Movements: Extraocular movements intact.     Conjunctiva/sclera: Conjunctivae normal.     Pupils: Pupils are equal, round, and reactive to light.  Cardiovascular:     Rate and Rhythm: Normal rate and regular rhythm.     Pulses: Normal pulses.          Dorsalis pedis pulses are 2+ on the right side and 2+ on the left side.     Heart sounds: Normal heart sounds.  Pulmonary:     Effort: Pulmonary effort is normal.     Breath sounds: Normal breath sounds.  Chest:  Breasts:  Tanner Score is 5.     Right: Normal.     Left: Normal.    Abdominal:     General: Abdomen is flat.  Genitourinary:    Comments: Deferred  Musculoskeletal:        General: Normal range of motion.     Cervical back: Normal range of motion.  Feet:     Right foot:     Protective Sensation: 5 sites tested. 5 sites sensed.     Skin integrity: Skin integrity normal.     Toenail Condition: Right toenails are normal.     Left foot:     Protective Sensation: 5 sites tested. 5 sites sensed.     Skin integrity: Skin integrity normal.     Toenail Condition: Left toenails are normal.  Skin:    General: Skin is warm.     Capillary Refill: Capillary refill takes less than 2 seconds.  Neurological:     Mental Status: She is alert and oriented to person, place, and time.  Psychiatric:        Mood and Affect: Affect normal.   (optional), or other factors deemed appropriate based on the beneficiary's medical and social history and current clinical standards.  Advanced Directives: Does Patient Have a Medical Advance Directive?: No Does patient want to  make changes to medical advance directive?: Yes (MAU/Ambulatory/Procedural Areas - Information given) Would patient like information on creating a medical advance directive?: Yes (MAU/Ambulatory/Procedural Areas - Information given)    Assessment:    This is a routine wellness examination for this patient .   Vision/Hearing screen  Hearing Screening   Method: Audiometry   '125Hz'$  $Remo'250Hz'gcdLa$'500Hz'$'1000Hz'$'2000Hz'$'3000Hz'$'4000Hz'$'6000Hz'$'8000Hz'$   Right ear:  Pass Pass Pass Pass  Fail    Left ear:  Pass Pass Pass Pass  Pass      Visual Acuity Screening   Right eye Left eye Both eyes  Without correction:     With correction: $RemoveBeforeDE'20/30 20/20 20/20 'PXjhdKSqNlSZHPI$    Dietary issues and exercise activities discussed:  Current Exercise Habits: Home exercise routine, Type of exercise: walking, Time (Minutes): 30, Frequency (Times/Week): 4, Weekly Exercise (Minutes/Week): 120, Intensity: Moderate, Exercise limited by: None identified  Goals    . Weight (lb) < 200 lb (90.7 kg)     She would like to lose 30  pounds by December 2022.   Plans to resume her regular walking regimen, increase water intake.   Incorporate more vegetables into her diet.       Depression Screen PHQ 2/9 Scores 10/04/2020 07/15/2019 04/14/2019 01/13/2019  PHQ - 2 Score 0 0 0 0     Fall Risk Fall Risk  10/04/2020  Falls in the past year? 0  Number falls in past yr: 0  Injury with Fall? 0    Cognitive Function: MMSE - Mini Mental State Exam 10/04/2020  Orientation to time 5  Orientation to Place 5  Registration 3  Attention/ Calculation 5  Recall 3  Language- name 2 objects 2  Language- repeat 1  Language- follow 3 step command 3  Language- read & follow direction 1  Write a sentence 1  Copy design 1  Total score 30     6CIT Screen 10/04/2020 10/04/2020  What Year? 0 points 0 points  What month? 0 points 0 points  What time? 0 points 0 points  Count back from 20 0 points -  Months in reverse 0 points -  Repeat phrase 4 points -  Total Score 4 -    Patient Care Team: [name], MD as PCP - General (Internal Medicine)     Plan:    1. Medicare welcome exam The annual wellness visit was performed including discussion of advanced directives, assessment of functional status and cognitive function. EKG was performed, NSR w/o acute changes. A full exam was performed. Importance of monthly self breast exams was discussed with the patient.  PATIENT IS ADVISED TO GET 30-45 MINUTES REGULAR EXERCISE NO LESS THAN FOUR TO FIVE DAYS PER WEEK - BOTH WEIGHTBEARING EXERCISES AND AEROBIC ARE RECOMMENDED.  PATIENT IS ADVISED TO FOLLOW A HEALTHY DIET WITH AT LEAST SIX FRUITS/VEGGIES PER DAY, DECREASE INTAKE OF RED MEAT, AND TO INCREASE FISH INTAKE TO TWO DAYS PER WEEK.  MEATS/FISH SHOULD NOT BE FRIED, BAKED OR BROILED IS PREFERABLE.  I SUGGEST WEARING SPF 50 SUNSCREEN ON EXPOSED PARTS AND ESPECIALLY WHEN IN THE DIRECT SUNLIGHT FOR AN EXTENDED PERIOD OF TIME.  PLEASE AVOID FAST FOOD RESTAURANTS AND INCREASE YOUR WATER INTAKE.  2. Type 2 diabetes mellitus with stage 2 chronic kidney disease, without long-term current use of insulin ([facility]) Comments: Diabetic foot exam was performed. She is currently on SGLT2 therapy along with GLP agonist therapy. Both of these agents offer cardiovascular benefit. I DISCUSSED WITH THE PATIENT AT LENGTH REGARDING THE GOALS OF GLYCEMIC CONTROL AND POSSIBLE LONG-TERM COMPLICATIONS.  I  ALSO STRESSED THE IMPORTANCE OF COMPLIANCE WITH HOME GLUCOSE MONITORING, DIETARY RESTRICTIONS INCLUDING AVOIDANCE OF SUGARY DRINKS/PROCESSED FOODS,  ALONG WITH REGULAR EXERCISE.  I  ALSO STRESSED THE IMPORTANCE OF ANNUAL EYE EXAMS, SELF FOOT CARE AND COMPLIANCE WITH OFFICE VISITS.  - CMP14+EGFR - CBC - Lipid panel - Hemoglobin A1c - TSH - EKG 12-Lead - POCT Urinalysis Dipstick (81002) - POCT UA - Microalbumin  3. Hypertensive nephropathy Comments: Chronic, well controlled. EKG performed, NSR w/o acute changes. Advised to  follow low sodium diet. She wil rto in four to six months for re-evaluation.   4. Vitamin D deficiency disease Comments: I will check vitamin D levle and supplement as needed.  - Vitamin D (25 hydroxy)  5. Class 2 severe obesity due to excess calories with serious comorbidity and body mass index (BMI) of 36.0 to 36.9 in adult [facility]) Comments: She is encouraged to strive for BMI less than 30 to decrease cardiac risk. Advised to aim for at least 150 minutes of exercise per week. She is encouraged to strive to reach target BMI by year end.   6. Immunization due Comments: She was given high dose flu vaccine.  - Flu Vaccine QUAD High Dose(Fluad)  I have personally reviewed and noted the following in the patient's chart:   . Medical and social history . Use of alcohol, tobacco or illicit drugs  . Current medications and supplements . Functional ability and status . Nutritional status . Physical activity . Advanced directives . List of other physicians . Hospitalizations, surgeries, and ER visits in previous 12 months . Vitals . Screenings to include cognitive, depression, and falls . Referrals and appointments  In addition, I have reviewed and discussed with patient certain preventive protocols, quality metrics, and best practice recommendations. A written personalized care plan for preventive services as well as general preventive health recommendations were provided to patient.     [name], MD 10/16/2020

## 2020-10-04 NOTE — Patient Instructions (Addendum)
  Monica Ryan , Thank you for taking time to come for your Medicare Wellness Visit. I appreciate your ongoing commitment to your health goals. Please review the following plan we discussed and let me know if I can assist you in the future.   These are the goals we discussed: Goals    . Weight (lb) < 200 lb (90.7 kg)     She would like to lose 30  pounds by December 2022.   Plans to resume her regular walking regimen, increase water intake.   Incorporate more vegetables into her diet.        This is a list of the screening recommended for you and due dates:  Health Maintenance  Topic Date Due  . Pneumonia vaccines (2 of 2 - PPSV23) 01/15/2021  . Hemoglobin A1C  04/03/2021  . Eye exam for diabetics  09/19/2021  . Pap Smear  09/30/2021  . Complete foot exam   10/04/2021  . Mammogram  04/22/2022  . Tetanus Vaccine  02/07/2023  . Colon Cancer Screening  01/09/2025  . Flu Shot  Completed  . DEXA scan (bone density measurement)  Completed  . COVID-19 Vaccine  Completed  .  Hepatitis C: One time screening is recommended by Center for Disease Control  (CDC) for  adults born from [birth year] through 1966.   Completed  . HIV Screening  Completed

## 2020-10-05 LAB — CMP14+EGFR
ALT: 13 IU/L (ref 0–32)
AST: 19 IU/L (ref 0–40)
Albumin/Globulin Ratio: 1.4 (ref 1.2–2.2)
Albumin: 4.4 g/dL (ref 3.8–4.8)
Alkaline Phosphatase: 121 IU/L (ref 44–121)
BUN/Creatinine Ratio: 13 (ref 12–28)
BUN: 12 mg/dL (ref 8–27)
Bilirubin Total: 0.4 mg/dL (ref 0.0–1.2)
CO2: 25 mmol/L (ref 20–29)
Calcium: 9.5 mg/dL (ref 8.7–10.3)
Chloride: 99 mmol/L (ref 96–106)
Creatinine, Ser: 0.94 mg/dL (ref 0.57–1.00)
GFR calc Af Amer: 74 mL/min/{1.73_m2} (ref >59)
GFR calc non Af Amer: 64 mL/min/{1.73_m2} (ref >59)
Globulin, Total: 3.1 g/dL (ref 1.5–4.5)
Glucose: 79 mg/dL (ref 65–99)
Potassium: 3.6 mmol/L (ref 3.5–5.2)
Sodium: 139 mmol/L (ref 134–144)
Total Protein: 7.5 g/dL (ref 6.0–8.5)

## 2020-10-05 LAB — LIPID PANEL
Chol/HDL Ratio: 2.3 ratio (ref 0.0–4.4)
Cholesterol, Total: 181 mg/dL (ref 100–199)
HDL: 79 mg/dL (ref >39)
LDL Chol Calc (NIH): 83 mg/dL (ref 0–99)
Triglycerides: 108 mg/dL (ref 0–149)
VLDL Cholesterol Cal: 19 mg/dL (ref 5–40)

## 2020-10-05 LAB — HEMOGLOBIN A1C
Est. average glucose Bld gHb Est-mCnc: 151 mg/dL
Hgb A1c MFr Bld: 6.9 % — ABNORMAL HIGH (ref 4.8–5.6)

## 2020-10-05 LAB — VITAMIN D 25 HYDROXY (VIT D DEFICIENCY, FRACTURES): Vit D, 25-Hydroxy: 39.9 ng/mL (ref 30.0–100.0)

## 2020-10-05 LAB — CBC
Hematocrit: 42.5 % (ref 34.0–46.6)
Hemoglobin: 13.8 g/dL (ref 11.1–15.9)
MCH: 27.3 pg (ref 26.6–33.0)
MCHC: 32.5 g/dL (ref 31.5–35.7)
MCV: 84 fL (ref 79–97)
Platelets: 286 10*3/uL (ref 150–450)
RBC: 5.06 x10E6/uL (ref 3.77–5.28)
RDW: 13.1 % (ref 11.7–15.4)
WBC: 7.3 10*3/uL (ref 3.4–10.8)

## 2020-10-05 LAB — TSH: TSH: 1.21 u[IU]/mL (ref 0.450–4.500)

## 2020-10-10 ENCOUNTER — Ambulatory Visit: Payer: Self-pay | Admitting: Internal Medicine

## 2020-10-14 ENCOUNTER — Ambulatory Visit: Payer: Self-pay | Admitting: Internal Medicine

## 2020-10-14 ENCOUNTER — Encounter: Payer: Self-pay | Admitting: Internal Medicine

## 2020-10-16 ENCOUNTER — Encounter: Payer: Self-pay | Admitting: Internal Medicine

## 2020-11-16 ENCOUNTER — Encounter: Payer: Self-pay | Admitting: Internal Medicine

## 2020-12-04 ENCOUNTER — Other Ambulatory Visit: Payer: Self-pay | Admitting: Internal Medicine

## 2020-12-12 ENCOUNTER — Other Ambulatory Visit: Payer: Self-pay | Admitting: Internal Medicine

## 2020-12-23 ENCOUNTER — Encounter: Payer: Self-pay | Admitting: Internal Medicine

## 2021-01-09 ENCOUNTER — Other Ambulatory Visit: Payer: Self-pay | Admitting: Internal Medicine

## 2021-01-13 ENCOUNTER — Telehealth: Payer: Self-pay

## 2021-01-13 NOTE — Chronic Care Management (AMB) (Addendum)
    Chronic Care Management Pharmacy Assistant   Name: Monica Ryan  MRN: [id number] DOB: [birth year]   Reason for Encounter: Disease State-General    Recent office visits:  10/04/20- [name], MD(PCP)  Recent consult visits:  09/13/20- Fonnie [facility] visits:  None in previous 6 months  Medications: Outpatient Encounter Medications as of 01/13/2021  Medication Sig   Alcohol Swabs (ALCOHOL PADS) 70 % PADS Use as instructed to check blood sugars 1 time per day dx: e11.65   amLODipine (NORVASC) 5 MG tablet TAKE 1 TABLET BY MOUTH EVERY DAY   aspirin EC 81 MG tablet Take 81 mg by mouth daily.   Blood Glucose Calibration (TRUE METRIX LEVEL 1) Low SOLN Use as directed dx: e11.65   Blood Glucose Calibration (TRUE METRIX LEVEL 2) Normal SOLN Use as directed dx: e11.65   Blood Glucose Calibration (TRUE METRIX LEVEL 3) High SOLN Use as directed dx: e11.65   Blood Glucose Monitoring Suppl (TRUE METRIX METER) w/Device KIT USE AS DIRECTED   FARXIGA 10 MG TABS tablet TAKE 1 TABLET BY MOUTH EVERY DAY IN THE MORNING   glucose blood (TRUE METRIX BLOOD GLUCOSE TEST) test strip USE AS INSTRUCTED TO CHECK BLOOD SUGAR 1 TIME PER DAY   lisinopril-hydrochlorothiazide (ZESTORETIC) 20-12.5 MG tablet TAKE 1 TABLET EVERY DAY   OZEMPIC, 1 MG/DOSE, 2 MG/1.5ML SOPN INJECT 1 MG INTO THE SKIN ONCE A WEEK.   simvastatin (ZOCOR) 40 MG tablet TAKE 1 TABLET EVERY DAY   TRUEplus Lancets 28G MISC USE AS INSTRUCTED TO CHECK BLOOD SUGARS 1 TIME PER DAY   No facility-administered encounter medications on file as of 01/13/2021.    Have you had any problems recently with your health? Patient states she is not having any problems at this time.  Have you had any problems with your pharmacy? Patient states she does not have any problems with her pharmacy.  What issues or side effects are you having with your medications? Patient states she is not experiencing any side effects or  issues.  What would you like me to pass along to [facility] for them to help you with?  Patient states there is nothing at this time but will call us if needed.  What can we do to take care of you better? Patient states there is nothing at this time.  Star Rating Drugs: Farxiga 10 mg last filled 10/09/19 [facility] Lisinopril-HCT 20-12.5 mg last filled 12/03/20 90 [facility] ozempic 2 mg/1.5 ml last filled 09/24/19 [facility] Simvastatin 40 mg last filled 12/06/20 90 DS   [place] Pharmacist Assistant [phone]  I have reviewed the care management and care coordination activities outlined in this encounter and I am certifying that I agree with the content of this note. No further action required.  Mayford Knife, RPH  8:18 AM

## 2021-02-03 ENCOUNTER — Telehealth: Payer: Self-pay

## 2021-02-03 NOTE — Chronic Care Management (AMB) (Addendum)
Chronic Care Management Pharmacy Assistant   Name: Monica Ryan  MRN: [id number] DOB: [birth year]   Reason for Encounter: Disease State/ Diabetes, Hypertension    Recent office visits:  02-07-2021 Monica Chard, MD. Shingrix injection given.   02-13-2021 Monica Chard, MD Shingrix injection given.  Recent consult visits:  None  Hospital visits:  None in previous 6 months  Medications: Outpatient Encounter Medications as of 02/03/2021  Medication Sig  . Alcohol Swabs (ALCOHOL PADS) 70 % PADS Use as instructed to check blood sugars 1 time per day dx: e11.65  . amLODipine (NORVASC) 5 MG tablet TAKE 1 TABLET BY MOUTH EVERY DAY  . aspirin EC 81 MG tablet Take 81 mg by mouth daily.  . Blood Glucose Calibration (TRUE METRIX LEVEL 1) Low SOLN Use as directed dx: e11.65  . Blood Glucose Calibration (TRUE METRIX LEVEL 2) Normal SOLN Use as directed dx: e11.65  . Blood Glucose Calibration (TRUE METRIX LEVEL 3) High SOLN Use as directed dx: e11.65  . Blood Glucose Monitoring Suppl (TRUE METRIX METER) w/Device KIT USE AS DIRECTED  . FARXIGA 10 MG TABS tablet TAKE 1 TABLET BY MOUTH EVERY DAY IN THE MORNING  . glucose blood (TRUE METRIX BLOOD GLUCOSE TEST) test strip USE AS INSTRUCTED TO CHECK BLOOD SUGAR 1 TIME PER DAY  . lisinopril-hydrochlorothiazide (ZESTORETIC) 20-12.5 MG tablet TAKE 1 TABLET EVERY DAY  . OZEMPIC, 1 MG/DOSE, 2 MG/1.5ML SOPN INJECT 1 MG INTO THE SKIN ONCE A WEEK.  . simvastatin (ZOCOR) 40 MG tablet TAKE 1 TABLET EVERY DAY  . TRUEplus Lancets 28G MISC USE AS INSTRUCTED TO CHECK BLOOD SUGARS 1 TIME PER DAY   No facility-administered encounter medications on file as of 02/03/2021.   Reviewed chart prior to disease state call. Spoke with patient regarding BP  Recent Office Vitals: BP Readings from Last 3 Encounters:  10/04/20 116/80  05/31/20 128/72  05/09/20 140/74   Pulse Readings from Last 3 Encounters:  10/04/20 85  05/31/20 81  05/09/20 81    Wt  Readings from Last 3 Encounters:  10/04/20 189 lb 6.4 oz (85.9 kg)  05/31/20 191 lb (86.6 kg)  05/09/20 190 lb 12.8 oz (86.5 kg)     Kidney Function Lab Results  Component Value Date/Time   CREATININE 0.94 10/04/2020 02:20 PM   CREATININE 0.98 05/09/2020 09:19 AM   GFRNONAA 64 10/04/2020 02:20 PM   GFRAA 74 10/04/2020 02:20 PM    BMP Latest Ref Rng & Units 10/04/2020 05/09/2020 01/05/2020  Glucose 65 - 99 mg/dL 79 111(H) 92  BUN 8 - 27 mg/dL $Remove'12 23 17  'vFwGAiq$ Creatinine 0.57 - 1.00 mg/dL 0.94 0.98 1.03(H)  BUN/Creat Ratio 12 - $Re'28 13 23 17  'PRP$ Sodium 134 - 144 mmol/L 139 141 143  Potassium 3.5 - 5.2 mmol/L 3.6 4.1 3.8  Chloride 96 - 106 mmol/L 99 103 104  CO2 20 - 29 mmol/L $RemoveB'25 23 23  'xLWXXPbu$ Calcium 8.7 - 10.3 mg/dL 9.5 9.5 9.6    . Current antihypertensive regimen:  o Lisinopril-HCTZ 20-12.5 mg daily o Amlodipine $RemoveBefo'5mg'hSGUNpiQKyR$  daily  . How often are you checking your Blood Pressure? weekly   . Current home BP readings: Patient stated none   . What recent interventions/DTPs have been made by any provider to improve Blood Pressure control since last CPP Visit: Patient stated she is taking medications as directed.  . Any recent hospitalizations or ED visits since last visit with CPP? No   . What diet changes have been made  to improve Blood Pressure Control?  o Patient stated she is limiting her salt intake.  . What exercise is being done to improve your Blood Pressure Control?  o Patients states she walks at least 30 mins daily  Adherence Review: Is the patient currently on ACE/ARB medication? Yes Does the patient have >5 day gap between last estimated fill dates? No   Recent Relevant Labs: Lab Results  Component Value Date/Time   HGBA1C 6.9 (H) 10/04/2020 02:20 PM   HGBA1C 6.9 (H) 05/09/2020 09:19 AM   MICROALBUR 10 10/04/2020 12:29 PM   MICROALBUR 10 10/05/2019 10:01 AM    Kidney Function Lab Results  Component Value Date/Time   CREATININE 0.94 10/04/2020 02:20 PM   CREATININE 0.98  05/09/2020 09:19 AM   GFRNONAA 64 10/04/2020 02:20 PM   GFRAA 74 10/04/2020 02:20 PM    . Current antihyperglycemic regimen:  o Simvastatin 40mg  daily o Ozempic 1mg  weekly o Farxiga 10mg  daily  . What recent interventions/DTPs have been made to improve glycemic control:  o Patient stated she is taking medications as directed and checking blood sugars daily.  . Have there been any recent hospitalizations or ED visits since last visit with CPP? No   . Patient denies hypoglycemic symptoms  . Patient denies hyperglycemic symptoms  . How often are you checking your blood sugar? once daily   . What are your blood sugars ranging?  o Fasting: 123 o Before meals: None o After meals: None o Bedtime: None . During the week, how often does your blood glucose drop below 70? Never   . Are you checking your feet daily/regularly? Patient states daily  Adherence Review: Is the patient currently on a STATIN medication? Yes Is the patient currently on ACE/ARB medication? Yes Does the patient have >5 day gap between last estimated fill dates? No  NOTES: Patient stated she doesn't feel the need to make a follow up appointment with Monica Ryan RPH at this time. Will follow up with patient next month.  Star Rating Drugs: Simvastatin 40mg - Last filled 12-06-2020 [facility] Pharmacy unknown (patient reported [facility]) Ozempic 1mg - Last filled 09-24-2019 [facility] Lisinopril-HCTZ 20-12.5mg - Last filled 12-03-2020 [facility] Pharmacy unknown  Farxiga 10mg - Last filled 10-09-2019 [facility]   [place]  [phone]

## 2021-02-05 ENCOUNTER — Other Ambulatory Visit: Payer: Self-pay | Admitting: Internal Medicine

## 2021-02-07 ENCOUNTER — Encounter: Payer: Self-pay | Admitting: Internal Medicine

## 2021-02-07 ENCOUNTER — Ambulatory Visit (INDEPENDENT_AMBULATORY_CARE_PROVIDER_SITE_OTHER): Payer: Medicare HMO | Admitting: Internal Medicine

## 2021-02-07 ENCOUNTER — Other Ambulatory Visit: Payer: Self-pay

## 2021-02-07 VITALS — BP 114/66 | HR 78 | Temp 98.0°F | Ht 60.4 in | Wt 187.2 lb

## 2021-02-07 DIAGNOSIS — Z6836 Body mass index (BMI) 36.0-36.9, adult: Secondary | ICD-10-CM | POA: Diagnosis not present

## 2021-02-07 DIAGNOSIS — N182 Chronic kidney disease, stage 2 (mild): Secondary | ICD-10-CM | POA: Diagnosis not present

## 2021-02-07 DIAGNOSIS — M722 Plantar fascial fibromatosis: Secondary | ICD-10-CM

## 2021-02-07 DIAGNOSIS — Z23 Encounter for immunization: Secondary | ICD-10-CM

## 2021-02-07 DIAGNOSIS — Z0100 Encounter for examination of eyes and vision without abnormal findings: Secondary | ICD-10-CM | POA: Diagnosis not present

## 2021-02-07 DIAGNOSIS — I129 Hypertensive chronic kidney disease with stage 1 through stage 4 chronic kidney disease, or unspecified chronic kidney disease: Secondary | ICD-10-CM

## 2021-02-07 DIAGNOSIS — E1122 Type 2 diabetes mellitus with diabetic chronic kidney disease: Secondary | ICD-10-CM | POA: Diagnosis not present

## 2021-02-07 LAB — BMP8+EGFR
BUN/Creatinine Ratio: 16 (ref 12–28)
BUN: 16 mg/dL (ref 8–27)
CO2: 23 mmol/L (ref 20–29)
Calcium: 9.4 mg/dL (ref 8.7–10.3)
Chloride: 100 mmol/L (ref 96–106)
Creatinine, Ser: 0.97 mg/dL (ref 0.57–1.00)
Glucose: 110 mg/dL — ABNORMAL HIGH (ref 65–99)
Potassium: 4.1 mmol/L (ref 3.5–5.2)
Sodium: 140 mmol/L (ref 134–144)
eGFR: 64 mL/min/{1.73_m2} (ref >59)

## 2021-02-07 LAB — HEMOGLOBIN A1C
Est. average glucose Bld gHb Est-mCnc: 148 mg/dL
Hgb A1c MFr Bld: 6.8 % — ABNORMAL HIGH (ref 4.8–5.6)

## 2021-02-07 MED ORDER — SIMVASTATIN 40 MG PO TABS: 40.0000 mg | ORAL_TABLET | Freq: Every day | ORAL | 1 refills | Status: DC

## 2021-02-07 MED ORDER — SHINGRIX 50 MCG/0.5ML IM SUSR: 0.5000 mL | Freq: Once | INTRAMUSCULAR | 0 refills | Status: AC

## 2021-02-07 MED ORDER — LISINOPRIL-HYDROCHLOROTHIAZIDE 20-12.5 MG PO TABS: 1.0000 | ORAL_TABLET | Freq: Every day | ORAL | 2 refills | Status: DC

## 2021-02-07 NOTE — Progress Notes (Signed)
I,Katawbba Wiggins,acting as a Education administrator for [name], MD.,have documented all relevant documentation on the behalf of [name], MD,as directed by  [name], MD while in the presence of [name], MD.  This visit occurred during the SARS-CoV-2 public health emergency.  Safety protocols were in place, including screening questions prior to the visit, additional usage of staff PPE, and extensive cleaning of exam room while observing appropriate contact time as indicated for disinfecting solutions.  Subjective:     Patient ID: Monica Ryan , female    DOB: [birth year] , 65 y.o.   MRN: [id number]   Chief Complaint  Patient presents with  . Diabetes  . Hypertension    HPI  She is here today for diabetes/BP check. She reports compliance with meds. Denies headaches, chest pain and shortness of breath.   Diabetes She presents for her follow-up diabetic visit. She has type 2 diabetes mellitus. Her disease course has been stable. There are no hypoglycemic associated symptoms. Pertinent negatives for diabetes include no blurred vision. There are no hypoglycemic complications. Risk factors for coronary artery disease include diabetes mellitus, dyslipidemia, hypertension and post-menopausal. She is following a diabetic diet. She participates in exercise three times a week. Her breakfast blood glucose is taken between 7-8 am. Her breakfast blood glucose range is generally 90-110 mg/dl. An ACE inhibitor/angiotensin II receptor blocker is being taken. Eye exam is current.  Hypertension This is a chronic problem. The current episode started more than 1 year ago. The problem is controlled. Pertinent negatives include no blurred vision. Risk factors for coronary artery disease include diabetes mellitus, dyslipidemia and obesity. The current treatment provides moderate improvement.     Past Medical History:  Diagnosis Date  . Diabetes mellitus without complication ([place])   .  Hyperlipemia   . Hypertension      Family History  Problem Relation Age of Onset  . Diabetes Mother   . Alzheimer's disease Father   . Diabetes Sister   . Diabetes Brother      Current Outpatient Medications:  .  Alcohol Swabs (ALCOHOL PADS) 70 % PADS, Use as instructed to check blood sugars 1 time per day dx: e11.65, Disp: 150 each, Rfl: 2 .  amLODipine (NORVASC) 5 MG tablet, TAKE 1 TABLET BY MOUTH EVERY DAY, Disp: 90 tablet, Rfl: 1 .  aspirin EC 81 MG tablet, Take 81 mg by mouth daily., Disp: , Rfl:  .  Blood Glucose Calibration (TRUE METRIX LEVEL 1) Low SOLN, Use as directed dx: e11.65, Disp: 150 each, Rfl: 2 .  Blood Glucose Calibration (TRUE METRIX LEVEL 2) Normal SOLN, Use as directed dx: e11.65, Disp: 1 each, Rfl: 2 .  Blood Glucose Calibration (TRUE METRIX LEVEL 3) High SOLN, Use as directed dx: e11.65, Disp: 1 each, Rfl: 2 .  Blood Glucose Monitoring Suppl (TRUE METRIX METER) w/Device KIT, USE AS DIRECTED, Disp: 1 kit, Rfl: 1 .  FARXIGA 10 MG TABS tablet, TAKE 1 TABLET BY MOUTH EVERY DAY IN THE MORNING, Disp: 90 tablet, Rfl: 1 .  OZEMPIC, 1 MG/DOSE, 4 MG/3ML SOPN, INJECT 1 MG INTO THE SKIN ONCE A WEEK., Disp: 3 mL, Rfl: 2 .  TRUEplus Lancets 28G MISC, USE AS INSTRUCTED TO CHECK BLOOD SUGARS 1 TIME PER DAY, Disp: 100 each, Rfl: 3 .  Zoster Vaccine Adjuvanted [facility]) injection, Inject 0.5 mLs into the muscle once for 1 dose., Disp: 0.5 mL, Rfl: 0 .  lisinopril-hydrochlorothiazide (ZESTORETIC) 20-12.5 MG tablet, Take 1 tablet  by mouth daily., Disp: 90 tablet, Rfl: 2 .  simvastatin (ZOCOR) 40 MG tablet, Take 1 tablet (40 mg total) by mouth daily., Disp: 90 tablet, Rfl: 1   No Known Allergies   Review of Systems  Constitutional: Negative.   Eyes: Negative for blurred vision.  Respiratory: Negative.   Cardiovascular: Negative.   Gastrointestinal: Negative.   Musculoskeletal: Positive for arthralgias.       She c/o left heel pain. Has pain when getting out of bed. Plans to  schedule f/u with podiatry.   Psychiatric/Behavioral: Negative.   All other systems reviewed and are negative.    Today's Vitals   02/07/21 0842  BP: 114/66  Pulse: 78  Temp: 98 F (36.7 C)  TempSrc: Oral  Weight: 187 lb 3.2 oz (84.9 kg)  Height: 5' 0.4" (1.534 m)  PainSc: 0-No pain   Body mass index is 36.08 kg/m.  Wt Readings from Last 3 Encounters:  02/07/21 187 lb 3.2 oz (84.9 kg)  10/04/20 189 lb 6.4 oz (85.9 kg)  05/31/20 191 lb (86.6 kg)   BP Readings from Last 3 Encounters:  02/07/21 114/66  10/04/20 116/80  05/31/20 128/72   Objective:  Physical Exam Vitals and nursing note reviewed.  Constitutional:      Appearance: Normal appearance.  HENT:     Head: Normocephalic and atraumatic.     Nose:     Comments: Masked     Mouth/Throat:     Comments: Masked  Cardiovascular:     Rate and Rhythm: Normal rate and regular rhythm.     Heart sounds: Normal heart sounds.  Pulmonary:     Effort: Pulmonary effort is normal.     Breath sounds: Normal breath sounds.  Musculoskeletal:     Cervical back: Normal range of motion.  Skin:    General: Skin is warm.  Neurological:     General: No focal deficit present.     Mental Status: She is alert.  Psychiatric:        Mood and Affect: Mood normal.        Behavior: Behavior normal.         Assessment And Plan:     1. Type 2 diabetes mellitus with stage 2 chronic kidney disease, without long-term current use of insulin (HCC) Comments: Chronic, I will check labs as listed below. Advised to aim for at least 150 minutes of exercise per week.  She will f/u in 4 months for re-evaluation.  - BMP8+EGFR - Hemoglobin A1c  2. Hypertensive nephropathy Comments: Chronic, well controlled. ADvised to follow low sodium diet. I will not make any medication changes today.   3. Plantar fasciitis, bilateral Comments: Encouraged to stretch calves after walking.   4. Class 2 severe obesity due to excess calories with serious  comorbidity and body mass index (BMI) of 36.0 to 36.9 in adult [facility]) Comments: She is encouraged to aim for BMI less than 30 to decrease cardiac risk.   5. Immunization due Comments: She is encouraged to get second COVID booster. A month later, she can start her Shingles series.   Patient was given opportunity to ask questions. Patient verbalized understanding of the plan and was able to repeat key elements of the plan. All questions were answered to their satisfaction.   I, [name], MD, have reviewed all documentation for this visit. The documentation on 02/07/21 for the exam, diagnosis, procedures, and orders are all accurate and complete.   IF YOU HAVE BEEN REFERRED TO  A SPECIALIST, IT MAY TAKE 1-2 WEEKS TO SCHEDULE/PROCESS THE REFERRAL. IF YOU HAVE NOT HEARD FROM US/SPECIALIST IN TWO WEEKS, PLEASE GIVE US A CALL AT [phone] X [phone].   THE PATIENT IS ENCOURAGED TO PRACTICE SOCIAL DISTANCING DUE TO THE COVID-19 PANDEMIC.   

## 2021-02-07 NOTE — Patient Instructions (Signed)
Diabetes Mellitus and Exercise Exercising regularly is important for overall health, especially for people who have diabetes mellitus. Exercising is not only about losing weight. It has many other health benefits, such as increasing muscle strength and bone density and reducing body fat and stress. This leads to improved fitness, flexibility, and endurance, all of which result in better overall health. What are the benefits of exercise if I have diabetes? Exercise has many benefits for people with diabetes. They include:  Helping to lower and control blood sugar (glucose).  Helping the body to respond better to the hormone insulin by improving insulin sensitivity.  Reducing how much insulin the body needs.  Lowering the risk for heart disease by: ? Lowering "bad" cholesterol and triglyceride levels. ? Increasing "good" cholesterol levels. ? Lowering blood pressure. ? Lowering blood glucose levels. What is my activity plan? Your health care provider or certified diabetes educator can help you make a plan for the type and frequency of exercise that works for you. This is called your activity plan. Be sure to:  Get at least 150 minutes of medium-intensity or high-intensity exercise each week. Exercises may include brisk walking, biking, or water aerobics.  Do stretching and strengthening exercises, such as yoga or weight lifting, at least 2 times a week.  Spread out your activity over at least 3 days of the week.  Get some form of physical activity each day. ? Do not go more than 2 days in a row without some kind of physical activity. ? Avoid being inactive for more than 90 minutes at a time. Take frequent breaks to walk or stretch.  Choose exercises or activities that you enjoy. Set realistic goals.  Start slowly and gradually increase your exercise intensity over time.   How do I manage my diabetes during exercise? Monitor your blood glucose  Check your blood glucose before and  after exercising. If your blood glucose is: ? 240 mg/dL (13.3 mmol/L) or higher before you exercise, check your urine for ketones. These are chemicals created by the liver. If you have ketones in your urine, do not exercise until your blood glucose returns to normal. ? 100 mg/dL (5.6 mmol/L) or lower, eat a snack containing 15-20 grams of carbohydrate. Check your blood glucose 15 minutes after the snack to make sure that your glucose level is above 100 mg/dL (5.6 mmol/L) before you start your exercise.  Know the symptoms of low blood glucose (hypoglycemia) and how to treat it. Your risk for hypoglycemia increases during and after exercise. Follow these tips and your health care provider's instructions  Keep a carbohydrate snack that is fast-acting for use before, during, and after exercise to help prevent or treat hypoglycemia.  Avoid injecting insulin into areas of the body that are going to be exercised. For example, avoid injecting insulin into: ? Your arms, when you are about to play tennis. ? Your legs, when you are about to go jogging.  Keep records of your exercise habits. Doing this can help you and your health care provider adjust your diabetes management plan as needed. Write down: ? Food that you eat before and after you exercise. ? Blood glucose levels before and after you exercise. ? The type and amount of exercise you have done.  Work with your health care provider when you start a new exercise or activity. He or she may need to: ? Make sure that the activity is safe for you. ? Adjust your insulin, other medicines, and food that   you eat.  Drink plenty of water while you exercise. This prevents loss of water (dehydration) and problems caused by a lot of heat in the body (heat stroke).   Where to find more information  American Diabetes Association: www.diabetes.org Summary  Exercising regularly is important for overall health, especially for people who have diabetes  mellitus.  Exercising has many health benefits. It increases muscle strength and bone density and reduces body fat and stress. It also lowers and controls blood glucose.  Your health care provider or certified diabetes educator can help you make an activity plan for the type and frequency of exercise that works for you.  Work with your health care provider to make sure any new activity is safe for you. Also work with your health care provider to adjust your insulin, other medicines, and the food you eat. This information is not intended to replace advice given to you by your health care provider. Make sure you discuss any questions you have with your health care provider. Document Revised: 06/08/2019 Document Reviewed: 06/08/2019 Elsevier Patient Education  2021 Elsevier Inc.  

## 2021-02-10 ENCOUNTER — Encounter: Payer: Self-pay | Admitting: Internal Medicine

## 2021-02-13 ENCOUNTER — Ambulatory Visit (INDEPENDENT_AMBULATORY_CARE_PROVIDER_SITE_OTHER): Payer: Medicare HMO | Admitting: Nurse Practitioner

## 2021-02-13 ENCOUNTER — Encounter: Payer: Self-pay | Admitting: Nurse Practitioner

## 2021-02-13 ENCOUNTER — Other Ambulatory Visit: Payer: Self-pay

## 2021-02-13 VITALS — BP 132/80 | HR 67 | Temp 97.5°F | Ht 60.6 in | Wt 187.4 lb

## 2021-02-13 DIAGNOSIS — R21 Rash and other nonspecific skin eruption: Secondary | ICD-10-CM | POA: Diagnosis not present

## 2021-02-13 MED ORDER — VALACYCLOVIR HCL 500 MG PO TABS: 500.0000 mg | ORAL_TABLET | Freq: Two times a day (BID) | ORAL | 0 refills | Status: AC

## 2021-02-13 MED ORDER — OZEMPIC (2 MG/DOSE) 8 MG/3ML ~~LOC~~ SOPN: 2.0000 mg | PEN_INJECTOR | SUBCUTANEOUS | 1 refills | Status: DC

## 2021-02-13 NOTE — Progress Notes (Signed)
I,Yamilka Roman [employer] as a Education administrator for [employer], FNP.,have documented all relevant documentation on the behalf of Minette Brine, FNP,as directed by  Minette Brine, FNP while in the presence of Minette Brine, [place]. This visit occurred during the SARS-CoV-2 public health emergency.  Safety protocols were in place, including screening questions prior to the visit, additional usage of staff PPE, and extensive cleaning of exam room while observing appropriate contact time as indicated for disinfecting solutions.  Subjective:     Patient ID: Monica Ryan , female    DOB: 12-09-1955 , 65 y.o.   MRN: [id number]   Chief Complaint  Patient presents with   Rash    HPI  Reports the rash on right side occurred on Wednesday after her visit. She reports having pain She was supposed to have her 2nd covid booster but has it on hold until we find out what this rash is  She had burning and itching at the initiation of the rash ,she has been using calamine lotion.   Rash This is a recurrent problem. The current episode started in the past 7 days. The problem has been gradually worsening since onset. The affected locations include the back. The rash is characterized by itchiness and burning. It is unknown if there was an exposure to a precipitant. Pertinent negatives include no fever. Past treatments include anti-itch cream. The treatment provided mild relief.    Past Medical History:  Diagnosis Date   Diabetes mellitus without complication ([place])    Hyperlipemia    Hypertension      Family History  Problem Relation Age of Onset   Diabetes Mother    Alzheimer's disease Father    Diabetes Sister    Diabetes Brother      Current Outpatient Medications:    Alcohol Swabs (ALCOHOL PADS) 70 % PADS, Use as instructed to check blood sugars 1 time per day dx: e11.65, Disp: 150 each, Rfl: 2   aspirin EC 81 MG tablet, Take 81 mg by mouth daily., Disp: , Rfl:    Blood Glucose Calibration (TRUE  METRIX LEVEL 1) Low SOLN, Use as directed dx: e11.65, Disp: 150 each, Rfl: 2   Blood Glucose Calibration (TRUE METRIX LEVEL 2) Normal SOLN, Use as directed dx: e11.65, Disp: 1 each, Rfl: 2   Blood Glucose Calibration (TRUE METRIX LEVEL 3) High SOLN, Use as directed dx: e11.65, Disp: 1 each, Rfl: 2   Blood Glucose Monitoring Suppl (TRUE METRIX METER) w/Device KIT, USE AS DIRECTED, Disp: 1 kit, Rfl: 1   FARXIGA 10 MG TABS tablet, TAKE 1 TABLET BY MOUTH EVERY DAY IN THE MORNING, Disp: 90 tablet, Rfl: 1   lisinopril-hydrochlorothiazide (ZESTORETIC) 20-12.5 MG tablet, Take 1 tablet by mouth daily., Disp: 90 tablet, Rfl: 2   Semaglutide, 2 MG/DOSE, (OZEMPIC, 2 MG/DOSE,) 8 MG/3ML SOPN, Inject 2 mg into the skin once a week., Disp: 9 mL, Rfl: 1   simvastatin (ZOCOR) 40 MG tablet, Take 1 tablet (40 mg total) by mouth daily., Disp: 90 tablet, Rfl: 1   TRUEplus Lancets 28G MISC, USE AS INSTRUCTED TO CHECK BLOOD SUGARS 1 TIME PER DAY, Disp: 100 each, Rfl: 3   amLODipine (NORVASC) 5 MG tablet, TAKE 1 TABLET BY MOUTH EVERY DAY, Disp: 90 tablet, Rfl: 1   No Known Allergies   Review of Systems  Constitutional:  Negative for fever.  Respiratory: Negative.    Cardiovascular: Negative.   Skin:  Positive for rash (right side of back).  Neurological:  Negative for  dizziness and headaches.    Today's Vitals   02/13/21 1034  BP: 132/80  Pulse: 67  Temp: (!) 97.5 F (36.4 C)  Weight: 187 lb 6.4 oz (85 kg)  Height: 5' 0.6" (1.539 m)  PainSc: 0-No pain   Body mass index is 35.88 kg/m.   Objective:  Physical Exam Vitals reviewed.  Constitutional:      General: She is not in acute distress.    Appearance: Normal appearance.  Cardiovascular:     Pulses: Normal pulses.     Heart sounds: Normal heart sounds. No murmur heard. Pulmonary:     Effort: Pulmonary effort is normal. No respiratory distress.     Breath sounds: Normal breath sounds. No wheezing.  Skin:    Capillary Refill: Capillary refill  takes less than 2 seconds.     Findings: Rash (right upper back with clustered rash) present.  Neurological:     General: No focal deficit present.     Mental Status: She is alert and oriented to person, place, and time.     Cranial Nerves: No cranial nerve deficit.  Psychiatric:        Mood and Affect: Mood normal.        Behavior: Behavior normal.        Thought Content: Thought content normal.        Judgment: Judgment normal.        Assessment And Plan:     1. Rash and nonspecific skin eruption Will check to see if she has an active zoster exacerbation and will treat with valtrex to treat shingles. Advised to wait until we are sure if this is shingles before getting her vaccine - Varicella zoster antibody, IgM - valACYclovir (VALTREX) 500 MG tablet; Take 1 tablet (500 mg total) by mouth 2 (two) times daily for 5 days.  Dispense: 10 tablet; Refill: 0    Patient was given opportunity to ask questions. Patient verbalized understanding of the plan and was able to repeat key elements of the plan. All questions were answered to their satisfaction.  Minette Brine, FNP   I, Minette Brine, FNP, have reviewed all documentation for this visit. The documentation on 02/13/21 for the exam, diagnosis, procedures, and orders are all accurate and complete.   IF YOU HAVE BEEN REFERRED TO A SPECIALIST, IT MAY TAKE 1-2 WEEKS TO SCHEDULE/PROCESS THE REFERRAL. IF YOU HAVE NOT HEARD FROM US/SPECIALIST IN TWO WEEKS, PLEASE GIVE [place] A CALL AT [phone].   THE PATIENT IS ENCOURAGED TO PRACTICE SOCIAL DISTANCING DUE TO THE COVID-19 PANDEMIC.

## 2021-02-14 ENCOUNTER — Encounter: Payer: Self-pay | Admitting: Nurse Practitioner

## 2021-02-14 LAB — VARICELLA ZOSTER ANTIBODY, IGM: Varicella IgM: <0.91 index (ref 0.00–0.90)

## 2021-03-06 ENCOUNTER — Other Ambulatory Visit: Payer: Self-pay | Admitting: Internal Medicine

## 2021-03-10 ENCOUNTER — Other Ambulatory Visit: Payer: Self-pay | Admitting: Internal Medicine

## 2021-03-10 DIAGNOSIS — Z1231 Encounter for screening mammogram for malignant neoplasm of breast: Secondary | ICD-10-CM

## 2021-03-14 ENCOUNTER — Encounter: Payer: Self-pay | Admitting: Nurse Practitioner

## 2021-03-24 DIAGNOSIS — Z20822 Contact with and (suspected) exposure to covid-19: Secondary | ICD-10-CM | POA: Diagnosis not present

## 2021-04-11 ENCOUNTER — Other Ambulatory Visit: Payer: Self-pay | Admitting: Internal Medicine

## 2021-04-12 ENCOUNTER — Telehealth: Payer: Self-pay

## 2021-04-12 NOTE — Chronic Care Management (AMB) (Signed)
Chronic Care Management Pharmacy Assistant   Name: Monica Ryan  MRN: [id number] DOB: Aug 21, 1956   Reason for Encounter: Disease State/ General    Recent office visits:  02-07-2021 Monica Chard, MD. Shingrix injection given. BMP8+EGFR Glucose= 110. A1C= 6.8.  02-13-2021 Monica Brine, FNP. START Valtrex 500 mg twice daily for 5 days. Increase Ozempic 1 mg weekly to 2 mg weekly.  Recent consult visits:  None  Hospital visits:  None in previous 6 months  Medications: Outpatient Encounter Medications as of 04/12/2021  Medication Sig   ACCU-CHEK SMARTVIEW test strip TEST ONCE DAILY   Alcohol Swabs (ALCOHOL PADS) 70 % PADS Use as instructed to check blood sugars 1 time per day dx: e11.65   amLODipine (NORVASC) 5 MG tablet TAKE 1 TABLET BY MOUTH EVERY DAY   aspirin EC 81 MG tablet Take 81 mg by mouth daily.   Blood Glucose Calibration (TRUE METRIX LEVEL 1) Low SOLN Use as directed dx: e11.65   Blood Glucose Calibration (TRUE METRIX LEVEL 2) Normal SOLN Use as directed dx: e11.65   Blood Glucose Calibration (TRUE METRIX LEVEL 3) High SOLN Use as directed dx: e11.65   Blood Glucose Monitoring Suppl (TRUE METRIX METER) w/Device KIT USE AS DIRECTED   FARXIGA 10 MG TABS tablet TAKE 1 TABLET BY MOUTH EVERY DAY IN THE MORNING   lisinopril-hydrochlorothiazide (ZESTORETIC) 20-12.5 MG tablet Take 1 tablet by mouth daily.   Semaglutide, 2 MG/DOSE, (OZEMPIC, 2 MG/DOSE,) 8 MG/3ML SOPN Inject 2 mg into the skin once a week.   simvastatin (ZOCOR) 40 MG tablet Take 1 tablet (40 mg total) by mouth daily.   TRUEplus Lancets 28G MISC USE AS INSTRUCTED TO CHECK BLOOD SUGARS 1 TIME PER DAY   No facility-administered encounter medications on file as of 04/12/2021.   Reviewed chart prior to disease state call. Spoke with patient regarding BP  Recent Office Vitals: BP Readings from Last 3 Encounters:  02/13/21 132/80  02/07/21 114/66  10/04/20 116/80   Pulse Readings from Last 3 Encounters:   02/13/21 67  02/07/21 78  10/04/20 85    Wt Readings from Last 3 Encounters:  02/13/21 187 lb 6.4 oz (85 kg)  02/07/21 187 lb 3.2 oz (84.9 kg)  10/04/20 189 lb 6.4 oz (85.9 kg)     Kidney Function Lab Results  Component Value Date/Time   CREATININE 0.97 02/07/2021 09:06 AM   CREATININE 0.94 10/04/2020 02:20 PM   GFRNONAA 64 10/04/2020 02:20 PM   GFRAA 74 10/04/2020 02:20 PM    BMP Latest Ref Rng & Units 02/07/2021 10/04/2020 05/09/2020  Glucose 65 - 99 mg/dL 110(H) 79 111(H)  BUN 8 - 27 mg/dL _0 Creatinine 0.57 - 1.00 mg/dL 0.97 0.94 0.98  BUN/Creat Ratio 12 - _1 Sodium 134 - 144 mmol/L 140 139 141  Potassium 3.5 - 5.2 mmol/L 4.1 3.6 4.1  Chloride 96 - 106 mmol/L 100 99 103  CO2 20 - 29 mmol/L _2 Calcium 8.7 - 10.3 mg/dL 9.4 9.5 9.5    Current antihypertensive regimen:  Lisinopril-HCTZ 20-12.5 mg daily Amlodipine 5 mg daily  How often are you checking your Blood Pressure? weekly  Current home BP readings: Patient states she doesn't remember last reading but it was normal.  What recent interventions/DTPs have been made by any provider to improve Blood Pressure control since last CPP Visit:  Patient states she takes medications as directed.  Any recent hospitalizations or ED visits  since last visit with CPP? No  What diet changes have been made to improve Blood Pressure Control?  Patient states she limits salt intake, has decreased fried food intake and doesn't eat out as much  What exercise is being done to improve your Blood Pressure Control?  Patient states she works in her yard and walks some days  Adherence Review: Is the patient currently on ACE/ARB medication? Yes Does the patient have >5 day gap between last estimated fill dates? No  Recent Relevant Labs: Lab Results  Component Value Date/Time   HGBA1C 6.8 (H) 02/07/2021 09:06 AM   HGBA1C 6.9 (H) 10/04/2020 02:20 PM   MICROALBUR 10 10/04/2020 12:29 PM   MICROALBUR 10 10/05/2019  10:01 AM    Kidney Function Lab Results  Component Value Date/Time   CREATININE 0.97 02/07/2021 09:06 AM   CREATININE 0.94 10/04/2020 02:20 PM   UGQBVQXI 50 10/04/2020 02:20 PM   GFRAA 74 10/04/2020 02:20 PM    Current antihyperglycemic regimen:  Ozempic 1 mg weekly Farxiga 10 mg daily  What recent interventions/DTPs have been made to improve glycemic control:  Patient stated she is taking medications as directed and checking blood sugars daily.  Have there been any recent hospitalizations or ED visits since last visit with CPP? No  Patient denies hypoglycemic symptoms  Patient denies hyperglycemic symptoms  How often are you checking your blood sugar? once daily  What are your blood sugars ranging?  Fasting: 119-124 Before meals: None After meals: None Bedtime: None  During the week, how often does your blood glucose drop below 70? Never  Are you checking your feet daily/regularly? Patient states daily  Adherence Review: Is the patient currently on a STATIN medication? Yes Is the patient currently on ACE/ARB medication? Yes Does the patient have >5 day gap between last estimated fill dates? No  NOTES: Patient states she is doing well. Made patient a telephone appointment with Monica Ryan CPP in September  Care Gaps: PNA vac overdue Medicare wellness 10-11-2021  Star Rating Drugs: Simvastatin 40 mg- Last filled 02-07-2021 [facility] Ozempic 2 mg- Last filled 02-13-2021  Lisinopril-HCTZ 20-12.5 mg- Last filled 02-07-2021 [facility] Farxiga 10 mg- Last filled 12-12-2020 [facility]  Monica Ryan Clinical Pharmacist Assistant [phone]

## 2021-04-26 ENCOUNTER — Other Ambulatory Visit: Payer: Self-pay | Admitting: Internal Medicine

## 2021-04-27 ENCOUNTER — Encounter: Payer: Self-pay | Admitting: Internal Medicine

## 2021-04-27 ENCOUNTER — Other Ambulatory Visit: Payer: Self-pay

## 2021-04-27 MED ORDER — OZEMPIC (2 MG/DOSE) 8 MG/3ML ~~LOC~~ SOPN: 2.0000 mg | PEN_INJECTOR | SUBCUTANEOUS | 1 refills | Status: DC

## 2021-05-04 ENCOUNTER — Other Ambulatory Visit: Payer: Self-pay

## 2021-05-04 ENCOUNTER — Ambulatory Visit
Admission: RE | Admit: 2021-05-04 | Discharge: 2021-05-04 | Disposition: A | Discharge status: Home / Self Care | Payer: Medicare HMO | Source: Ambulatory Visit | Attending: Internal Medicine | Admitting: Internal Medicine

## 2021-05-04 DIAGNOSIS — Z1231 Encounter for screening mammogram for malignant neoplasm of breast: Secondary | ICD-10-CM | POA: Diagnosis not present

## 2021-06-06 ENCOUNTER — Telehealth: Payer: Self-pay

## 2021-06-06 NOTE — Chronic Care Management (AMB) (Signed)
  Monica Ryan was reminded to have all medications, supplements and any blood glucose and blood pressure readings available for review with Monica Ryan, Pharm. D, at her telephone visit on 06-07-2021 at 10:00.   Questions: Have you had any recent office visit or specialist visit outside of [facility] systems? Patient stated no  Are there any concerns you would like to discuss during your office visit? Patient stated no  Are you having any problems obtaining your medications? (Whether it pharmacy issues or cost) Patient stated no   If patient has any PAP medications ask if they are having any problems getting their PAP medication or refill? No PAP medications  Care Gaps: PNA vac overdue Medicare wellness 10-11-2021  Star Rating Drug: Simvastatin 40 mg- Last filled 04-26-2021 [facility] Ozempic 2 mg- Last filled 04-27-2021 [facility] Lisinopril-HCTZ 20-12.5 mg- Last filled 04-26-2021 [facility] Farxiga 10 mg- Last filled 12-12-2020 [facility] (Patient states she has plenty)  Any gaps in medications fill history? No  Huey Romans [facility] Clinical Pharmacist Assistant [phone]

## 2021-06-07 ENCOUNTER — Ambulatory Visit (INDEPENDENT_AMBULATORY_CARE_PROVIDER_SITE_OTHER): Payer: Medicare HMO

## 2021-06-07 DIAGNOSIS — E782 Mixed hyperlipidemia: Secondary | ICD-10-CM

## 2021-06-07 DIAGNOSIS — E1122 Type 2 diabetes mellitus with diabetic chronic kidney disease: Secondary | ICD-10-CM

## 2021-06-07 NOTE — Progress Notes (Signed)
Chronic Care Management Pharmacy Note  06/15/2021 Name:  Monica Ryan MRN:  [id number] DOB:  [birth year]  Summary: Patient would like to know more about vaccinations.   Recommendations/Changes made from today's visit: Recommend patient begins to exercise more often.  Recommend patients simvastatin be changed to atorvastatin.   Plan: Patient to continue to keep up with vaccines.  Increasing execise to 5 days per week.  Patients simvastatin to be changed to atorvastatin 20 mg tablet daily.   Subjective: Monica Ryan is an 65 y.o. year old female who is a primary patient of [name], MD.  The CCM team was consulted for assistance with disease management and care coordination needs.    Engaged with patient by telephone for follow up visit in response to provider referral for pharmacy case management and/or care coordination services.   Consent to Services:  The patient was given information about Chronic Care Management services, agreed to services, and gave verbal consent prior to initiation of services.  Please see initial visit note for detailed documentation.   Patient Care Team: [name], MD as PCP - General (Internal Medicine)  Recent office visits: 02/07/2021 PCP [facility] visits: None in previous 6 months   Objective:  Lab Results  Component Value Date   CREATININE 1.04 (H) 06/12/2021   BUN 17 06/12/2021   GFRNONAA 64 10/04/2020   GFRAA 74 10/04/2020   NA 139 06/12/2021   K 3.8 06/12/2021   CALCIUM 9.5 06/12/2021   CO2 24 06/12/2021   GLUCOSE 92 06/12/2021    Lab Results  Component Value Date/Time   HGBA1C 6.6 (H) 06/12/2021 10:28 AM   HGBA1C 6.8 (H) 02/07/2021 09:06 AM   MICROALBUR 10 10/04/2020 12:29 PM   MICROALBUR 10 10/05/2019 10:01 AM    Last diabetic Eye exam:  Lab Results  Component Value Date/Time   HMDIABEYEEXA No Retinopathy 09/19/2020 12:00 AM    Last diabetic Foot exam: No results found for: HMDIABFOOTEX   Lab  Results  Component Value Date   CHOL 181 10/04/2020   HDL 79 10/04/2020   LDLCALC 83 10/04/2020   TRIG 108 10/04/2020   CHOLHDL 2.3 10/04/2020    Hepatic Function Latest Ref Rng & Units 06/12/2021 10/04/2020 10/05/2019  Total Protein 6.0 - 8.5 g/dL 7.5 7.5 7.6  Albumin 3.8 - 4.8 g/dL 4.6 4.4 4.6  AST 0 - 40 IU/L $Remov'19 19 18  'XzYaeF$ ALT 0 - 32 IU/L $Remov'22 13 16  'tjpJkz$ Alk Phosphatase 44 - 121 IU/L 126(H) 121 136(H)  Total Bilirubin 0.0 - 1.2 mg/dL 0.4 0.4 0.4    Lab Results  Component Value Date/Time   TSH 1.210 10/04/2020 02:20 PM    CBC Latest Ref Rng & Units 10/04/2020 10/05/2019 09/30/2018  WBC 3.4 - 10.8 x10E3/uL 7.3 8.5 8.3  Hemoglobin 11.1 - 15.9 g/dL 13.8 14.4 13.6  Hematocrit 34.0 - 46.6 % 42.5 42.4 41.0  Platelets 150 - 450 x10E3/uL 286 328 314    Lab Results  Component Value Date/Time   VD25OH 39.9 10/04/2020 02:20 PM    Clinical ASCVD: No  The 10-year ASCVD risk score (Arnett DK, et al., 2019) is: 16.4%   Values used to calculate the score:     Age: 75 years     Sex: Female     Is Non-Hispanic African American: Yes     Diabetic: Yes     Tobacco smoker: No     Systolic Blood Pressure: 497 mmHg  Is BP treated: Yes     HDL Cholesterol: 79 mg/dL     Total Cholesterol: 181 mg/dL    Depression screen [facility] 2/9 10/04/2020 07/15/2019 04/14/2019  Decreased Interest 0 0 0  Down, Depressed, Hopeless 0 0 0  PHQ - 2 Score 0 0 0      Social History   Tobacco Use  Smoking Status Never  Smokeless Tobacco Never  Tobacco Comments   n/a   BP Readings from Last 3 Encounters:  06/12/21 120/66  02/13/21 132/80  02/07/21 114/66   Pulse Readings from Last 3 Encounters:  06/12/21 78  02/13/21 67  02/07/21 78   Wt Readings from Last 3 Encounters:  06/12/21 185 lb 12.8 oz (84.3 kg)  02/13/21 187 lb 6.4 oz (85 kg)  02/07/21 187 lb 3.2 oz (84.9 kg)   BMI Readings from Last 3 Encounters:  06/12/21 35.57 kg/m  02/13/21 35.88 kg/m  02/07/21 36.08 kg/m     Assessment/Interventions: Review of patient past medical history, allergies, medications, health status, including review of consultants reports, laboratory and other test data, was performed as part of comprehensive evaluation and provision of chronic care management services.   SDOH:  (Social Determinants of Health) assessments and interventions performed: Yes  SDOH Screenings   Alcohol Screen: Low Risk    Last Alcohol Screening Score (AUDIT): 1  Depression (PHQ2-9): Low Risk    PHQ-2 Score: 0  Financial Resource Strain: Not on file  Food Insecurity: No Food Insecurity   Worried About Charity fundraiser in the Last Year: Never true   Ran Out of Food in the Last Year: Never true  Housing: Low Risk    Last Housing Risk Score: 0  Physical Activity: Insufficiently Active   Days of Exercise per Week: 4 days   Minutes of Exercise per Session: 30 min  Social Connections: Moderately Integrated   Frequency of Communication with Friends and Family: More than three times a week   Frequency of Social Gatherings with Friends and Family: Twice a week   Attends Religious Services: More than 4 times per year   Active Member of [employer] or Organizations: Yes   Attends Music therapist: More than 4 times per year   Marital Status: Divorced  Stress: No Stress Concern Present   Feeling of Stress : Not at all  Tobacco Use: Low Risk    Smoking Tobacco Use: Never   Smokeless Tobacco Use: Never  Transportation Needs: No Transportation Needs   Lack of Transportation (Medical): No   Lack of Transportation (Non-Medical): No    CCM Care Plan  No Known Allergies  Medications Reviewed Today     Reviewed by [name], MD (Physician) on 06/12/21 at 1010  Med List Status: <None>   Medication Order Taking? Sig Documenting Provider Last Dose Status Informant  ACCU-CHEK SMARTVIEW test strip [id number] Yes TEST ONCE DAILY [name], MD Taking Active   Alcohol Swabs (ALCOHOL PADS)  70 % PADS [id number] Yes Use as instructed to check blood sugars 1 time per day dx: e11.65 [name], MD Taking Active   amLODipine (NORVASC) 5 MG tablet [id number] Yes TAKE 1 TABLET BY MOUTH EVERY DAY [name], MD Taking Active   aspirin EC 81 MG tablet [id number] Yes Take 81 mg by mouth daily. [provider] Taking Active   Blood Glucose Calibration (TRUE METRIX LEVEL 1) Low SOLN [id number] Yes Use as directed dx: e11.65 [name], MD Taking Active   Blood Glucose Calibration (  TRUE METRIX LEVEL 2) Normal SOLN [id number] Yes Use as directed dx: e11.65 [name], MD Taking Active   Blood Glucose Calibration (TRUE METRIX LEVEL 3) High SOLN [id number] Yes Use as directed dx: e11.65 [name], MD Taking Active   Blood Glucose Monitoring Suppl (TRUE METRIX METER) w/Device KIT [id number] Yes USE AS DIRECTED [name], MD Taking Active   FARXIGA 10 MG TABS tablet [id number] Yes TAKE 1 TABLET BY MOUTH EVERY DAY IN THE Wende Neighbors, MD Taking Active   lisinopril-hydrochlorothiazide (ZESTORETIC) 20-12.5 MG tablet [id number] Yes Take 1 tablet by mouth daily. [name], MD Taking Active   Semaglutide, 2 MG/DOSE, (OZEMPIC, 2 MG/DOSE,) 8 MG/3ML SOPN [id number] Yes Inject 2 mg into the skin once a week. Minette Brine, FNP Taking Active   simvastatin (ZOCOR) 40 MG tablet [id number] Yes Take 1 tablet (40 mg total) by mouth daily. [name], MD Taking Active   TRUEplus Lancets 28G Ray [id number] Yes USE AS INSTRUCTED TO CHECK BLOOD SUGARS 1 TIME PER DAY [name], MD Taking Active   Med List Note Levin Erp 07/24/12 2637): Kombiglyze 01/999 daily 4pm(diabetes)            Patient Active Problem List   Diagnosis Date Noted   Type 2 diabetes mellitus with stage 2 chronic kidney disease, without long-term current use of insulin ([place]) 09/30/2018   Chronic renal disease, stage II 09/30/2018   Hypertensive nephropathy 09/30/2018   Left foot pain  09/30/2018    Immunization History  Administered Date(s) Administered   DTaP 02/06/2013   Fluad Quad(high Dose 65+) 10/04/2020   Influenza, High Dose Seasonal PF 06/07/2021   Influenza,inj,Quad PF,6+ Mos 07/15/2019   Influenza-Unspecified 06/17/2018   Moderna SARS-COV2 Booster Vaccination 07/26/2020   Moderna Sars-Covid-2 Vaccination 10/17/2019, 11/21/2019, 03/13/2021   PNEUMOCOCCAL CONJUGATE-20 06/07/2021   Pneumococcal Conjugate-13 01/16/2020   Pneumococcal-Unspecified 04/20/2014   Td 07/10/2006   Zoster Recombinat (Shingrix) 04/23/2021    Conditions to be addressed/monitored:  Hypertension and Diabetes  Care Plan : [place]  Updates made by Mayford Knife, [place] since 06/15/2021 12:00 AM     Problem: HLD, DM II      Goal: Disease Management   Note:   Current Barriers:  Unable to independently monitor therapeutic efficacy  Pharmacist Clinical Goal(s):  Patient will achieve adherence to monitoring guidelines and medication adherence to achieve therapeutic efficacy through collaboration with PharmD and provider.   Interventions: 1:1 collaboration with [name], MD regarding development and update of comprehensive plan of care as evidenced by provider attestation and co-signature Inter-disciplinary care team collaboration (see longitudinal plan of care) Comprehensive medication review performed; medication list updated in electronic medical record  Hyperlipidemia: (LDL goal < 70) -Not ideally controlled -Current treatment: Simvastatin 40 mg tablet once per day  Aspirin 81 mg tablet once per day  -Current dietary patterns: patient has increased the amount of fiber that she is eating, when she cooks at home she uses a convection oven to prepare her meats  -Current exercise habits: she is walking around her house, about 30 minutes, and she also does 3000-5000 steps per day before she stops walking.  Goal for patient to walk 30 minutes five times per  week  -Educated on Cholesterol goals;  Benefits of statin for ASCVD risk reduction; Importance of limiting foods high in cholesterol; Exercise goal of 150 minutes per week; -Counseled on diet and exercise extensively Collaborated with PCP to change patients Simvastatin to Atorvastatin 20 mg  tablet once per day.   Diabetes (A1c goal <7%) -Controlled -Current medications: Semaglutide 2 mg once per week Farxiga 10 mg tablet once per dya   -Current home glucose readings fasting glucose: 135- 107  -Denies hypoglycemic/hyperglycemic symptoms -Current meal patterns:  breakfast: bacon and egg sandwich, or special k cereal , breakfast bar with water or juice lunch: does not eat  dinner: meat and vegetables  snacks: she does not eat any snacks through out the day drinks: water, with flavor packets most have zero sugar or 5 calories  -Current exercise: please see hypertension for more details  -Educated on Complications of diabetes including kidney damage, retinal damage, and cardiovascular disease; Exercise goal of 150 minutes per week; Prevention and management of hypoglycemic episodes; -Counseled to check feet daily and get yearly eye exams -Recommended to continue current medication  Health Maintenance -Vaccine gaps: Inlluenza Vaccine - Patient received today on 06/07/2021, Pneumonia Vaccine - received today on 06/07/2021 -Recommended to continue current medication  Patient Goals/Self-Care Activities Patient will:  - take medications as prescribed  Follow Up Plan: The patient has been provided with contact information for the care management team and has been advised to call with any health related questions or concerns.       Medication Assistance: None required.  Patient affirms current coverage meets needs.  Compliance/Adherence/Medication fill history: Care Gaps: None noted   Star-Rating Drugs: Farxiga 10 mg tablet once per day  Lisinopril-Hydrochlorothiazide 20-12.5 mg  tablet once per day  Ozempic   Patient's preferred pharmacy is:  [id number] [place], [place] [address] [place] [address] Phone: [phone] Fax: [phone]  [facility] [id number] [place] [address], [place] [address] Phone: [phone] Fax: [phone]  [place], [place] [address] Phone: [phone] Fax: [phone]  Uses pill box? Yes Pt endorses 90% compliance  We discussed: Current pharmacy is preferred with insurance plan and patient is satisfied with pharmacy services Patient decided to: Continue current medication management strategy  Care Plan and Follow Up Patient Decision:  Patient agrees to Care Plan and Follow-up.  Plan: The patient has been provided with contact information for the care management team and has been advised to call with any health related questions or concerns.   [name], PharmD Clinical Pharmacist [facility] [phone]

## 2021-06-12 ENCOUNTER — Ambulatory Visit (INDEPENDENT_AMBULATORY_CARE_PROVIDER_SITE_OTHER): Payer: Medicare HMO | Admitting: Internal Medicine

## 2021-06-12 ENCOUNTER — Other Ambulatory Visit: Payer: Self-pay

## 2021-06-12 ENCOUNTER — Encounter: Payer: Self-pay | Admitting: Internal Medicine

## 2021-06-12 VITALS — BP 120/66 | HR 78 | Temp 97.9°F | Ht 60.6 in | Wt 185.8 lb

## 2021-06-12 DIAGNOSIS — Z87898 Personal history of other specified conditions: Secondary | ICD-10-CM | POA: Diagnosis not present

## 2021-06-12 DIAGNOSIS — Z6835 Body mass index (BMI) 35.0-35.9, adult: Secondary | ICD-10-CM | POA: Diagnosis not present

## 2021-06-12 DIAGNOSIS — I129 Hypertensive chronic kidney disease with stage 1 through stage 4 chronic kidney disease, or unspecified chronic kidney disease: Secondary | ICD-10-CM

## 2021-06-12 DIAGNOSIS — N182 Chronic kidney disease, stage 2 (mild): Secondary | ICD-10-CM | POA: Diagnosis not present

## 2021-06-12 DIAGNOSIS — E1122 Type 2 diabetes mellitus with diabetic chronic kidney disease: Secondary | ICD-10-CM | POA: Diagnosis not present

## 2021-06-12 DIAGNOSIS — M542 Cervicalgia: Secondary | ICD-10-CM | POA: Diagnosis not present

## 2021-06-12 DIAGNOSIS — M25512 Pain in left shoulder: Secondary | ICD-10-CM

## 2021-06-12 MED ORDER — SCOPOLAMINE 1 MG/3DAYS TD PT72
1.0000 | MEDICATED_PATCH | TRANSDERMAL | 1 refills | Status: DC
Start: 2021-06-12 — End: 2021-10-30

## 2021-06-12 MED ORDER — CYCLOBENZAPRINE HCL 5 MG PO TABS: 5.0000 mg | ORAL_TABLET | Freq: Every evening | ORAL | 0 refills | Status: DC | PRN

## 2021-06-12 NOTE — Patient Instructions (Addendum)
Intermittent fasting for weight loss/diabetes  Diabetes Mellitus and Nutrition, Adult When you have diabetes, or diabetes mellitus, it is very important to have healthy eating habits because your blood sugar (glucose) levels are greatly affected by what you eat and drink. Eating healthy foods in the right amounts, at about the same times every day, can help you: Control your blood glucose. Lower your risk of heart disease. Improve your blood pressure. Reach or maintain a healthy weight. What can affect my meal plan? Every person with diabetes is different, and each person has different needs for a meal plan. Your health care provider may recommend that you work with a dietitian to make a meal plan that is best for you. Your meal plan may vary depending on factors such as: The calories you need. The medicines you take. Your weight. Your blood glucose, blood pressure, and cholesterol levels. Your activity level. Other health conditions you have, such as heart or kidney disease. How do carbohydrates affect me? Carbohydrates, also called carbs, affect your blood glucose level more than any other type of food. Eating carbs naturally raises the amount of glucose in your blood. Carb counting is a method for keeping track of how many carbs you eat. Counting carbs is important to keep your blood glucose at a healthy level, especially if you use insulin or take certain oral diabetes medicines. It is important to know how many carbs you can safely have in each meal. This is different for every person. Your dietitian can help you calculate how many carbs you should have at each meal and for each snack. How does alcohol affect me? Alcohol can cause a sudden decrease in blood glucose (hypoglycemia), especially if you use insulin or take certain oral diabetes medicines. Hypoglycemia can be a life-threatening condition. Symptoms of hypoglycemia, such as sleepiness, dizziness, and confusion, are similar to  symptoms of having too much alcohol. Do not drink alcohol if: Your health care provider tells you not to drink. You are pregnant, may be pregnant, or are planning to become pregnant. If you drink alcohol: Do not drink on an empty stomach. Limit how much you use to: 0-1 drink a day for women. 0-2 drinks a day for men. Be aware of how much alcohol is in your drink. In the [place] one drink equals one 12 oz bottle of beer (355 mL), one 5 oz glass of wine (148 mL), or one 1 oz glass of hard liquor (44 mL). Keep yourself hydrated with water, diet soda, or unsweetened iced tea. Keep in mind that regular soda, juice, and other mixers may contain a lot of sugar and must be counted as carbs. What are tips for following this plan? Reading food labels Start by checking the serving size on the "Nutrition Facts" label of packaged foods and drinks. The amount of calories, carbs, fats, and other nutrients listed on the label is based on one serving of the item. Many items contain more than one serving per package. Check the total grams (g) of carbs in one serving. You can calculate the number of servings of carbs in one serving by dividing the total carbs by 15. For example, if a food has 30 g of total carbs per serving, it would be equal to 2 servings of carbs. Check the number of grams (g) of saturated fats and trans fats in one serving. Choose foods that have a low amount or none of these fats. Check the number of milligrams (mg) of salt (sodium) in  one serving. Most people should limit total sodium intake to less than 2,300 mg per day. Always check the nutrition information of foods labeled as "low-fat" or "nonfat." These foods may be higher in added sugar or refined carbs and should be avoided. Talk to your dietitian to identify your daily goals for nutrients listed on the label. Shopping Avoid buying canned, pre-made, or processed foods. These foods tend to be high in fat, sodium, and added sugar. Shop  around the outside edge of the grocery store. This is where you will most often find fresh fruits and vegetables, bulk grains, fresh meats, and fresh dairy. Cooking Use low-heat cooking methods, such as baking, instead of high-heat cooking methods like deep frying. Cook using healthy oils, such as olive, canola, or sunflower oil. Avoid cooking with butter, cream, or high-fat meats. Meal planning Eat meals and snacks regularly, preferably at the same times every day. Avoid going long periods of time without eating. Eat foods that are high in fiber, such as fresh fruits, vegetables, beans, and whole grains. Talk with your dietitian about how many servings of carbs you can eat at each meal. Eat 4-6 oz (112-168 g) of lean protein each day, such as lean meat, chicken, fish, eggs, or tofu. One ounce (oz) of lean protein is equal to: 1 oz (28 g) of meat, chicken, or fish. 1 egg.  cup (62 g) of tofu. Eat some foods each day that contain healthy fats, such as avocado, nuts, seeds, and fish. What foods should I eat? Fruits Berries. Apples. Oranges. Peaches. Apricots. Plums. Grapes. Mango. Papaya. Pomegranate. Kiwi. Cherries. Vegetables Lettuce. Spinach. Leafy greens, including kale, chard, collard greens, and mustard greens. Beets. Cauliflower. Cabbage. Broccoli. Carrots. Green beans. Tomatoes. Peppers. Onions. Cucumbers. Brussels sprouts. Grains Whole grains, such as whole-wheat or whole-grain bread, crackers, tortillas, cereal, and pasta. Unsweetened oatmeal. Quinoa. Brown or wild rice. Meats and other proteins Seafood. Poultry without skin. Lean cuts of poultry and beef. Tofu. Nuts. Seeds. Dairy Low-fat or fat-free dairy products such as milk, yogurt, and cheese. The items listed above may not be a complete list of foods and beverages you can eat. Contact a dietitian for more information. What foods should I avoid? Fruits Fruits canned with syrup. Vegetables Canned vegetables. Frozen  vegetables with butter or cream sauce. Grains Refined white flour and flour products such as bread, pasta, snack foods, and cereals. Avoid all processed foods. Meats and other proteins Fatty cuts of meat. Poultry with skin. Breaded or fried meats. Processed meat. Avoid saturated fats. Dairy Full-fat yogurt, cheese, or milk. Beverages Sweetened drinks, such as soda or iced tea. The items listed above may not be a complete list of foods and beverages you should avoid. Contact a dietitian for more information. Questions to ask a health care provider Do I need to meet with a diabetes educator? Do I need to meet with a dietitian? What number can I call if I have questions? When are the best times to check my blood glucose? Where to find more information: American Diabetes Association: diabetes.org Academy of Nutrition and Dietetics: www.eatright.[employer] of Diabetes and Digestive and Kidney Diseases: CarFlippers.tn Association of Diabetes Care and Education Specialists: www.diabeteseducator.org Summary It is important to have healthy eating habits because your blood sugar (glucose) levels are greatly affected by what you eat and drink. A healthy meal plan will help you control your blood glucose and maintain a healthy lifestyle. Your health care provider may recommend that you work with a dietitian  to make a meal plan that is best for you. Keep in mind that carbohydrates (carbs) and alcohol have immediate effects on your blood glucose levels. It is important to count carbs and to use alcohol carefully. This information is not intended to replace advice given to you by your health care provider. Make sure you discuss any questions you have with your health care provider. Document Revised: 08/18/2019 Document Reviewed: 08/18/2019 Elsevier Patient Education  2021 [employer].

## 2021-06-12 NOTE — Progress Notes (Signed)
x I,Monica J Llittleton,acting as a scribe for Monica Greenland, MD.,have documented all relevant documentation on the behalf of Monica Greenland, MD,as directed by  Monica Greenland, MD while in the presence of Monica Greenland, MD.  This visit occurred during the SARS-CoV-2 public health emergency.  Safety protocols were in place, including screening questions prior to the visit, additional usage of staff PPE, and extensive cleaning of exam room while observing appropriate contact time as indicated for disinfecting solutions.  Subjective:     Patient ID: Monica Ryan , female    DOB: 09-Aug-1956 , 65 y.o.   MRN: [id number]   Chief Complaint  Patient presents with   Diabetes   Hypertension    HPI  She is here today for diabetes/BP check. She reports compliance with meds.   Diabetes She presents for her follow-up diabetic visit. She has type 2 diabetes mellitus. Her disease course has been stable. There are no hypoglycemic associated symptoms. Pertinent negatives for diabetes include no blurred vision. There are no hypoglycemic complications. Risk factors for coronary artery disease include diabetes mellitus, dyslipidemia, hypertension and post-menopausal. She is following a diabetic diet. She participates in exercise three times a week. Her breakfast blood glucose is taken between 7-8 am. Her breakfast blood glucose range is generally 90-110 mg/dl. An ACE inhibitor/angiotensin II receptor blocker is being taken. Eye exam is current.  Hypertension This is a chronic problem. The current episode started more than 1 year ago. The problem is controlled. Pertinent negatives include no blurred vision. Risk factors for coronary artery disease include diabetes mellitus, dyslipidemia and obesity. The current treatment provides moderate improvement.    Past Medical History:  Diagnosis Date   Diabetes mellitus without complication ([place])    Hyperlipemia    Hypertension      Family History  Problem  Relation Age of Onset   Diabetes Mother    Alzheimer's disease Father    Diabetes Sister    Diabetes Brother      Current Outpatient Medications:    ACCU-CHEK SMARTVIEW test strip, TEST ONCE DAILY, Disp: 100 strip, Rfl: 2   Alcohol Swabs (ALCOHOL PADS) 70 % PADS, Use as instructed to check blood sugars 1 time per day dx: e11.65, Disp: 150 each, Rfl: 2   amLODipine (NORVASC) 5 MG tablet, TAKE 1 TABLET BY MOUTH EVERY DAY, Disp: 90 tablet, Rfl: 1   aspirin EC 81 MG tablet, Take 81 mg by mouth daily., Disp: , Rfl:    Blood Glucose Calibration (TRUE METRIX LEVEL 1) Low SOLN, Use as directed dx: e11.65, Disp: 150 each, Rfl: 2   Blood Glucose Calibration (TRUE METRIX LEVEL 2) Normal SOLN, Use as directed dx: e11.65, Disp: 1 each, Rfl: 2   Blood Glucose Calibration (TRUE METRIX LEVEL 3) High SOLN, Use as directed dx: e11.65, Disp: 1 each, Rfl: 2   Blood Glucose Monitoring Suppl (TRUE METRIX METER) w/Device KIT, USE AS DIRECTED, Disp: 1 kit, Rfl: 1   cyclobenzaprine (FLEXERIL) 5 MG tablet, Take 1 tablet (5 mg total) by mouth at bedtime as needed for muscle spasms., Disp: 20 tablet, Rfl: 0   FARXIGA 10 MG TABS tablet, TAKE 1 TABLET BY MOUTH EVERY DAY IN THE MORNING, Disp: 90 tablet, Rfl: 1   lisinopril-hydrochlorothiazide (ZESTORETIC) 20-12.5 MG tablet, Take 1 tablet by mouth daily., Disp: 90 tablet, Rfl: 2   scopolamine (TRANSDERM-SCOP, 1.5 MG,) 1 MG/3DAYS, Place 1 patch (1.5 mg total) onto the skin every 3 (three) days., Disp: 4 patch,  Rfl: 1   Semaglutide, 2 MG/DOSE, (OZEMPIC, 2 MG/DOSE,) 8 MG/3ML SOPN, Inject 2 mg into the skin once a week., Disp: 9 mL, Rfl: 1   simvastatin (ZOCOR) 40 MG tablet, Take 1 tablet (40 mg total) by mouth daily., Disp: 90 tablet, Rfl: 1   TRUEplus Lancets 28G MISC, USE AS INSTRUCTED TO CHECK BLOOD SUGARS 1 TIME PER DAY, Disp: 100 each, Rfl: 3   No Known Allergies   Review of Systems  Constitutional: Negative.   Eyes:  Negative for blurred vision.  Respiratory:  Negative.    Cardiovascular: Negative.   Musculoskeletal:  Positive for arthralgias.       She c/o Ryan shoulder pain. Started 3 weeks ago. Denies fall/trauma. Denies weight-lifting. She is not sure what triggered it; however, admits to doing a lot of yard work. She is right handed. She denies LUE paresthesias/weakness. Has had R rotator cuff surgery in the past, does not feel the same.   Neurological: Negative.   Psychiatric/Behavioral: Negative.      Today's Vitals   06/12/21 0926  BP: 120/66  Pulse: 78  Temp: 97.9 F (36.6 C)  Weight: 185 lb 12.8 oz (84.3 kg)  Height: 5' 0.6" (1.539 m)  PainSc: 6   PainLoc: Shoulder   Body mass index is 35.57 kg/m.  Wt Readings from Last 3 Encounters:  06/12/21 185 lb 12.8 oz (84.3 kg)  02/13/21 187 lb 6.4 oz (85 kg)  02/07/21 187 lb 3.2 oz (84.9 kg)     Objective:  Physical Exam Vitals and nursing note reviewed.  Constitutional:      Appearance: Normal appearance.  HENT:     Head: Normocephalic and atraumatic.     Nose:     Comments: Masked     Mouth/Throat:     Comments: Masked  Eyes:     Extraocular Movements: Extraocular movements intact.  Cardiovascular:     Rate and Rhythm: Normal rate and regular rhythm.     Heart sounds: Normal heart sounds.  Pulmonary:     Effort: Pulmonary effort is normal.     Breath sounds: Normal breath sounds.  Musculoskeletal:        General: Swelling and tenderness present.     Cervical back: Normal range of motion. No signs of trauma or rigidity. Pain with movement and muscular tenderness present.  Skin:    General: Skin is warm.  Neurological:     General: No focal deficit present.     Mental Status: She is alert.  Psychiatric:        Mood and Affect: Mood normal.        Behavior: Behavior normal.        Assessment And Plan:     1. Type 2 diabetes mellitus with stage 2 chronic kidney disease, without long-term current use of insulin (HCC) Comments: Chronic, I will check labs as listed  below. I will adjust meds as needed.  She will rto in Feb 2023 for her next physical exam.  - Hemoglobin A1c - CMP14+EGFR  2. Hypertensive nephropathy Comments: Chronic, well controlled. She is encouraged to follow low sodium diet. No med changes today.   3. Acute pain of left shoulder Comments: Advised to apply topical pain cream twice daily. I will also refer her to [facility] for ART therapy.  - Ambulatory referral to Chiropractic  4. Cervicalgia Comments: I will send rx cyclobenzaprine to take nightly prn. I will also refer her for chiropractic therapy.  She was also given some  stretching exercises to perform.  - Ambulatory referral to Chiropractic  5. Class 2 severe obesity due to excess calories with serious comorbidity and body mass index (BMI) of 35.0 to 35.9 in adult [facility]) Comments: She is encouraged to consider intermittent fasting to help with her weight loss efforts.   6. History of motion sickness  I will send in scopolamine patches for her upcoming cruise.    Patient was given opportunity to ask questions. Patient verbalized understanding of the plan and was able to repeat key elements of the plan. All questions were answered to their satisfaction.   I, Monica Greenland, MD, have reviewed all documentation for this visit. The documentation on 06/12/21 for the exam, diagnosis, procedures, and orders are all accurate and complete.   IF YOU HAVE BEEN REFERRED TO A SPECIALIST, IT MAY TAKE 1-2 WEEKS TO SCHEDULE/PROCESS THE REFERRAL. IF YOU HAVE NOT HEARD FROM US/SPECIALIST IN TWO WEEKS, PLEASE GIVE [place] A CALL AT [phone].   THE PATIENT IS ENCOURAGED TO PRACTICE SOCIAL DISTANCING DUE TO THE COVID-19 PANDEMIC.

## 2021-06-13 LAB — CMP14+EGFR
ALT: 22 IU/L (ref 0–32)
AST: 19 IU/L (ref 0–40)
Albumin/Globulin Ratio: 1.6 (ref 1.2–2.2)
Albumin: 4.6 g/dL (ref 3.8–4.8)
Alkaline Phosphatase: 126 IU/L — ABNORMAL HIGH (ref 44–121)
BUN/Creatinine Ratio: 16 (ref 12–28)
BUN: 17 mg/dL (ref 8–27)
Bilirubin Total: 0.4 mg/dL (ref 0.0–1.2)
CO2: 24 mmol/L (ref 20–29)
Calcium: 9.5 mg/dL (ref 8.7–10.3)
Chloride: 99 mmol/L (ref 96–106)
Creatinine, Ser: 1.04 mg/dL — ABNORMAL HIGH (ref 0.57–1.00)
Globulin, Total: 2.9 g/dL (ref 1.5–4.5)
Glucose: 92 mg/dL (ref 65–99)
Potassium: 3.8 mmol/L (ref 3.5–5.2)
Sodium: 139 mmol/L (ref 134–144)
Total Protein: 7.5 g/dL (ref 6.0–8.5)
eGFR: 59 mL/min/{1.73_m2} — ABNORMAL LOW (ref >59)

## 2021-06-13 LAB — HEMOGLOBIN A1C
Est. average glucose Bld gHb Est-mCnc: 143 mg/dL
Hgb A1c MFr Bld: 6.6 % — ABNORMAL HIGH (ref 4.8–5.6)

## 2021-06-15 NOTE — Patient Instructions (Signed)
Visit Information It was great speaking with you today!  Please let me know if you have any questions about our visit.   Goals Addressed             This Visit's Progress    Manage My Medicine       Timeframe:  Long-Range Goal Priority:  High Start Date:                             Expected End Date:                       Follow Up Date 07/12/2021    - call for medicine refill 2 or 3 days before it runs out - call if I am sick and can't take my medicine - keep a list of all the medicines I take; vitamins and herbals too - use a pillbox to sort medicine - use an alarm clock or phone to remind me to take my medicine    Why is this important?   These steps will help you keep on track with your medicines.           Patient Care Plan: CCM Pharmacy Care Plan     Problem Identified: HLD, DM II      Goal: Disease Management   Note:   Current Barriers:  Unable to independently monitor therapeutic efficacy  Pharmacist Clinical Goal(s):  Patient will achieve adherence to monitoring guidelines and medication adherence to achieve therapeutic efficacy through collaboration with PharmD and provider.   Interventions: 1:1 collaboration with Dorothyann Peng, MD regarding development and update of comprehensive plan of care as evidenced by provider attestation and co-signature Inter-disciplinary care team collaboration (see longitudinal plan of care) Comprehensive medication review performed; medication list updated in electronic medical record  Hyperlipidemia: (LDL goal < 70) -Not ideally controlled -Current treatment: Simvastatin 40 mg tablet once per day  Aspirin 81 mg tablet once per day  -Current dietary patterns: patient has increased the amount of fiber that she is eating, when she cooks at home she uses a convection oven to prepare her meats  -Current exercise habits: she is walking around her house, about 30 minutes, and she also does 3000-5000 steps per day before she  stops walking.  Goal for patient to walk 30 minutes five times per week  -Educated on Cholesterol goals;  Benefits of statin for ASCVD risk reduction; Importance of limiting foods high in cholesterol; Exercise goal of 150 minutes per week; -Counseled on diet and exercise extensively Collaborated with PCP to change patients Simvastatin to Atorvastatin 20 mg tablet once per day.   Diabetes (A1c goal <7%) -Controlled -Current medications: Semaglutide 2 mg once per week Farxiga 10 mg tablet once per dya   -Current home glucose readings fasting glucose: 135- 107  -Denies hypoglycemic/hyperglycemic symptoms -Current meal patterns:  breakfast: bacon and egg sandwich, or special k cereal , breakfast bar with water or juice lunch: does not eat  dinner: meat and vegetables  snacks: she does not eat any snacks through out the day drinks: water, with flavor packets most have zero sugar or 5 calories  -Current exercise: please see hypertension for more details  -Educated on Complications of diabetes including kidney damage, retinal damage, and cardiovascular disease; Exercise goal of 150 minutes per week; Prevention and management of hypoglycemic episodes; -Counseled to check feet daily and get yearly eye exams -Recommended to continue current medication  Health Maintenance -Vaccine gaps: Inlluenza Vaccine - Patient received today on 06/07/2021, Pneumonia Vaccine - received today on 06/07/2021 -Recommended to continue current medication  Patient Goals/Self-Care Activities Patient will:  - take medications as prescribed  Follow Up Plan: The patient has been provided with contact information for the care management team and has been advised to call with any health related questions or concerns.       Patient agreed to services and verbal consent obtained.   The patient verbalized understanding of instructions, educational materials, and care plan provided today and agreed to receive a  mailed copy of patient instructions, educational materials, and care plan.   Cherylin Mylar, PharmD Clinical Pharmacist [facility] [phone]

## 2021-06-23 DIAGNOSIS — E782 Mixed hyperlipidemia: Secondary | ICD-10-CM

## 2021-06-23 DIAGNOSIS — N182 Chronic kidney disease, stage 2 (mild): Secondary | ICD-10-CM | POA: Diagnosis not present

## 2021-06-23 DIAGNOSIS — E1122 Type 2 diabetes mellitus with diabetic chronic kidney disease: Secondary | ICD-10-CM

## 2021-06-27 DIAGNOSIS — M5032 Other cervical disc degeneration, mid-cervical region, unspecified level: Secondary | ICD-10-CM | POA: Diagnosis not present

## 2021-06-27 DIAGNOSIS — M47814 Spondylosis without myelopathy or radiculopathy, thoracic region: Secondary | ICD-10-CM | POA: Diagnosis not present

## 2021-06-27 DIAGNOSIS — M9902 Segmental and somatic dysfunction of thoracic region: Secondary | ICD-10-CM | POA: Diagnosis not present

## 2021-06-27 DIAGNOSIS — M9901 Segmental and somatic dysfunction of cervical region: Secondary | ICD-10-CM | POA: Diagnosis not present

## 2021-06-28 ENCOUNTER — Other Ambulatory Visit: Payer: Self-pay | Admitting: Internal Medicine

## 2021-06-28 DIAGNOSIS — M9902 Segmental and somatic dysfunction of thoracic region: Secondary | ICD-10-CM | POA: Diagnosis not present

## 2021-06-28 DIAGNOSIS — M5032 Other cervical disc degeneration, mid-cervical region, unspecified level: Secondary | ICD-10-CM | POA: Diagnosis not present

## 2021-06-28 DIAGNOSIS — M47814 Spondylosis without myelopathy or radiculopathy, thoracic region: Secondary | ICD-10-CM | POA: Diagnosis not present

## 2021-06-28 DIAGNOSIS — M9901 Segmental and somatic dysfunction of cervical region: Secondary | ICD-10-CM | POA: Diagnosis not present

## 2021-07-03 DIAGNOSIS — M5032 Other cervical disc degeneration, mid-cervical region, unspecified level: Secondary | ICD-10-CM | POA: Diagnosis not present

## 2021-07-03 DIAGNOSIS — M9901 Segmental and somatic dysfunction of cervical region: Secondary | ICD-10-CM | POA: Diagnosis not present

## 2021-07-03 DIAGNOSIS — M9902 Segmental and somatic dysfunction of thoracic region: Secondary | ICD-10-CM | POA: Diagnosis not present

## 2021-07-03 DIAGNOSIS — M47814 Spondylosis without myelopathy or radiculopathy, thoracic region: Secondary | ICD-10-CM | POA: Diagnosis not present

## 2021-07-06 DIAGNOSIS — M47814 Spondylosis without myelopathy or radiculopathy, thoracic region: Secondary | ICD-10-CM | POA: Diagnosis not present

## 2021-07-06 DIAGNOSIS — M9902 Segmental and somatic dysfunction of thoracic region: Secondary | ICD-10-CM | POA: Diagnosis not present

## 2021-07-06 DIAGNOSIS — M9901 Segmental and somatic dysfunction of cervical region: Secondary | ICD-10-CM | POA: Diagnosis not present

## 2021-07-06 DIAGNOSIS — M5032 Other cervical disc degeneration, mid-cervical region, unspecified level: Secondary | ICD-10-CM | POA: Diagnosis not present

## 2021-07-08 ENCOUNTER — Other Ambulatory Visit: Payer: Self-pay | Admitting: Internal Medicine

## 2021-07-10 DIAGNOSIS — M9901 Segmental and somatic dysfunction of cervical region: Secondary | ICD-10-CM | POA: Diagnosis not present

## 2021-07-10 DIAGNOSIS — M9902 Segmental and somatic dysfunction of thoracic region: Secondary | ICD-10-CM | POA: Diagnosis not present

## 2021-07-10 DIAGNOSIS — M47814 Spondylosis without myelopathy or radiculopathy, thoracic region: Secondary | ICD-10-CM | POA: Diagnosis not present

## 2021-07-10 DIAGNOSIS — M5032 Other cervical disc degeneration, mid-cervical region, unspecified level: Secondary | ICD-10-CM | POA: Diagnosis not present

## 2021-07-11 ENCOUNTER — Telehealth: Payer: Self-pay

## 2021-07-11 NOTE — Chronic Care Management (AMB) (Signed)
    Called Monica Ryan, No answer, left message of appointment on 07-12-2021 at 4:00 via telephone visit with Cherylin Mylar, Pharm D. Notified to have all medications, supplements, blood pressure and/or blood sugar logs available during appointment and to return call if need to reschedule.   Care Gaps: Medicare wellness 10-11-2021  Star Rating Drug: Simvastatin 40 mg- Last filled 04-26-2021 90 DS [facility] Lisinopril-HCTZ 20-12.5 mg- Last filled 04-26-2021 90 DS [facility] Farxiga 10 mg- Last filled 07-10-2021 90 [facility] Ozempic 2 mg- Last filled 04-27-2021 90 [facility]  Any gaps in medications fill history? No  Huey Romans [facility] Clinical Pharmacist Assistant [phone]

## 2021-07-12 ENCOUNTER — Telehealth: Payer: Medicare HMO

## 2021-07-13 DIAGNOSIS — M5032 Other cervical disc degeneration, mid-cervical region, unspecified level: Secondary | ICD-10-CM | POA: Diagnosis not present

## 2021-07-13 DIAGNOSIS — M47814 Spondylosis without myelopathy or radiculopathy, thoracic region: Secondary | ICD-10-CM | POA: Diagnosis not present

## 2021-07-13 DIAGNOSIS — M9902 Segmental and somatic dysfunction of thoracic region: Secondary | ICD-10-CM | POA: Diagnosis not present

## 2021-07-13 DIAGNOSIS — M9901 Segmental and somatic dysfunction of cervical region: Secondary | ICD-10-CM | POA: Diagnosis not present

## 2021-07-19 ENCOUNTER — Telehealth: Payer: Self-pay

## 2021-07-19 NOTE — Chronic Care Management (AMB) (Signed)
    Chronic Care Management Pharmacy Assistant   Name: Monica Ryan  MRN: [id number] DOB: 1956/03/09   Reason for Encounter: Reschedule missed appointment    Medications: Outpatient Encounter Medications as of 07/19/2021  Medication Sig   ACCU-CHEK SMARTVIEW test strip TEST ONCE DAILY   Alcohol Swabs (ALCOHOL PADS) 70 % PADS Use as instructed to check blood sugars 1 time per day dx: e11.65   amLODipine (NORVASC) 5 MG tablet TAKE 1 TABLET BY MOUTH EVERY DAY   aspirin EC 81 MG tablet Take 81 mg by mouth daily.   Blood Glucose Calibration (TRUE METRIX LEVEL 1) Low SOLN Use as directed dx: e11.65   Blood Glucose Calibration (TRUE METRIX LEVEL 2) Normal SOLN Use as directed dx: e11.65   Blood Glucose Calibration (TRUE METRIX LEVEL 3) High SOLN Use as directed dx: e11.65   Blood Glucose Monitoring Suppl (TRUE METRIX METER) w/Device KIT USE AS DIRECTED   cyclobenzaprine (FLEXERIL) 5 MG tablet TAKE 1 TABLET BY MOUTH AT BEDTIME AS NEEDED FOR MUSCLE SPASMS.   FARXIGA 10 MG TABS tablet TAKE 1 TABLET BY MOUTH EVERY DAY IN THE MORNING   lisinopril-hydrochlorothiazide (ZESTORETIC) 20-12.5 MG tablet Take 1 tablet by mouth daily.   scopolamine (TRANSDERM-SCOP, 1.5 MG,) 1 MG/3DAYS Place 1 patch (1.5 mg total) onto the skin every 3 (three) days.   Semaglutide, 2 MG/DOSE, (OZEMPIC, 2 MG/DOSE,) 8 MG/3ML SOPN Inject 2 mg into the skin once a week.   simvastatin (ZOCOR) 40 MG tablet TAKE 1 TABLET EVERY DAY   TRUEplus Lancets 28G MISC USE AS INSTRUCTED TO CHECK BLOOD SUGARS 1 TIME PER DAY   No facility-administered encounter medications on file as of 07/19/2021.   07-19-2021: left patient a voicemail to call and reschedule missed appointment with [name] CPP.  [place] Pharmacist Assistant [phone]

## 2021-07-30 ENCOUNTER — Encounter: Payer: Self-pay | Admitting: Internal Medicine

## 2021-08-01 DIAGNOSIS — M9902 Segmental and somatic dysfunction of thoracic region: Secondary | ICD-10-CM | POA: Diagnosis not present

## 2021-08-01 DIAGNOSIS — M5032 Other cervical disc degeneration, mid-cervical region, unspecified level: Secondary | ICD-10-CM | POA: Diagnosis not present

## 2021-08-01 DIAGNOSIS — M9901 Segmental and somatic dysfunction of cervical region: Secondary | ICD-10-CM | POA: Diagnosis not present

## 2021-08-01 DIAGNOSIS — M47814 Spondylosis without myelopathy or radiculopathy, thoracic region: Secondary | ICD-10-CM | POA: Diagnosis not present

## 2021-08-07 DIAGNOSIS — M9901 Segmental and somatic dysfunction of cervical region: Secondary | ICD-10-CM | POA: Diagnosis not present

## 2021-08-07 DIAGNOSIS — M5032 Other cervical disc degeneration, mid-cervical region, unspecified level: Secondary | ICD-10-CM | POA: Diagnosis not present

## 2021-08-07 DIAGNOSIS — M9902 Segmental and somatic dysfunction of thoracic region: Secondary | ICD-10-CM | POA: Diagnosis not present

## 2021-08-07 DIAGNOSIS — M47814 Spondylosis without myelopathy or radiculopathy, thoracic region: Secondary | ICD-10-CM | POA: Diagnosis not present

## 2021-08-10 DIAGNOSIS — M9901 Segmental and somatic dysfunction of cervical region: Secondary | ICD-10-CM | POA: Diagnosis not present

## 2021-08-10 DIAGNOSIS — M9902 Segmental and somatic dysfunction of thoracic region: Secondary | ICD-10-CM | POA: Diagnosis not present

## 2021-08-10 DIAGNOSIS — M47814 Spondylosis without myelopathy or radiculopathy, thoracic region: Secondary | ICD-10-CM | POA: Diagnosis not present

## 2021-08-10 DIAGNOSIS — M5032 Other cervical disc degeneration, mid-cervical region, unspecified level: Secondary | ICD-10-CM | POA: Diagnosis not present

## 2021-08-15 ENCOUNTER — Other Ambulatory Visit: Payer: Self-pay | Admitting: Internal Medicine

## 2021-08-15 DIAGNOSIS — M9902 Segmental and somatic dysfunction of thoracic region: Secondary | ICD-10-CM | POA: Diagnosis not present

## 2021-08-15 DIAGNOSIS — M5032 Other cervical disc degeneration, mid-cervical region, unspecified level: Secondary | ICD-10-CM | POA: Diagnosis not present

## 2021-08-15 DIAGNOSIS — M47814 Spondylosis without myelopathy or radiculopathy, thoracic region: Secondary | ICD-10-CM | POA: Diagnosis not present

## 2021-08-15 DIAGNOSIS — M9901 Segmental and somatic dysfunction of cervical region: Secondary | ICD-10-CM | POA: Diagnosis not present

## 2021-08-18 ENCOUNTER — Other Ambulatory Visit: Payer: Self-pay | Admitting: Internal Medicine

## 2021-08-28 ENCOUNTER — Other Ambulatory Visit: Payer: Self-pay | Admitting: Internal Medicine

## 2021-08-29 DIAGNOSIS — M9901 Segmental and somatic dysfunction of cervical region: Secondary | ICD-10-CM | POA: Diagnosis not present

## 2021-08-29 DIAGNOSIS — M9902 Segmental and somatic dysfunction of thoracic region: Secondary | ICD-10-CM | POA: Diagnosis not present

## 2021-08-29 DIAGNOSIS — M5032 Other cervical disc degeneration, mid-cervical region, unspecified level: Secondary | ICD-10-CM | POA: Diagnosis not present

## 2021-08-29 DIAGNOSIS — M47814 Spondylosis without myelopathy or radiculopathy, thoracic region: Secondary | ICD-10-CM | POA: Diagnosis not present

## 2021-09-05 DIAGNOSIS — M9902 Segmental and somatic dysfunction of thoracic region: Secondary | ICD-10-CM | POA: Diagnosis not present

## 2021-09-05 DIAGNOSIS — M9901 Segmental and somatic dysfunction of cervical region: Secondary | ICD-10-CM | POA: Diagnosis not present

## 2021-09-05 DIAGNOSIS — M5032 Other cervical disc degeneration, mid-cervical region, unspecified level: Secondary | ICD-10-CM | POA: Diagnosis not present

## 2021-09-05 DIAGNOSIS — M47814 Spondylosis without myelopathy or radiculopathy, thoracic region: Secondary | ICD-10-CM | POA: Diagnosis not present

## 2021-09-07 ENCOUNTER — Encounter: Payer: Self-pay | Admitting: Sports Medicine

## 2021-09-07 ENCOUNTER — Ambulatory Visit: Payer: Medicare HMO | Admitting: Sports Medicine

## 2021-09-07 ENCOUNTER — Other Ambulatory Visit: Payer: Self-pay

## 2021-09-07 DIAGNOSIS — M79672 Pain in left foot: Secondary | ICD-10-CM

## 2021-09-07 DIAGNOSIS — M779 Enthesopathy, unspecified: Secondary | ICD-10-CM

## 2021-09-07 DIAGNOSIS — M2141 Flat foot [pes planus] (acquired), right foot: Secondary | ICD-10-CM

## 2021-09-07 DIAGNOSIS — M722 Plantar fascial fibromatosis: Secondary | ICD-10-CM | POA: Diagnosis not present

## 2021-09-07 DIAGNOSIS — E1122 Type 2 diabetes mellitus with diabetic chronic kidney disease: Secondary | ICD-10-CM

## 2021-09-07 DIAGNOSIS — N182 Chronic kidney disease, stage 2 (mild): Secondary | ICD-10-CM

## 2021-09-07 DIAGNOSIS — M2142 Flat foot [pes planus] (acquired), left foot: Secondary | ICD-10-CM

## 2021-09-07 DIAGNOSIS — M79671 Pain in right foot: Secondary | ICD-10-CM | POA: Diagnosis not present

## 2021-09-07 MED ORDER — DICLOFENAC SODIUM 75 MG PO TBEC: 75.0000 mg | DELAYED_RELEASE_TABLET | Freq: Two times a day (BID) | ORAL | 0 refills | Status: DC

## 2021-09-07 NOTE — Progress Notes (Signed)
Subjective: Monica Ryan is a 65 y.o. female patient returns to office with complaint of pain to the bottom of her feet and arches and now on lateral side of the foot on the left. States that she is still using brace with helps. States that she has not been icing but still have pain.   Patient Active Problem List   Diagnosis Date Noted   Type 2 diabetes mellitus with stage 2 chronic kidney disease, without long-term current use of insulin ([place]) 09/30/2018   Chronic renal disease, stage II 09/30/2018   Hypertensive nephropathy 09/30/2018   Left foot pain 09/30/2018    Current Outpatient Medications on File Prior to Visit  Medication Sig Dispense Refill   Alcohol Swabs (ALCOHOL PADS) 70 % PADS Use as instructed to check blood sugars 1 time per day dx: e11.65 150 each 2   amLODipine (NORVASC) 5 MG tablet TAKE 1 TABLET BY MOUTH EVERY DAY 90 tablet 1   aspirin EC 81 MG tablet Take 81 mg by mouth daily.     Blood Glucose Calibration (TRUE METRIX LEVEL 1) Low SOLN Use as directed dx: e11.65 150 each 2   Blood Glucose Calibration (TRUE METRIX LEVEL 2) Normal SOLN Use as directed dx: e11.65 1 each 2   Blood Glucose Calibration (TRUE METRIX LEVEL 3) High SOLN Use as directed dx: e11.65 1 each 2   Blood Glucose Monitoring Suppl (TRUE METRIX METER) w/Device KIT USE AS DIRECTED 1 kit 1   cyclobenzaprine (FLEXERIL) 5 MG tablet TAKE 1 TABLET BY MOUTH AT BEDTIME AS NEEDED FOR MUSCLE SPASMS. 20 tablet 0   FARXIGA 10 MG TABS tablet TAKE 1 TABLET BY MOUTH EVERY DAY IN THE MORNING 90 tablet 1   lisinopril-hydrochlorothiazide (ZESTORETIC) 20-12.5 MG tablet Take 1 tablet by mouth daily. 90 tablet 2   MODERNA COVID-19 VACCINE 100 MCG/0.5ML injection      OZEMPIC, 1 MG/DOSE, 4 MG/3ML SOPN INJECT 1 MG ONCE WEEKLY 3 mL 3   scopolamine (TRANSDERM-SCOP, 1.5 MG,) 1 MG/3DAYS Place 1 patch (1.5 mg total) onto the skin every 3 (three) days. 4 patch 1   Semaglutide, 2 MG/DOSE, (OZEMPIC, 2 MG/DOSE,) 8 MG/3ML SOPN Inject  2 mg into the skin once a week. 9 mL 1   simvastatin (ZOCOR) 40 MG tablet TAKE 1 TABLET EVERY DAY 90 tablet 1   TRUE METRIX BLOOD GLUCOSE TEST test strip USE AS INSTRUCTED TO TEST BLOOD SUGAR 1 TIME PER DAY 100 strip 2   TRUEplus Lancets 28G MISC USE AS INSTRUCTED TO CHECK BLOOD SUGARS 1 TIME PER DAY 100 each 3   No current facility-administered medications on file prior to visit.    No Known Allergies  Objective: Physical Exam General: The patient is alert and oriented x3 in no acute distress.  Dermatology: Skin is warm, dry and supple bilateral lower extremities. Nails 1-10 are normal and well manicured with glitter polish on. There is no erythema, edema, no eccymosis, no open lesions present. Integument is otherwise unremarkable.  Vascular: Dorsalis Pedis pulse and Posterior Tibial pulse are 2/4 bilateral. Capillary fill time is immediate to all digits.  Neurological: Grossly intact to light touch. Vibratory sensation intact bilateral.  Musculoskeletal: Mild reproducible tenderness to palpation at the medial calcaneal tubercale and through the insertion of the plantar fascia on the left and right foot and now at lateral foot at PB insertion on the left. Pes planus bilateral. Asymptomatic bunion bilateral. Decreased ankle joint range of motion bilateral. Strength 5/5 in all groups  bilateral.  ° °Assessment and Plan: °Problem List Items Addressed This Visit   ° °  ° Endocrine  ° Type 2 diabetes mellitus with stage 2 chronic kidney disease, without long-term current use of insulin (HCC)  ° °Other Visit Diagnoses   ° ° Plantar fasciitis    -  Primary  ° Foot pain, bilateral      ° Tendinitis      ° Pes planus of both feet      ° °  ° °-Complete examination performed.  °-Re-Discussed with patient in detail the condition of plantar fasciitis and now tendonitis secondary to foot type pes planus °-Rx Diclofenac  °-Rx Night splint  °-Recommend diabetic shoes and insoles; see Brian for shoe  measurements °-Continue daily stretching °-Recommend patient to ice affected area 1-2x daily. °-Patient to return to office in 1 month if no better injection or sooner if problems or questions arise. ° °Titorya Stover, DPM ° °

## 2021-09-13 ENCOUNTER — Encounter: Payer: Self-pay | Admitting: Sports Medicine

## 2021-09-14 DIAGNOSIS — E119 Type 2 diabetes mellitus without complications: Secondary | ICD-10-CM | POA: Diagnosis not present

## 2021-09-14 LAB — HM DIABETES EYE EXAM

## 2021-09-19 ENCOUNTER — Telehealth: Payer: Self-pay

## 2021-09-19 NOTE — Progress Notes (Signed)
° ° °  Chronic Care Management Pharmacy Assistant   Name: Monica Ryan  MRN: [id number] DOB: [birth year]   Reason for Encounter: Disease State/ General Adherence Call    Recent office visits: None  Recent consult visits:   09/07/2021 Landis Martins DPM (Podiatry) start Diclofenac Sodium 75 mg Oral 2 times daily  Hospital visits:  None in previous 6 months  Medications: Outpatient Encounter Medications as of 09/19/2021  Medication Sig   Alcohol Swabs (ALCOHOL PADS) 70 % PADS Use as instructed to check blood sugars 1 time per day dx: e11.65   amLODipine (NORVASC) 5 MG tablet TAKE 1 TABLET BY MOUTH EVERY DAY   aspirin EC 81 MG tablet Take 81 mg by mouth daily.   Blood Glucose Calibration (TRUE METRIX LEVEL 1) Low SOLN Use as directed dx: e11.65   Blood Glucose Calibration (TRUE METRIX LEVEL 2) Normal SOLN Use as directed dx: e11.65   Blood Glucose Calibration (TRUE METRIX LEVEL 3) High SOLN Use as directed dx: e11.65   Blood Glucose Monitoring Suppl (TRUE METRIX METER) w/Device KIT USE AS DIRECTED   cyclobenzaprine (FLEXERIL) 5 MG tablet TAKE 1 TABLET BY MOUTH AT BEDTIME AS NEEDED FOR MUSCLE SPASMS.   diclofenac (VOLTAREN) 75 MG EC tablet Take 1 tablet (75 mg total) by mouth 2 (two) times daily.   FARXIGA 10 MG TABS tablet TAKE 1 TABLET BY MOUTH EVERY DAY IN THE MORNING   lisinopril-hydrochlorothiazide (ZESTORETIC) 20-12.5 MG tablet Take 1 tablet by mouth daily.   MODERNA COVID-19 VACCINE 100 MCG/0.5ML injection    OZEMPIC, 1 MG/DOSE, 4 MG/3ML SOPN INJECT 1 MG ONCE WEEKLY   scopolamine (TRANSDERM-SCOP, 1.5 MG,) 1 MG/3DAYS Place 1 patch (1.5 mg total) onto the skin every 3 (three) days.   Semaglutide, 2 MG/DOSE, (OZEMPIC, 2 MG/DOSE,) 8 MG/3ML SOPN Inject 2 mg into the skin once a week.   simvastatin (ZOCOR) 40 MG tablet TAKE 1 TABLET EVERY DAY   TRUE METRIX BLOOD GLUCOSE TEST test strip USE AS INSTRUCTED TO TEST BLOOD SUGAR 1 TIME PER DAY   TRUEplus Lancets 28G MISC USE AS INSTRUCTED  TO CHECK BLOOD SUGARS 1 TIME PER DAY   No facility-administered encounter medications on file as of 09/19/2021.   Called patient 09/19/2021 left message  Called patient 09/20/2021 left message  Called patient 09/21/2021 left message   Care Gaps: Last annual wellness visit- 10/11/2021 Last eye exam - 09/19/2020 over due Diabetic foot exam- 10/04/2020   Star Medications: Medication Name/mg Last Fill Days Supply  Simvastatin 40 mg-                08/03, 10/18             90 DS     Lisinopril-HCTZ 20-12.5 mg-        08/03, 10/15         90 DS Farxiga 10 mg-           01/13, 10/17              90 DS  Ozempic 2 mg-  Patient Assistant Program    Ambulance person Clinical Pharmacist Assistant [phone]

## 2021-09-20 ENCOUNTER — Other Ambulatory Visit: Payer: Self-pay | Admitting: Internal Medicine

## 2021-09-25 ENCOUNTER — Other Ambulatory Visit: Payer: Self-pay | Admitting: Sports Medicine

## 2021-10-11 ENCOUNTER — Other Ambulatory Visit: Payer: Self-pay

## 2021-10-11 ENCOUNTER — Ambulatory Visit (INDEPENDENT_AMBULATORY_CARE_PROVIDER_SITE_OTHER): Payer: Medicare PPO

## 2021-10-11 VITALS — BP 124/62 | HR 79 | Temp 98.0°F | Ht 61.0 in | Wt 181.2 lb

## 2021-10-11 DIAGNOSIS — N182 Chronic kidney disease, stage 2 (mild): Secondary | ICD-10-CM

## 2021-10-11 DIAGNOSIS — Z0000 Encounter for general adult medical examination without abnormal findings: Secondary | ICD-10-CM | POA: Diagnosis not present

## 2021-10-11 DIAGNOSIS — E1122 Type 2 diabetes mellitus with diabetic chronic kidney disease: Secondary | ICD-10-CM | POA: Diagnosis not present

## 2021-10-11 LAB — POCT URINALYSIS DIPSTICK
Bilirubin, UA: NEGATIVE
Glucose, UA: POSITIVE — AB
Ketones, UA: NEGATIVE
Leukocytes, UA: NEGATIVE
Nitrite, UA: NEGATIVE
Protein, UA: NEGATIVE
Spec Grav, UA: 1.025 (ref 1.010–1.025)
Urobilinogen, UA: 0.2 E.U./dL
pH, UA: 5 (ref 5.0–8.0)

## 2021-10-11 LAB — POCT UA - MICROALBUMIN
Albumin/Creatinine Ratio, Urine, POC: <30
Creatinine, POC: 300 mg/dL
Microalbumin Ur, POC: 10 mg/L

## 2021-10-11 NOTE — Addendum Note (Signed)
Addended by: Barb Merino on: 10/11/2021 09:43 AM   Modules accepted: Orders

## 2021-10-11 NOTE — Patient Instructions (Signed)
[name] , Thank you for taking time to come for your Medicare Wellness Visit. I appreciate your ongoing commitment to your health goals. Please review the following plan we discussed and let me know if I can assist you in the future.   Screening recommendations/referrals: Colonoscopy: completed 01/10/2015 Mammogram: completed 05/04/2021 Bone Density: completed 06/28/2020 Recommended yearly ophthalmology/optometry visit for glaucoma screening and checkup Recommended yearly dental visit for hygiene and checkup  Vaccinations: Influenza vaccine: completed 06/07/2021 Pneumococcal vaccine: completed 06/07/2021 Tdap vaccine: completed 02/06/2013, due 02/07/2023 Shingles vaccine: completed   Covid-19: 07/10/2021, 03/13/2021, 07/26/2020, 11/21/2019, 10/17/2019  Advanced directives: Please bring a copy of your POA (Power of Attorney) and/or Living Will to your next appointment.   Conditions/risks identified: none  Next appointment: Follow up in one year for your annual wellness visit    Preventive Care 65 Years and Older, Female Preventive care refers to lifestyle choices and visits with your health care provider that can promote health and wellness. What does preventive care include? A yearly physical exam. This is also called an annual well check. Dental exams once or twice a year. Routine eye exams. Ask your health care provider how often you should have your eyes checked. Personal lifestyle choices, including: Daily care of your teeth and gums. Regular physical activity. Eating a healthy diet. Avoiding tobacco and drug use. Limiting alcohol use. Practicing safe sex. Taking low-dose aspirin every day. Taking vitamin and mineral supplements as recommended by your health care provider. What happens during an annual well check? The services and screenings done by your health care provider during your annual well check will depend on your age, overall health, lifestyle risk factors, and family  history of disease. Counseling  Your health care provider may ask you questions about your: Alcohol use. Tobacco use. Drug use. Emotional well-being. Home and relationship well-being. Sexual activity. Eating habits. History of falls. Memory and ability to understand (cognition). Work and work Astronomer. Reproductive health. Screening  You may have the following tests or measurements: Height, weight, and BMI. Blood pressure. Lipid and cholesterol levels. These may be checked every 5 years, or more frequently if you are over 41 years old. Skin check. Lung cancer screening. You may have this screening every year starting at age 58 if you have a 30-pack-year history of smoking and currently smoke or have quit within the past 15 years. Fecal occult blood test (FOBT) of the stool. You may have this test every year starting at age 4. Flexible sigmoidoscopy or colonoscopy. You may have a sigmoidoscopy every 5 years or a colonoscopy every 10 years starting at age 29. Hepatitis C blood test. Hepatitis B blood test. Sexually transmitted disease (STD) testing. Diabetes screening. This is done by checking your blood sugar (glucose) after you have not eaten for a while (fasting). You may have this done every 1-3 years. Bone density scan. This is done to screen for osteoporosis. You may have this done starting at age 3. Mammogram. This may be done every 1-2 years. Talk to your health care provider about how often you should have regular mammograms. Talk with your health care provider about your test results, treatment options, and if necessary, the need for more tests. Vaccines  Your health care provider may recommend certain vaccines, such as: Influenza vaccine. This is recommended every year. Tetanus, diphtheria, and acellular pertussis (Tdap, Td) vaccine. You may need a Td booster every 10 years. Zoster vaccine. You may need this after age 70. Pneumococcal 13-valent conjugate (PCV13)  vaccine. One dose is recommended after age 52. Pneumococcal polysaccharide (PPSV23) vaccine. One dose is recommended after age 15. Talk to your health care provider about which screenings and vaccines you need and how often you need them. This information is not intended to replace advice given to you by your health care provider. Make sure you discuss any questions you have with your health care provider. Document Released: 10/07/2015 Document Revised: 05/30/2016 Document Reviewed: 07/12/2015 Elsevier Interactive Patient Education  2017 [employer] Prevention in the Home Falls can cause injuries. They can happen to people of all ages. There are many things you can do to make your home safe and to help prevent falls. What can I do on the outside of my home? Regularly fix the edges of walkways and driveways and fix any cracks. Remove anything that might make you trip as you walk through a door, such as a raised step or threshold. Trim any bushes or trees on the path to your home. Use bright outdoor lighting. Clear any walking paths of anything that might make someone trip, such as rocks or tools. Regularly check to see if handrails are loose or broken. Make sure that both sides of any steps have handrails. Any raised decks and porches should have guardrails on the edges. Have any leaves, snow, or ice cleared regularly. Use sand or salt on walking paths during winter. Clean up any spills in your garage right away. This includes oil or grease spills. What can I do in the bathroom? Use night lights. Install grab bars by the toilet and in the tub and shower. Do not use towel bars as grab bars. Use non-skid mats or decals in the tub or shower. If you need to sit down in the shower, use a plastic, non-slip stool. Keep the floor dry. Clean up any water that spills on the floor as soon as it happens. Remove soap buildup in the tub or shower regularly. Attach bath mats securely with  double-sided non-slip rug tape. Do not have throw rugs and other things on the floor that can make you trip. What can I do in the bedroom? Use night lights. Make sure that you have a light by your bed that is easy to reach. Do not use any sheets or blankets that are too big for your bed. They should not hang down onto the floor. Have a firm chair that has side arms. You can use this for support while you get dressed. Do not have throw rugs and other things on the floor that can make you trip. What can I do in the kitchen? Clean up any spills right away. Avoid walking on wet floors. Keep items that you use a lot in easy-to-reach places. If you need to reach something above you, use a strong step stool that has a grab bar. Keep electrical cords out of the way. Do not use floor polish or wax that makes floors slippery. If you must use wax, use non-skid floor wax. Do not have throw rugs and other things on the floor that can make you trip. What can I do with my stairs? Do not leave any items on the stairs. Make sure that there are handrails on both sides of the stairs and use them. Fix handrails that are broken or loose. Make sure that handrails are as long as the stairways. Check any carpeting to make sure that it is firmly attached to the stairs. Fix any carpet that is loose or  worn. Avoid having throw rugs at the top or bottom of the stairs. If you do have throw rugs, attach them to the floor with carpet tape. Make sure that you have a light switch at the top of the stairs and the bottom of the stairs. If you do not have them, ask someone to add them for you. What else can I do to help prevent falls? Wear shoes that: Do not have high heels. Have rubber bottoms. Are comfortable and fit you well. Are closed at the toe. Do not wear sandals. If you use a stepladder: Make sure that it is fully opened. Do not climb a closed stepladder. Make sure that both sides of the stepladder are locked  into place. Ask someone to hold it for you, if possible. Clearly mark and make sure that you can see: Any grab bars or handrails. First and last steps. Where the edge of each step is. Use tools that help you move around (mobility aids) if they are needed. These include: Canes. Walkers. Scooters. Crutches. Turn on the lights when you go into a dark area. Replace any light bulbs as soon as they burn out. Set up your furniture so you have a clear path. Avoid moving your furniture around. If any of your floors are uneven, fix them. If there are any pets around you, be aware of where they are. Review your medicines with your doctor. Some medicines can make you feel dizzy. This can increase your chance of falling. Ask your doctor what other things that you can do to help prevent falls. This information is not intended to replace advice given to you by your health care provider. Make sure you discuss any questions you have with your health care provider. Document Released: 07/07/2009 Document Revised: 02/16/2016 Document Reviewed: 10/15/2014 Elsevier Interactive Patient Education  2017 [employer].

## 2021-10-11 NOTE — Progress Notes (Signed)
This visit occurred during the SARS-CoV-2 public health emergency.  Safety protocols were in place, including screening questions prior to the visit, additional usage of staff PPE, and extensive cleaning of exam room while observing appropriate contact time as indicated for disinfecting solutions.  Subjective:   Monica Ryan is a 66 y.o. female who presents for an Initial Medicare Annual Wellness Visit.  Review of Systems     Cardiac Risk Factors include: advanced age (>63men, >45 women);diabetes mellitus;hypertension;obesity (BMI >30kg/m2)     Objective:    Today's Vitals   10/11/21 0816  BP: 124/62  Pulse: 79  Temp: 98 F (36.7 C)  TempSrc: Oral  SpO2: 95%  Weight: 181 lb 3.2 oz (82.2 kg)  Height: $Remove'5\' 1"'RbvmNaR$  (1.549 m)   Body mass index is 34.24 kg/m.  Advanced Directives 10/11/2021 10/04/2020 07/24/2012  Does Patient Have a Medical Advance Directive? Yes No Patient does not have advance directive  Type of Scientist, forensic Power of [place];Living will - -  Does patient want to make changes to medical advance directive? - Yes (MAU/Ambulatory/Procedural Areas - Information given) -  Copy of [place] in Chart? No - copy requested - -  Would patient like information on creating a medical advance directive? - Yes (MAU/Ambulatory/Procedural Areas - Information given) -    Current Medications (verified) Outpatient Encounter Medications as of 10/11/2021  Medication Sig   Alcohol Swabs (ALCOHOL PADS) 70 % PADS Use as instructed to check blood sugars 1 time per day dx: e11.65   amLODipine (NORVASC) 5 MG tablet TAKE 1 TABLET BY MOUTH EVERY DAY   aspirin EC 81 MG tablet Take 81 mg by mouth daily.   Blood Glucose Calibration (TRUE METRIX LEVEL 1) Low SOLN Use as directed dx: e11.65   Blood Glucose Calibration (TRUE METRIX LEVEL 2) Normal SOLN Use as directed dx: e11.65   Blood Glucose Calibration (TRUE METRIX LEVEL 3) High SOLN Use as directed dx: e11.65    Blood Glucose Monitoring Suppl (TRUE METRIX METER) w/Device KIT USE AS DIRECTED   FARXIGA 10 MG TABS tablet TAKE 1 TABLET BY MOUTH EVERY DAY IN THE MORNING   lisinopril-hydrochlorothiazide (ZESTORETIC) 20-12.5 MG tablet TAKE 1 TABLET EVERY DAY   Semaglutide, 2 MG/DOSE, (OZEMPIC, 2 MG/DOSE,) 8 MG/3ML SOPN Inject 2 mg into the skin once a week.   simvastatin (ZOCOR) 40 MG tablet TAKE 1 TABLET EVERY DAY   TRUE METRIX BLOOD GLUCOSE TEST test strip USE AS INSTRUCTED TO TEST BLOOD SUGAR 1 TIME PER DAY   TRUEplus Lancets 28G MISC USE AS INSTRUCTED TO CHECK BLOOD SUGARS 1 TIME PER DAY   cyclobenzaprine (FLEXERIL) 5 MG tablet TAKE 1 TABLET BY MOUTH AT BEDTIME AS NEEDED FOR MUSCLE SPASMS. (Patient not taking: Reported on 10/11/2021)   diclofenac (VOLTAREN) 75 MG EC tablet TAKE 1 TABLET BY MOUTH TWICE A DAY (Patient not taking: Reported on 10/11/2021)   MODERNA COVID-19 VACCINE 100 MCG/0.5ML injection    OZEMPIC, 1 MG/DOSE, 4 MG/3ML SOPN INJECT 1 MG ONCE WEEKLY (Patient not taking: Reported on 10/11/2021)   scopolamine (TRANSDERM-SCOP, 1.5 MG,) 1 MG/3DAYS Place 1 patch (1.5 mg total) onto the skin every 3 (three) days. (Patient not taking: Reported on 10/11/2021)   No facility-administered encounter medications on file as of 10/11/2021.    Allergies (verified) Patient has no known allergies.   History: Past Medical History:  Diagnosis Date   Diabetes mellitus without complication ([place])    Hyperlipemia    Hypertension  Past Surgical History:  Procedure Laterality Date   BREAST BIOPSY Right 2000   BREAST EXCISIONAL BIOPSY Bilateral 06/14/1999   benign   BREAST EXCISIONAL BIOPSY Left 1974   benign   COLONOSCOPY     SHOULDER ARTHROSCOPY WITH ROTATOR CUFF REPAIR AND SUBACROMIAL DECOMPRESSION  07/29/2012   Procedure: SHOULDER ARTHROSCOPY WITH ROTATOR CUFF REPAIR AND SUBACROMIAL DECOMPRESSION;  Surgeon: Cammie Sickle., MD;  Location: [facility];  Service: Orthopedics;  Laterality:  Right;  Arthroscopy Subacromial Decompression, Distal Clavicle Resection, Arthroscopic Subscapularis Repair, Open Repair of Supraspinatus and Infraspinatus     TUBAL LIGATION     TUMOR EXCISION     right jaw   Family History  Problem Relation Age of Onset   Diabetes Mother    Alzheimer's disease Father    Diabetes Sister    Diabetes Brother    Social History   Socioeconomic History   Marital status: Single    Spouse name: Not on file   Number of children: Not on file   Years of education: Not on file   Highest education level: Not on file  Occupational History   Not on file  Tobacco Use   Smoking status: Never   Smokeless tobacco: Never   Tobacco comments:    n/a  Vaping Use   Vaping Use: Never used  Substance and Sexual Activity   Alcohol use: Not Currently    Comment: occ   Drug use: Never   Sexual activity: Not Currently  Other Topics Concern   Not on file  Social History Narrative   Not on file   Social Determinants of Health   Financial Resource Strain: Low Risk    Difficulty of Paying Living Expenses: Not hard at all  Food Insecurity: No Food Insecurity   Worried About Charity fundraiser in the Last Year: Never true   Ran Out of Food in the Last Year: Never true  Transportation Needs: No Transportation Needs   Lack of Transportation (Medical): No   Lack of Transportation (Non-Medical): No  Physical Activity: Insufficiently Active   Days of Exercise per Week: 3 days   Minutes of Exercise per Session: 30 min  Stress: No Stress Concern Present   Feeling of Stress : Not at all  Social Connections: Not on file    Tobacco Counseling Counseling given: Not Answered Tobacco comments: n/a   Clinical Intake:  Pre-visit preparation completed: Yes  Pain : No/denies pain     Nutritional Status: BMI > 30  Obese Nutritional Risks: None Diabetes: Yes  How often do you need to have someone help you when you read instructions, pamphlets, or other written  materials from your doctor or pharmacy?: 1 - Never What is the last grade level you completed in school?: college  Diabetic? Yes Nutrition Risk Assessment:  Has the patient had any N/V/D within the last 2 months?  No  Does the patient have any non-healing wounds?  No  Has the patient had any unintentional weight loss or weight gain?  No   Diabetes:  Is the patient diabetic?  Yes  If diabetic, was a CBG obtained today?  No  Did the patient bring in their glucometer from home?  No  How often do you monitor your CBG's? daily.   Financial Strains and Diabetes Management:  Are you having any financial strains with the device, your supplies or your medication? Yes .  Does the patient want to be seen by Chronic Care Management  for management of their diabetes?  Yes  Would the patient like to be referred to a Nutritionist or for Diabetic Management?  No   Diabetic Exams:  Diabetic Eye Exam: Completed 08/2021 Diabetic Foot Exam: Completed 10/04/2020   Interpreter Needed?: No  Information entered by :: NAllen LPN   Activities of Daily Living In your present state of health, do you have any difficulty performing the following activities: 10/11/2021  Hearing? N  Vision? N  Difficulty concentrating or making decisions? N  Walking or climbing stairs? N  Dressing or bathing? N  Doing errands, shopping? N  Preparing Food and eating ? N  Using the Toilet? N  In the past six months, have you accidently leaked urine? N  Do you have problems with loss of bowel control? N  Managing your Medications? N  Managing your Finances? N  Housekeeping or managing your Housekeeping? N  Some recent data might be hidden    Patient Care Team: [name], MD as PCP - General (Internal Medicine)  Indicate any recent Medical Services you may have received from other than [facility] providers in the past year (date may be approximate).     Assessment:   This is a routine wellness examination for  [place].  Hearing/Vision screen Vision Screening - Comments:: Regular eye exams, [facility]  Dietary issues and exercise activities discussed: Current Exercise Habits: Home exercise routine, Type of exercise: walking, Time (Minutes): 30, Frequency (Times/Week): 3, Weekly Exercise (Minutes/Week): 90   Goals Addressed             This Visit's Progress    Patient Stated       10/11/2021, continue to lose weight and keep A1C under 7       Depression Screen PHQ 2/9 Scores 10/11/2021 10/04/2020 07/15/2019 04/14/2019 01/13/2019 09/30/2018 07/29/2018  PHQ - 2 Score 0 0 0 0 0 0 0    Fall Risk Fall Risk  10/11/2021 10/04/2020 07/15/2019 04/14/2019 01/13/2019  Falls in the past year? 0 0 0 0 0  Number falls in past yr: - 0 - - -  Injury with Fall? - 0 - - -  Risk for fall due to : Medication side effect - - - -  Follow up Falls evaluation completed;Education provided;Falls prevention discussed - - - -    FALL RISK PREVENTION PERTAINING TO THE HOME:  Any stairs in or around the home? Yes  If so, are there any without handrails? No  Home free of loose throw rugs in walkways, pet beds, electrical cords, etc? Yes  Adequate lighting in your home to reduce risk of falls? Yes   ASSISTIVE DEVICES UTILIZED TO PREVENT FALLS:  Life alert? No  Use of a cane, walker or w/c? No  Grab bars in the bathroom? No  Shower chair or bench in shower? No  Elevated toilet seat or a handicapped toilet? Yes   TIMED UP AND GO:  Was the test performed? No .    Gait steady and fast without use of assistive device  Cognitive Function: MMSE - Mini Mental State Exam 10/04/2020  Orientation to time 5  Orientation to Place 5  Registration 3  Attention/ Calculation 5  Recall 3  Language- name 2 objects 2  Language- repeat 1  Language- follow 3 step command 3  Language- read & follow direction 1  Write a sentence 1  Copy design 1  Total score 30     6CIT Screen 10/11/2021 10/04/2020 10/04/2020  What Year?  0 points 0 points 0 points  What month? 0 points 0 points 0 points  What time? 0 points 0 points 0 points  Count back from 20 0 points 0 points -  Months in reverse 0 points 0 points -  Repeat phrase 2 points 4 points -  Total Score 2 4 -    Immunizations Immunization History  Administered Date(s) Administered   DTaP 02/06/2013   Fluad Quad(high Dose 65+) 10/04/2020   Influenza, High Dose Seasonal PF 06/07/2021   Influenza,inj,Quad PF,6+ Mos 07/15/2019   Influenza-Unspecified 06/17/2018   Moderna SARS-COV2 Booster Vaccination 07/26/2020   Moderna Sars-Covid-2 Vaccination 10/17/2019, 11/21/2019, 03/13/2021   PNEUMOCOCCAL CONJUGATE-20 06/07/2021   Pfizer Covid-19 Vaccine Bivalent Booster 34yrs & up 07/10/2021   Pneumococcal Conjugate-13 01/16/2020   Pneumococcal-Unspecified 04/20/2014   Td 07/10/2006   Zoster Recombinat (Shingrix) 04/23/2021, 06/30/2021    TDAP status: Up to date  Flu Vaccine status: Up to date  Pneumococcal vaccine status: Up to date  Covid-19 vaccine status: Completed vaccines  Qualifies for Shingles Vaccine? Yes   Zostavax completed No   Shingrix Completed?: Yes  Screening Tests Health Maintenance  Topic Date Due   OPHTHALMOLOGY EXAM  09/19/2021   FOOT EXAM  10/04/2021   HEMOGLOBIN A1C  12/10/2021   TETANUS/TDAP  02/07/2023   MAMMOGRAM  05/05/2023   COLONOSCOPY (Pts 45-45yrs Insurance coverage will need to be confirmed)  03/03/2031   Pneumonia Vaccine 98+ Years old  Completed   INFLUENZA VACCINE  Completed   DEXA SCAN  Completed   COVID-19 Vaccine  Completed   Hepatitis C Screening  Completed   Zoster Vaccines- Shingrix  Completed   HPV VACCINES  Aged Out    Health Maintenance  Health Maintenance Due  Topic Date Due   OPHTHALMOLOGY EXAM  09/19/2021   FOOT EXAM  10/04/2021    Colorectal cancer screening: Type of screening: Colonoscopy. Completed 01/10/2015. Repeat every 10 years  Mammogram status: Completed 05/04/2021. Repeat every  year  Bone Density status: Completed 06/28/2020  Lung Cancer Screening: (Low Dose CT Chest recommended if Age 56-80 years, 30 pack-year currently smoking OR have quit w/in 15years.) does not qualify.   Lung Cancer Screening Referral: no  Additional Screening:  Hepatitis C Screening: does qualify; Completed 02/06/2013  Vision Screening: Recommended annual ophthalmology exams for early detection of glaucoma and other disorders of the eye. Is the patient up to date with their annual eye exam?  Yes  Who is the provider or what is the name of the office in which the patient attends annual eye exams? [facility] If pt is not established with a provider, would they like to be referred to a provider to establish care? No .   Dental Screening: Recommended annual dental exams for proper oral hygiene  Community Resource Referral / Chronic Care Management: CRR required this visit?  No   CCM required this visit?  No      Plan:     I have personally reviewed and noted the following in the patients chart:   Medical and social history Use of alcohol, tobacco or illicit drugs  Current medications and supplements including opioid prescriptions. Patient is not currently taking opioid prescriptions. Functional ability and status Nutritional status Physical activity Advanced directives List of other physicians Hospitalizations, surgeries, and ER visits in previous 12 months Vitals Screenings to include cognitive, depression, and falls Referrals and appointments  In addition, I have reviewed and discussed with patient certain preventive protocols, quality metrics, and  best practice recommendations. A written personalized care plan for preventive services as well as general preventive health recommendations were provided to patient.     Kellie Simmering, LPN   [id number]   Nurse Notes: none

## 2021-10-11 NOTE — Addendum Note (Signed)
Addended by: Kellie Simmering on: 10/11/2021 04:17 PM   Modules accepted: Orders

## 2021-10-12 ENCOUNTER — Other Ambulatory Visit: Payer: Medicare HMO

## 2021-10-12 ENCOUNTER — Ambulatory Visit: Payer: Medicare HMO | Admitting: Sports Medicine

## 2021-10-13 ENCOUNTER — Encounter: Payer: Self-pay | Admitting: Sports Medicine

## 2021-10-13 NOTE — Telephone Encounter (Signed)
Please reschedule appointments for patient.thanks!

## 2021-10-18 ENCOUNTER — Other Ambulatory Visit: Payer: Self-pay

## 2021-10-23 ENCOUNTER — Telehealth: Payer: Self-pay

## 2021-10-23 NOTE — Chronic Care Management (AMB) (Signed)
° ° °  Chronic Care Management Pharmacy Assistant   Name: Monica Ryan  MRN: [id number] DOB: Feb 08, 1956  Reason for Encounter: Patient assistance documentation    Medications: Outpatient Encounter Medications as of 10/23/2021  Medication Sig   Alcohol Swabs (ALCOHOL PADS) 70 % PADS Use as instructed to check blood sugars 1 time per day dx: e11.65   amLODipine (NORVASC) 5 MG tablet TAKE 1 TABLET BY MOUTH EVERY DAY   aspirin EC 81 MG tablet Take 81 mg by mouth daily.   Blood Glucose Calibration (TRUE METRIX LEVEL 1) Low SOLN Use as directed dx: e11.65   Blood Glucose Calibration (TRUE METRIX LEVEL 2) Normal SOLN Use as directed dx: e11.65   Blood Glucose Calibration (TRUE METRIX LEVEL 3) High SOLN Use as directed dx: e11.65   Blood Glucose Monitoring Suppl (TRUE METRIX METER) w/Device KIT USE AS DIRECTED   cyclobenzaprine (FLEXERIL) 5 MG tablet TAKE 1 TABLET BY MOUTH AT BEDTIME AS NEEDED FOR MUSCLE SPASMS. (Patient not taking: Reported on 10/11/2021)   diclofenac (VOLTAREN) 75 MG EC tablet TAKE 1 TABLET BY MOUTH TWICE A DAY (Patient not taking: Reported on 10/11/2021)   FARXIGA 10 MG TABS tablet TAKE 1 TABLET BY MOUTH EVERY DAY IN THE MORNING   lisinopril-hydrochlorothiazide (ZESTORETIC) 20-12.5 MG tablet TAKE 1 TABLET EVERY DAY   MODERNA COVID-19 VACCINE 100 MCG/0.5ML injection    OZEMPIC, 1 MG/DOSE, 4 MG/3ML SOPN INJECT 1 MG ONCE WEEKLY (Patient not taking: Reported on 10/11/2021)   scopolamine (TRANSDERM-SCOP, 1.5 MG,) 1 MG/3DAYS Place 1 patch (1.5 mg total) onto the skin every 3 (three) days. (Patient not taking: Reported on 10/11/2021)   Semaglutide, 2 MG/DOSE, (OZEMPIC, 2 MG/DOSE,) 8 MG/3ML SOPN Inject 2 mg into the skin once a week.   simvastatin (ZOCOR) 40 MG tablet TAKE 1 TABLET EVERY DAY   TRUE METRIX BLOOD GLUCOSE TEST test strip USE AS INSTRUCTED TO TEST BLOOD SUGAR 1 TIME PER DAY   TRUEplus Lancets 28G MISC USE AS INSTRUCTED TO CHECK BLOOD SUGARS 1 TIME PER DAY   No  facility-administered encounter medications on file as of 10/23/2021.   10-23-2021: Initiated patient assistance application for farxiga to be mailed and returned with income.  [facility] Pharmacist Assistant [phone]

## 2021-10-29 ENCOUNTER — Other Ambulatory Visit: Payer: Self-pay | Admitting: Nurse Practitioner

## 2021-10-30 ENCOUNTER — Encounter: Payer: Self-pay | Admitting: Internal Medicine

## 2021-10-30 ENCOUNTER — Ambulatory Visit (INDEPENDENT_AMBULATORY_CARE_PROVIDER_SITE_OTHER): Payer: No Typology Code available for payment source | Admitting: Internal Medicine

## 2021-10-30 ENCOUNTER — Other Ambulatory Visit: Payer: Self-pay

## 2021-10-30 VITALS — BP 132/80 | HR 76 | Temp 98.0°F | Ht 60.8 in | Wt 182.8 lb

## 2021-10-30 DIAGNOSIS — Z0000 Encounter for general adult medical examination without abnormal findings: Secondary | ICD-10-CM

## 2021-10-30 DIAGNOSIS — E559 Vitamin D deficiency, unspecified: Secondary | ICD-10-CM

## 2021-10-30 DIAGNOSIS — I129 Hypertensive chronic kidney disease with stage 1 through stage 4 chronic kidney disease, or unspecified chronic kidney disease: Secondary | ICD-10-CM | POA: Diagnosis not present

## 2021-10-30 DIAGNOSIS — R197 Diarrhea, unspecified: Secondary | ICD-10-CM | POA: Diagnosis not present

## 2021-10-30 DIAGNOSIS — Z6834 Body mass index (BMI) 34.0-34.9, adult: Secondary | ICD-10-CM

## 2021-10-30 DIAGNOSIS — E1122 Type 2 diabetes mellitus with diabetic chronic kidney disease: Secondary | ICD-10-CM | POA: Diagnosis not present

## 2021-10-30 DIAGNOSIS — E6609 Other obesity due to excess calories: Secondary | ICD-10-CM

## 2021-10-30 DIAGNOSIS — N182 Chronic kidney disease, stage 2 (mild): Secondary | ICD-10-CM

## 2021-10-30 DIAGNOSIS — E66811 Obesity, class 1: Secondary | ICD-10-CM

## 2021-10-30 HISTORY — DX: Diarrhea, unspecified: R19.7

## 2021-10-30 MED ORDER — ONETOUCH VERIO REFLECT W/DEVICE KIT: PACK | 1 refills | Status: DC

## 2021-10-30 NOTE — Progress Notes (Signed)
Rich Brave Llittleton,acting as a Education administrator for [name], MD.,have documented all relevant documentation on the behalf of [name], MD,as directed by  [name], MD while in the presence of [name], MD.  This visit occurred during the SARS-CoV-2 public health emergency.  Safety protocols were in place, including screening questions prior to the visit, additional usage of staff PPE, and extensive cleaning of exam room while observing appropriate contact time as indicated for disinfecting solutions.  Subjective:     Patient ID: Monica Ryan , female    DOB: 12/29/1955 , 66 y.o.   MRN: [id number]   Chief Complaint  Patient presents with   Annual Exam    HPI  Patient presents for her physical. She does not have a GYN provider. She admits she did not take BP meds this morning. She states her last eye exam was in December 2022.   Diabetes She presents for her follow-up diabetic visit. She has type 2 diabetes mellitus. Her disease course has been stable. There are no hypoglycemic associated symptoms. Pertinent negatives for diabetes include no blurred vision. There are no hypoglycemic complications. Risk factors for coronary artery disease include diabetes mellitus, dyslipidemia, hypertension and post-menopausal. She is following a diabetic diet. She participates in exercise three times a week. Her breakfast blood glucose is taken between 7-8 am. Her breakfast blood glucose range is generally 90-110 mg/dl. An ACE inhibitor/angiotensin II receptor blocker is being taken. Eye exam is current.  Hypertension This is a chronic problem. The current episode started more than 1 year ago. The problem is controlled. Pertinent negatives include no blurred vision. Risk factors for coronary artery disease include diabetes mellitus, dyslipidemia and obesity. The current treatment provides moderate improvement.    Past Medical History:  Diagnosis Date   Diabetes mellitus without  complication ([place])    Hyperlipemia    Hypertension      Family History  Problem Relation Age of Onset   Diabetes Mother    Alzheimer's disease Father    Diabetes Sister    Diabetes Brother      Current Outpatient Medications:    Alcohol Swabs (ALCOHOL PADS) 70 % PADS, Use as instructed to check blood sugars 1 time per day dx: e11.65, Disp: 150 each, Rfl: 2   amLODipine (NORVASC) 5 MG tablet, TAKE 1 TABLET BY MOUTH EVERY DAY, Disp: 90 tablet, Rfl: 1   aspirin EC 81 MG tablet, Take 81 mg by mouth daily., Disp: , Rfl:    Blood Glucose Calibration (TRUE METRIX LEVEL 1) Low SOLN, Use as directed dx: e11.65, Disp: 150 each, Rfl: 2   Blood Glucose Calibration (TRUE METRIX LEVEL 2) Normal SOLN, Use as directed dx: e11.65, Disp: 1 each, Rfl: 2   Blood Glucose Calibration (TRUE METRIX LEVEL 3) High SOLN, Use as directed dx: e11.65, Disp: 1 each, Rfl: 2   Blood Glucose Monitoring Suppl (TRUE METRIX METER) w/Device KIT, USE AS DIRECTED, Disp: 1 kit, Rfl: 1   diclofenac (VOLTAREN) 75 MG EC tablet, TAKE 1 TABLET BY MOUTH TWICE A DAY, Disp: 30 tablet, Rfl: 0   FARXIGA 10 MG TABS tablet, TAKE 1 TABLET BY MOUTH EVERY DAY IN THE MORNING, Disp: 90 tablet, Rfl: 1   lisinopril-hydrochlorothiazide (ZESTORETIC) 20-12.5 MG tablet, TAKE 1 TABLET EVERY DAY, Disp: 90 tablet, Rfl: 2   OZEMPIC, 1 MG/DOSE, 4 MG/3ML SOPN, INJECT 1 MG ONCE WEEKLY, Disp: 3 mL, Rfl: 3   Semaglutide, 2 MG/DOSE, (OZEMPIC, 2 MG/DOSE,) 8 MG/3ML  SOPN, Inject 2 mg into the skin once a week., Disp: 9 mL, Rfl: 1   simvastatin (ZOCOR) 40 MG tablet, TAKE 1 TABLET EVERY DAY, Disp: 90 tablet, Rfl: 1   TRUE METRIX BLOOD GLUCOSE TEST test strip, USE AS INSTRUCTED TO TEST BLOOD SUGAR 1 TIME PER DAY, Disp: 100 strip, Rfl: 2   TRUEplus Lancets 28G MISC, USE AS INSTRUCTED TO CHECK BLOOD SUGARS 1 TIME PER DAY, Disp: 100 each, Rfl: 3   No Known Allergies   The patient states she uses post menopausal status for birth control. Last LMP was No LMP  recorded. Patient is postmenopausal.. Negative for Dysmenorrhea. Negative for: breast discharge, breast lump(s), breast pain and breast self exam. Associated symptoms include abnormal vaginal bleeding. Pertinent negatives include abnormal bleeding (hematology), anxiety, decreased libido, depression, difficulty falling sleep, dyspareunia, history of infertility, nocturia, sexual dysfunction, sleep disturbances, urinary incontinence, urinary urgency, vaginal discharge and vaginal itching. Diet regular.The patient states her exercise level is  intermittent.   . The patient's tobacco use is:  Social History   Tobacco Use  Smoking Status Never  Smokeless Tobacco Never  Tobacco Comments   n/a  . She has been exposed to passive smoke. The patient's alcohol use is:  Social History   Substance and Sexual Activity  Alcohol Use Not Currently   Comment: occ   Review of Systems  Constitutional: Negative.   HENT: Negative.    Eyes: Negative.  Negative for blurred vision.  Respiratory: Negative.    Cardiovascular: Negative.   Gastrointestinal:  Positive for diarrhea.       States she ate fried chicken yesterday, had diarrhea in the middle of the night. States she ate ice cream yesterday evening.   Endocrine: Negative.   Genitourinary: Negative.   Musculoskeletal: Negative.   Skin: Negative.   Allergic/Immunologic: Negative.   Neurological: Negative.   Hematological: Negative.   Psychiatric/Behavioral: Negative.      Today's Vitals   10/30/21 0831  BP: 140/90  Pulse: 76  Temp: 98 F (36.7 C)  Weight: 182 lb 12.8 oz (82.9 kg)  Height: 5' 0.8" (1.544 m)  PainSc: 0-No pain   Body mass index is 34.77 kg/m.  Wt Readings from Last 3 Encounters:  10/30/21 182 lb 12.8 oz (82.9 kg)  10/11/21 181 lb 3.2 oz (82.2 kg)  06/12/21 185 lb 12.8 oz (84.3 kg)     Objective:  Physical Exam Vitals and nursing note reviewed.  Constitutional:      Appearance: Normal appearance. She is obese.  HENT:      Head: Normocephalic and atraumatic.     Right Ear: Tympanic membrane, ear canal and external ear normal.     Left Ear: Tympanic membrane, ear canal and external ear normal.     Nose:     Comments: Masked     Mouth/Throat:     Comments: Masked  Eyes:     Extraocular Movements: Extraocular movements intact.     Conjunctiva/sclera: Conjunctivae normal.     Pupils: Pupils are equal, round, and reactive to light.  Cardiovascular:     Rate and Rhythm: Normal rate and regular rhythm.     Pulses: Normal pulses.          Dorsalis pedis pulses are 2+ on the right side and 2+ on the left side.     Heart sounds: Normal heart sounds.  Pulmonary:     Effort: Pulmonary effort is normal.     Breath sounds: Normal breath sounds.  Chest:  Breasts:    Tanner Score is 5.     Right: Normal.     Left: Normal.  Abdominal:     General: Bowel sounds are normal.     Palpations: Abdomen is soft.  Genitourinary:    Comments: deferred Musculoskeletal:        General: Normal range of motion.     Cervical back: Normal range of motion and neck supple.  Feet:     Right foot:     Protective Sensation: 5 sites tested.  5 sites sensed.     Skin integrity: Skin integrity normal.     Toenail Condition: Right toenails are normal.     Left foot:     Protective Sensation: 5 sites tested.  5 sites sensed.     Skin integrity: Skin integrity normal.     Toenail Condition: Left toenails are normal.  Skin:    General: Skin is warm and dry.  Neurological:     General: No focal deficit present.     Mental Status: She is alert and oriented to person, place, and time.  Psychiatric:        Mood and Affect: Mood normal.        Behavior: Behavior normal.        Assessment And Plan:     1. Encounter for general adult medical examination w/o abnormal findings Comments: A full exam was performed. Importance of monthly self breast exams was discussed with the patient. PATIENT IS ADVISED TO GET 30-45 MINUTES  REGULAR EXERCISE NO LESS THAN FOUR TO FIVE DAYS PER WEEK - BOTH WEIGHTBEARING EXERCISES AND AEROBIC ARE RECOMMENDED.  PATIENT IS ADVISED TO FOLLOW A HEALTHY DIET WITH AT LEAST SIX FRUITS/VEGGIES PER DAY, DECREASE INTAKE OF RED MEAT, AND TO INCREASE FISH INTAKE TO TWO DAYS PER WEEK.  MEATS/FISH SHOULD NOT BE FRIED, BAKED OR BROILED IS PREFERABLE.  IT IS ALSO IMPORTANT TO CUT BACK ON YOUR SUGAR INTAKE. PLEASE AVOID ANYTHING WITH ADDED SUGAR, CORN SYRUP OR OTHER SWEETENERS. IF YOU MUST USE A SWEETENER, YOU CAN TRY STEVIA. IT IS ALSO IMPORTANT TO AVOID ARTIFICIALLY SWEETENERS AND DIET BEVERAGES. LASTLY, I SUGGEST WEARING SPF 50 SUNSCREEN ON EXPOSED PARTS AND ESPECIALLY WHEN IN THE DIRECT SUNLIGHT FOR AN EXTENDED PERIOD OF TIME.  PLEASE AVOID FAST FOOD RESTAURANTS AND INCREASE YOUR WATER INTAKE.  2. Type 2 diabetes mellitus with stage 2 chronic kidney disease, without long-term current use of insulin ([facility]) Comments: Diabetic foot exam was performed. She will rto in 4 months for re-evaluation. I will request her Dec 2022 diabetic eye exam. I DISCUSSED WITH THE PATIENT AT LENGTH REGARDING THE GOALS OF GLYCEMIC CONTROL AND POSSIBLE LONG-TERM COMPLICATIONS.  I  ALSO STRESSED THE IMPORTANCE OF COMPLIANCE WITH HOME GLUCOSE MONITORING, DIETARY RESTRICTIONS INCLUDING AVOIDANCE OF SUGARY DRINKS/PROCESSED FOODS,  ALONG WITH REGULAR EXERCISE.  I  ALSO STRESSED THE IMPORTANCE OF ANNUAL EYE EXAMS, SELF FOOT CARE AND COMPLIANCE WITH OFFICE VISITS.  - CBC - CMP14+EGFR - Lipid panel - Hemoglobin A1c  3. Hypertensive nephropathy Comments: Chronic, fair control. Goal BP <130/80. Repeat BP. EKG performed, NSR w/o acute changes. Advised to follow low sodium diet. Encouraged to limit her fried food intake.  - EKG 12-Lead - CMP14+EGFR  4. Diarrhea, unspecified type Comments: Pt advised she may be lactose intolerant, advised to try almond milk or A2 milk. Should limit intake of ice cream due to underlying DM. She is encouraged  to keep track of foods which trigger her sx and then do  an elimination diet.   5. Vitamin D deficiency disease Comments: I will check a vitamin D level and supplement as needed.  - Vitamin D (25 hydroxy)  6. Class 1 obesity due to excess calories with serious comorbidity and body mass index (BMI) of 34.0 to 34.9 in adult She is encouraged to initially strive for BMI less than 30 to decrease cardiac risk. She is advised to exercise no less than 150 minutes per week.   Patient was given opportunity to ask questions. Patient verbalized understanding of the plan and was able to repeat key elements of the plan. All questions were answered to their satisfaction.   I, [name], MD, have reviewed all documentation for this visit. The documentation on 10/30/21 for the exam, diagnosis, procedures, and orders are all accurate and complete.   THE PATIENT IS ENCOURAGED TO PRACTICE SOCIAL DISTANCING DUE TO THE COVID-19 PANDEMIC.

## 2021-10-30 NOTE — Patient Instructions (Addendum)
Avoid canola, vegetable and corn oils.  Cook with avocado oil Remove dairy from your diet, consider A2 milk or almond milks  Health Maintenance, Female Adopting a healthy lifestyle and getting preventive care are important in promoting health and wellness. Ask your health care provider about: The right schedule for you to have regular tests and exams. Things you can do on your own to prevent diseases and keep yourself healthy. What should I know about diet, weight, and exercise? Eat a healthy diet  Eat a diet that includes plenty of vegetables, fruits, low-fat dairy products, and lean protein. Do not eat a lot of foods that are high in solid fats, added sugars, or sodium. Maintain a healthy weight Body mass index (BMI) is used to identify weight problems. It estimates body fat based on height and weight. Your health care provider can help determine your BMI and help you achieve or maintain a healthy weight. Get regular exercise Get regular exercise. This is one of the most important things you can do for your health. Most adults should: Exercise for at least 150 minutes each week. The exercise should increase your heart rate and make you sweat (moderate-intensity exercise). Do strengthening exercises at least twice a week. This is in addition to the moderate-intensity exercise. Spend less time sitting. Even light physical activity can be beneficial. Watch cholesterol and blood lipids Have your blood tested for lipids and cholesterol at 67 years of age, then have this test every 5 years. Have your cholesterol levels checked more often if: Your lipid or cholesterol levels are high. You are older than 67 years of age. You are at high risk for heart disease. What should I know about cancer screening? Depending on your health history and family history, you may need to have cancer screening at various ages. This may include screening for: Breast cancer. Cervical cancer. Colorectal  cancer. Skin cancer. Lung cancer. What should I know about heart disease, diabetes, and high blood pressure? Blood pressure and heart disease High blood pressure causes heart disease and increases the risk of stroke. This is more likely to develop in people who have high blood pressure readings or are overweight. Have your blood pressure checked: Every 3-5 years if you are 29-85 years of age. Every year if you are 98 years old or older. Diabetes Have regular diabetes screenings. This checks your fasting blood sugar level. Have the screening done: Once every three years after age 31 if you are at a normal weight and have a low risk for diabetes. More often and at a younger age if you are overweight or have a high risk for diabetes. What should I know about preventing infection? Hepatitis B If you have a higher risk for hepatitis B, you should be screened for this virus. Talk with your health care provider to find out if you are at risk for hepatitis B infection. Hepatitis C Testing is recommended for: Everyone born from [birth year] through 1966. Anyone with known risk factors for hepatitis C. Sexually transmitted infections (STIs) Get screened for STIs, including gonorrhea and chlamydia, if: You are sexually active and are younger than 67 years of age. You are older than 67 years of age and your health care provider tells you that you are at risk for this type of infection. Your sexual activity has changed since you were last screened, and you are at increased risk for chlamydia or gonorrhea. Ask your health care provider if you are at risk. Ask your health care  provider about whether you are at high risk for HIV. Your health care provider may recommend a prescription medicine to help prevent HIV infection. If you choose to take medicine to prevent HIV, you should first get tested for HIV. You should then be tested every 3 months for as long as you are taking the medicine. Pregnancy If you are  about to stop having your period (premenopausal) and you may become pregnant, seek counseling before you get pregnant. Take 400 to 800 micrograms (mcg) of folic acid every day if you become pregnant. Ask for birth control (contraception) if you want to prevent pregnancy. Osteoporosis and menopause Osteoporosis is a disease in which the bones lose minerals and strength with aging. This can result in bone fractures. If you are 64 years old or older, or if you are at risk for osteoporosis and fractures, ask your health care provider if you should: Be screened for bone loss. Take a calcium or vitamin D supplement to lower your risk of fractures. Be given hormone replacement therapy (HRT) to treat symptoms of menopause. Follow these instructions at home: Alcohol use Do not drink alcohol if: Your health care provider tells you not to drink. You are pregnant, may be pregnant, or are planning to become pregnant. If you drink alcohol: Limit how much you have to: 0-1 drink a day. Know how much alcohol is in your drink. In the [place] one drink equals one 12 oz bottle of beer (355 mL), one 5 oz glass of wine (148 mL), or one 1 oz glass of hard liquor (44 mL). Lifestyle Do not use any products that contain nicotine or tobacco. These products include cigarettes, chewing tobacco, and vaping devices, such as e-cigarettes. If you need help quitting, ask your health care provider. Do not use street drugs. Do not share needles. Ask your health care provider for help if you need support or information about quitting drugs. General instructions Schedule regular health, dental, and eye exams. Stay current with your vaccines. Tell your health care provider if: You often feel depressed. You have ever been abused or do not feel safe at home. Summary Adopting a healthy lifestyle and getting preventive care are important in promoting health and wellness. Follow your health care provider's instructions about  healthy diet, exercising, and getting tested or screened for diseases. Follow your health care provider's instructions on monitoring your cholesterol and blood pressure. This information is not intended to replace advice given to you by your health care provider. Make sure you discuss any questions you have with your health care provider. Document Revised: 01/30/2021 Document Reviewed: 01/30/2021 Elsevier Patient Education  [place].

## 2021-10-31 ENCOUNTER — Other Ambulatory Visit: Payer: Self-pay

## 2021-10-31 LAB — CMP14+EGFR
ALT: 35 IU/L — ABNORMAL HIGH (ref 0–32)
AST: 22 IU/L (ref 0–40)
Albumin/Globulin Ratio: 1.7 (ref 1.2–2.2)
Albumin: 4.5 g/dL (ref 3.8–4.8)
Alkaline Phosphatase: 136 IU/L — ABNORMAL HIGH (ref 44–121)
BUN/Creatinine Ratio: 21 (ref 12–28)
BUN: 22 mg/dL (ref 8–27)
Bilirubin Total: 0.3 mg/dL (ref 0.0–1.2)
CO2: 22 mmol/L (ref 20–29)
Calcium: 9.4 mg/dL (ref 8.7–10.3)
Chloride: 101 mmol/L (ref 96–106)
Creatinine, Ser: 1.05 mg/dL — ABNORMAL HIGH (ref 0.57–1.00)
Globulin, Total: 2.6 g/dL (ref 1.5–4.5)
Glucose: 107 mg/dL — ABNORMAL HIGH (ref 70–99)
Potassium: 4.2 mmol/L (ref 3.5–5.2)
Sodium: 139 mmol/L (ref 134–144)
Total Protein: 7.1 g/dL (ref 6.0–8.5)
eGFR: 59 mL/min/{1.73_m2} — ABNORMAL LOW (ref >59)

## 2021-10-31 LAB — HEMOGLOBIN A1C
Est. average glucose Bld gHb Est-mCnc: 140 mg/dL
Hgb A1c MFr Bld: 6.5 % — ABNORMAL HIGH (ref 4.8–5.6)

## 2021-10-31 LAB — LIPID PANEL
Chol/HDL Ratio: 2.6 ratio (ref 0.0–4.4)
Cholesterol, Total: 184 mg/dL (ref 100–199)
HDL: 72 mg/dL (ref >39)
LDL Chol Calc (NIH): 95 mg/dL (ref 0–99)
Triglycerides: 93 mg/dL (ref 0–149)
VLDL Cholesterol Cal: 17 mg/dL (ref 5–40)

## 2021-10-31 LAB — CBC
Hematocrit: 43.8 % (ref 34.0–46.6)
Hemoglobin: 14.2 g/dL (ref 11.1–15.9)
MCH: 27.7 pg (ref 26.6–33.0)
MCHC: 32.4 g/dL (ref 31.5–35.7)
MCV: 86 fL (ref 79–97)
Platelets: 343 10*3/uL (ref 150–450)
RBC: 5.12 x10E6/uL (ref 3.77–5.28)
RDW: 13.2 % (ref 11.7–15.4)
WBC: 7.2 10*3/uL (ref 3.4–10.8)

## 2021-10-31 LAB — VITAMIN D 25 HYDROXY (VIT D DEFICIENCY, FRACTURES): Vit D, 25-Hydroxy: 50.2 ng/mL (ref 30.0–100.0)

## 2021-10-31 MED ORDER — ONETOUCH VERIO VI STRP: ORAL_STRIP | 3 refills | Status: DC

## 2021-10-31 MED ORDER — ONETOUCH DELICA PLUS LANCET33G MISC: 3 refills | Status: DC

## 2021-11-07 ENCOUNTER — Encounter: Payer: Self-pay | Admitting: Internal Medicine

## 2021-11-08 ENCOUNTER — Telehealth: Payer: Self-pay

## 2021-11-08 NOTE — Chronic Care Management (AMB) (Signed)
°  Monica Ryan was reminded to have all medications, supplements and any blood glucose and blood pressure readings available for review with Cherylin Mylar, Pharm. D, at her telephone visit on 11-15-2021 at 10:00.  Questions: Have you had any recent office visit or specialist visit outside of [facility]? Patient stated she went to a chiropractor in November.  Are there any concerns you would like to discuss during your office visit? Patient stated no  Are you having any problems obtaining your medications? (Whether it pharmacy issues or cost) Patient stated no  If patient has any PAP medications ask if they are having any problems getting their PAP medication or refill? Patient stated no  NOTES: Patient stated she tried to apply for farxiga herself but didn't qualify due to having 2 different types of insurance.  Care Gaps: AWV 10-18-2022  Star Rating Drug: Simvastatin 40 mg- Last filled 09-20-2021 90 [facility] Lisinopril-HCTZ 20-12.5 mg- Last filled 09-20-2021 [facility] Farxiga 10 mg- Last filled 10-11-2021 90 [facility] Ozempic 2 mg-  Patient Assistance  Any gaps in medications fill history? No  Huey Romans [facility] Clinical Pharmacist Assistant [phone]

## 2021-11-15 ENCOUNTER — Ambulatory Visit (INDEPENDENT_AMBULATORY_CARE_PROVIDER_SITE_OTHER): Payer: No Typology Code available for payment source

## 2021-11-15 DIAGNOSIS — I129 Hypertensive chronic kidney disease with stage 1 through stage 4 chronic kidney disease, or unspecified chronic kidney disease: Secondary | ICD-10-CM

## 2021-11-15 DIAGNOSIS — E782 Mixed hyperlipidemia: Secondary | ICD-10-CM

## 2021-11-15 NOTE — Progress Notes (Signed)
Chronic Care Management Pharmacy Note  11/15/2021 Name:  Monica Ryan MRN:  [id number] DOB:  01/04/1956  Summary: Patient reports that she is doing well.  Recommendations/Changes made from today's visit: Recommend patient increase the amount of water she is drinking. Recommend patients cholesterol mediation be changed to Atorvastatin 20 mg tablet daily.  Recommended a reminder system for taking her medication at the same time each day.   Plan: Monica Ryan is going to start drinking 64 ounces of water, by increasing the amount of water she eats during the day. Monica Ryan is going to use her smart watch to notify her of when to take her medication. Collaborate with PCP team to change patient to Atorvastatin 20 mg tablet daily.    Subjective: Monica Ryan is an 66 y.o. year old female who is a primary patient of [name], MD.  The CCM team was consulted for assistance with disease management and care coordination needs.    Engaged with patient by telephone for follow up visit in response to provider referral for pharmacy case management and/or care coordination services.   Consent to Services:  The patient was given information about Chronic Care Management services, agreed to services, and gave verbal consent prior to initiation of services.  Please see initial visit note for detailed documentation.   Patient Care Team: [name], MD as PCP - General (Internal Medicine)  Recent office visits: 10/30/2020 PCP OV   Recent consult visits: 09/14/2021  [facility] visits: None in previous 6 months   Objective:  Lab Results  Component Value Date   CREATININE 1.05 (H) 10/30/2021   BUN 22 10/30/2021   EGFR 59 (L) 10/30/2021   GFRNONAA 64 10/04/2020   GFRAA 74 10/04/2020   NA 139 10/30/2021   K 4.2 10/30/2021   CALCIUM 9.4 10/30/2021   CO2 22 10/30/2021   GLUCOSE 107 (H) 10/30/2021    Lab Results  Component Value Date/Time   HGBA1C 6.5 (H)  10/30/2021 09:12 AM   HGBA1C 6.6 (H) 06/12/2021 10:28 AM   MICROALBUR 10 10/11/2021 09:41 AM   MICROALBUR 10 10/04/2020 12:29 PM    Last diabetic Eye exam:  Lab Results  Component Value Date/Time   HMDIABEYEEXA No Retinopathy 09/14/2021 12:00 AM    Last diabetic Foot exam: No results found for: HMDIABFOOTEX   Lab Results  Component Value Date   CHOL 184 10/30/2021   HDL 72 10/30/2021   LDLCALC 95 10/30/2021   TRIG 93 10/30/2021   CHOLHDL 2.6 10/30/2021    Hepatic Function Latest Ref Rng & Units 10/30/2021 06/12/2021 10/04/2020  Total Protein 6.0 - 8.5 g/dL 7.1 7.5 7.5  Albumin 3.8 - 4.8 g/dL 4.5 4.6 4.4  AST 0 - 40 IU/L $Remov'22 19 19  'jvVgyC$ ALT 0 - 32 IU/L 35(H) 22 13  Alk Phosphatase 44 - 121 IU/L 136(H) 126(H) 121  Total Bilirubin 0.0 - 1.2 mg/dL 0.3 0.4 0.4    Lab Results  Component Value Date/Time   TSH 1.210 10/04/2020 02:20 PM    CBC Latest Ref Rng & Units 10/30/2021 10/04/2020 10/05/2019  WBC 3.4 - 10.8 x10E3/uL 7.2 7.3 8.5  Hemoglobin 11.1 - 15.9 g/dL 14.2 13.8 14.4  Hematocrit 34.0 - 46.6 % 43.8 42.5 42.4  Platelets 150 - 450 x10E3/uL 343 286 328    Lab Results  Component Value Date/Time   VD25OH 50.2 10/30/2021 09:12 AM   VD25OH 39.9 10/04/2020 02:20 PM    Clinical ASCVD: No  The 10-year ASCVD risk score (Arnett DK, et al., 2019) is: 39.1%   Values used to calculate the score:     Age: 3 years     Sex: Female     Is Non-Hispanic African American: Yes     Diabetic: Yes     Tobacco smoker: Yes     Systolic Blood Pressure: 606 mmHg     Is BP treated: Yes     HDL Cholesterol: 72 mg/dL     Total Cholesterol: 184 mg/dL    Depression screen [facility] 2/9 10/11/2021 10/04/2020 07/15/2019  Decreased Interest 0 0 0  Down, Depressed, Hopeless 0 0 0  PHQ - 2 Score 0 0 0      Social History   Tobacco Use  Smoking Status Never  Smokeless Tobacco Never  Tobacco Comments   n/a   BP Readings from Last 3 Encounters:  10/30/21 132/80  10/11/21 124/62  06/12/21 120/66    Pulse Readings from Last 3 Encounters:  10/30/21 76  10/11/21 79  06/12/21 78   Wt Readings from Last 3 Encounters:  10/30/21 182 lb 12.8 oz (82.9 kg)  10/11/21 181 lb 3.2 oz (82.2 kg)  06/12/21 185 lb 12.8 oz (84.3 kg)   BMI Readings from Last 3 Encounters:  10/30/21 34.77 kg/m  10/11/21 34.24 kg/m  06/12/21 35.57 kg/m    Assessment/Interventions: Review of patient past medical history, allergies, medications, health status, including review of consultants reports, laboratory and other test data, was performed as part of comprehensive evaluation and provision of chronic care management services.   SDOH:  (Social Determinants of Health) assessments and interventions performed: Yes  SDOH Screenings   Alcohol Screen: Not on file  Depression (PHQ2-9): Low Risk    PHQ-2 Score: 0  Financial Resource Strain: Low Risk    Difficulty of Paying Living Expenses: Not hard at all  Food Insecurity: No Food Insecurity   Worried About Charity fundraiser in the Last Year: Never true   Ran Out of Food in the Last Year: Never true  Housing: Not on file  Physical Activity: Insufficiently Active   Days of Exercise per Week: 3 days   Minutes of Exercise per Session: 30 min  Social Connections: Not on file  Stress: No Stress Concern Present   Feeling of Stress : Not at all  Tobacco Use: Low Risk    Smoking Tobacco Use: Never   Smokeless Tobacco Use: Never   Passive Exposure: Not on file  Transportation Needs: No Transportation Needs   Lack of Transportation (Medical): No   Lack of Transportation (Non-Medical): No    CCM Care Plan  No Known Allergies  Medications Reviewed Today     Reviewed by [name], MD (Physician) on 10/30/21 at 0847  Med List Status: <None>   Medication Order Taking? Sig Documenting Provider Last Dose Status Informant  Alcohol Swabs (ALCOHOL PADS) 70 % PADS [id number] Yes Use as instructed to check blood sugars 1 time per day dx: e11.65 [name], MD Taking Active   amLODipine (NORVASC) 5 MG tablet [id number] Yes TAKE 1 TABLET BY MOUTH EVERY DAY [name], MD Taking Active   aspirin EC 81 MG tablet [id number] Yes Take 81 mg by mouth daily. [provider] Taking Active   Blood Glucose Calibration (TRUE METRIX LEVEL 1) Low SOLN [id number] Yes Use as directed dx: e11.65 [name], MD Taking Active   Blood Glucose Calibration (TRUE METRIX LEVEL 2) Normal SOLN [id number] Yes Use as  directed dx: e11.65 [name], MD Taking Active   Blood Glucose Calibration (TRUE METRIX LEVEL 3) High SOLN [id number] Yes Use as directed dx: e11.65 [name], MD Taking Active   Blood Glucose Monitoring Suppl (TRUE METRIX METER) w/Device KIT [id number] Yes USE AS DIRECTED [name], MD Taking Active   diclofenac (VOLTAREN) 75 MG EC tablet [id number] Yes TAKE 1 TABLET BY MOUTH TWICE A DAY Stover, Xenia, DPM Taking Active   FARXIGA 10 MG TABS tablet [id number] Yes TAKE 1 TABLET BY MOUTH EVERY DAY IN THE Wende Neighbors, MD Taking Active   lisinopril-hydrochlorothiazide (ZESTORETIC) 20-12.5 MG tablet [id number] Yes TAKE 1 TABLET EVERY DAY [name], MD Taking Active   OZEMPIC, 1 MG/DOSE, 4 MG/3ML Bonney Aid [id number] Yes INJECT 1 MG ONCE WEEKLY [name], MD Taking Active   Semaglutide, 2 MG/DOSE, (OZEMPIC, 2 MG/DOSE,) 8 MG/3ML SOPN [id number] Yes Inject 2 mg into the skin once a week. Minette Brine, FNP Taking Active   simvastatin (ZOCOR) 40 MG tablet [id number] Yes TAKE 1 TABLET EVERY DAY [name], MD Taking Active   TRUE METRIX BLOOD GLUCOSE TEST test strip [id number] Yes USE AS INSTRUCTED TO TEST BLOOD SUGAR 1 TIME PER DAY [name], MD Taking Active   TRUEplus Lancets 28G [place] [id number] Yes USE AS INSTRUCTED TO CHECK BLOOD SUGARS 1 TIME PER DAY [name], MD Taking Active   Med List Note Levin Erp 07/24/12 1287): Kombiglyze 01/999 daily 4pm(diabetes)            Patient Active Problem  List   Diagnosis Date Noted   Diarrhea 10/30/2021   Type 2 diabetes mellitus with stage 2 chronic kidney disease, without long-term current use of insulin ([place]) 09/30/2018   Chronic renal disease, stage II 09/30/2018   Hypertensive nephropathy 09/30/2018   Left foot pain 09/30/2018    Immunization History  Administered Date(s) Administered   DTaP 02/06/2013   Fluad Quad(high Dose 65+) 10/04/2020   Influenza, High Dose Seasonal PF 06/07/2021   Influenza,inj,Quad PF,6+ Mos 07/15/2019   Influenza-Unspecified 06/17/2018   Moderna SARS-COV2 Booster Vaccination 07/26/2020   Moderna Sars-Covid-2 Vaccination 10/17/2019, 11/21/2019, 03/13/2021   PNEUMOCOCCAL CONJUGATE-20 06/07/2021   Pfizer Covid-19 Vaccine Bivalent Booster 59yrs & up 07/10/2021   Pneumococcal Conjugate-13 01/16/2020   Pneumococcal-Unspecified 04/20/2014   Td 07/10/2006   Zoster Recombinat (Shingrix) 04/23/2021, 06/30/2021    Conditions to be addressed/monitored:  Hypertension and Hyperlipidemia  Care Plan : [place]  Updates made by Mayford Knife, RPH since 11/15/2021 12:00 AM     Problem: HTN, HLD      Goal: Disease Management   This Visit's Progress: On track  Note:    Current Barriers:  Unable to independently monitor therapeutic efficacy  Pharmacist Clinical Goal(s):  Patient will achieve adherence to monitoring guidelines and medication adherence to achieve therapeutic efficacy through collaboration with PharmD and provider.   Interventions: 1:1 collaboration with [name], MD regarding development and update of comprehensive plan of care as evidenced by provider attestation and co-signature Inter-disciplinary care team collaboration (see longitudinal plan of care) Comprehensive medication review performed; medication list updated in electronic medical record  Hypertension (BP goal <130/80) -Not ideally controlled -Current treatment: Amlodipine 5 mg tablet daily Appropriate,  Effective, Safe, Accessible Lisinopril-HCTZ 20-12.5 mg tablet once per day Appropriate, Effective, Safe, Accessible -Current home readings: she is not checking her BP everyday but she has been checking, the readings are not recorded in a log book.  -Current dietary habits: she  reports eating pretty well. She prepares most of her meals at home. She cooks most of her meals in the oven. She only eats out once or twice per week. On Sundays and/ or Wednesdays. She goes to [employer] on Wednesday and she gets [employer] and fries.  -Current exercise habits: She is walking sometimes, but not as much as she would like because of Plantar Fascitis. She is going to see Dr. Cannon Kettle tomorrow.  -Denies hypotensive/hypertensive symptoms -Educated on Daily salt intake goal < 2300 mg; Exercise goal of 150 minutes per week; Importance of home blood pressure monitoring; Proper BP monitoring technique; -We discussed the different types of BP cuffs and she is going to use the arm cuff that he recently purchased.  -Counseled on taking her medication at the same time each day.  -Counseled to monitor BP at home twice per week, document, and provide log at future appointments -Recommended to continue current medication  Hyperlipidemia: (LDL goal < 70) -Uncontrolled -Current treatment: Simvastatin  40 mg tablet once per day Appropriate, Query effective,  -Current dietary patterns: patient is eating healthy most days, she has two special meals per week.  -Current exercise habits: please see hypertension -Educated on Cholesterol goals;  Benefits of statin for ASCVD risk reduction; Strategies to manage statin-induced myalgias; -Collaborate with PCP team to change patients medication regimen based on ASCVD risk. Recommend changing to Atorvastatin 20 mg tablet  and have labs completed in 6 weeks.   Patient Goals/Self-Care Activities Patient will:  - take medications as prescribed as evidenced by patient report and  record review  Follow Up Plan: The patient has been provided with contact information for the care management team and has been advised to call with any health related questions or concerns.       Medication Assistance: None required.  Patient affirms current coverage meets needs.  Compliance/Adherence/Medication fill history: Care Gaps: No care gaps   Star-Rating Drugs: Farxiga 5 mg  Ozempic $Remove'2mg'ayozhrO$ /dose  Lisinopril -hydrochlorothiazide 20-12.5 mg tablet   Patient's preferred pharmacy is:  [id number] [place], [place] [address] [place] [address] Phone: [phone] Fax: [phone]  [facility] [place] [place] [facility] [address] [place] [address] Phone: [phone] Fax: [phone]  [place] [address] [place] [address] Phone: [phone] Fax: [phone]  Uses pill box? Yes - she has a 7 day pill packaging AM/PM  Pt endorses 95% compliance  We discussed: Benefits of medication synchronization, packaging and delivery as well as enhanced pharmacist oversight with [employer]. Patient decided to: Continue current medication management strategy  Care Plan and Follow Up Patient Decision:  Patient agrees to Care Plan and Follow-up.  Plan: The patient has been provided with contact information for the care management team and has been advised to call with any health related questions or concerns.   [name], CPP, PharmD Clinical Pharmacist Practitioner [facility] [phone]

## 2021-11-15 NOTE — Patient Instructions (Signed)
Visit Information It was great speaking with you today!  Please let me know if you have any questions about our visit.   Goals Addressed             This Visit's Progress    Manage My Medicine       Timeframe:  Long-Range Goal Priority:  High Start Date:                             Expected End Date:                       Follow Up Date 04/25/2022  In Progress:   - call for medicine refill 2 or 3 days before it runs out - call if I am sick and can't take my medicine - keep a list of all the medicines I take; vitamins and herbals too - use a pillbox to sort medicine - use an alarm clock or phone to remind me to take my medicine     Why is this important?   These steps will help you keep on track with your medicines.           Patient Care Plan: CCM Pharmacy Care Plan     Problem Identified: HTN, HLD      Goal: Disease Management   This Visit's Progress: On track  Note:    Current Barriers:  Unable to independently monitor therapeutic efficacy  Pharmacist Clinical Goal(s):  Patient will achieve adherence to monitoring guidelines and medication adherence to achieve therapeutic efficacy through collaboration with PharmD and provider.   Interventions: 1:1 collaboration with Dorothyann Peng, MD regarding development and update of comprehensive plan of care as evidenced by provider attestation and co-signature Inter-disciplinary care team collaboration (see longitudinal plan of care) Comprehensive medication review performed; medication list updated in electronic medical record  Hypertension (BP goal <130/80) -Not ideally controlled -Current treatment: Amlodipine 5 mg tablet daily Appropriate, Effective, Safe, Accessible Lisinopril-HCTZ 20-12.5 mg tablet once per day Appropriate, Effective, Safe, Accessible -Current home readings: she is not checking her BP everyday but she has been checking, the readings are not recorded in a log book.  -Current dietary habits:  she reports eating pretty well. She prepares most of her meals at home. She cooks most of her meals in the oven. She only eats out once or twice per week. On Sundays and/ or Wednesdays. She goes to [employer] on Wednesday and she gets [employer] and fries.  -Current exercise habits: She is walking sometimes, but not as much as she would like because of Plantar Fascitis. She is going to see Dr. Marylene Land tomorrow.  -Denies hypotensive/hypertensive symptoms -Educated on Daily salt intake goal < 2300 mg; Exercise goal of 150 minutes per week; Importance of home blood pressure monitoring; Proper BP monitoring technique; -We discussed the different types of BP cuffs and she is going to use the arm cuff that he recently purchased.  -Counseled on taking her medication at the same time each day.  -Counseled to monitor BP at home twice per week, document, and provide log at future appointments -Recommended to continue current medication  Hyperlipidemia: (LDL goal < 70) -Uncontrolled -Current treatment: Simvastatin  40 mg tablet once per day Appropriate, Query effective,  -Current dietary patterns: patient is eating healthy most days, she has two special meals per week.  -Current exercise habits: please see hypertension -Educated on Cholesterol goals;  Benefits  of statin for ASCVD risk reduction; Strategies to manage statin-induced myalgias; -Collaborate with PCP team to change patients medication regimen based on ASCVD risk. Recommend changing to Atorvastatin 20 mg tablet  and have labs completed in 6 weeks.   Patient Goals/Self-Care Activities Patient will:  - take medications as prescribed as evidenced by patient report and record review  Follow Up Plan: The patient has been provided with contact information for the care management team and has been advised to call with any health related questions or concerns.       Patient agreed to services and verbal consent obtained.   The patient  verbalized understanding of instructions, educational materials, and care plan provided today and agreed to receive a mailed copy of patient instructions, educational materials, and care plan.   Monica Ryan, PharmD Clinical Pharmacist [facility] [phone]

## 2021-11-16 ENCOUNTER — Other Ambulatory Visit: Payer: Self-pay

## 2021-11-16 ENCOUNTER — Ambulatory Visit (INDEPENDENT_AMBULATORY_CARE_PROVIDER_SITE_OTHER): Payer: No Typology Code available for payment source | Admitting: Sports Medicine

## 2021-11-16 ENCOUNTER — Encounter: Payer: Self-pay | Admitting: Sports Medicine

## 2021-11-16 ENCOUNTER — Telehealth: Payer: Self-pay

## 2021-11-16 ENCOUNTER — Ambulatory Visit: Payer: No Typology Code available for payment source

## 2021-11-16 DIAGNOSIS — N182 Chronic kidney disease, stage 2 (mild): Secondary | ICD-10-CM | POA: Diagnosis not present

## 2021-11-16 DIAGNOSIS — M79671 Pain in right foot: Secondary | ICD-10-CM

## 2021-11-16 DIAGNOSIS — M722 Plantar fascial fibromatosis: Secondary | ICD-10-CM | POA: Diagnosis not present

## 2021-11-16 DIAGNOSIS — M79672 Pain in left foot: Secondary | ICD-10-CM

## 2021-11-16 DIAGNOSIS — E1122 Type 2 diabetes mellitus with diabetic chronic kidney disease: Secondary | ICD-10-CM

## 2021-11-16 DIAGNOSIS — M2142 Flat foot [pes planus] (acquired), left foot: Secondary | ICD-10-CM | POA: Diagnosis not present

## 2021-11-16 DIAGNOSIS — M2141 Flat foot [pes planus] (acquired), right foot: Secondary | ICD-10-CM | POA: Diagnosis not present

## 2021-11-16 DIAGNOSIS — E1142 Type 2 diabetes mellitus with diabetic polyneuropathy: Secondary | ICD-10-CM

## 2021-11-16 NOTE — Telephone Encounter (Signed)
Casts sent to central fab - HOLD FOR CMN °

## 2021-11-16 NOTE — Progress Notes (Signed)
Subjective: Monica Ryan is a 66 y.o. female returns to office for follow up evaluation foot pain.  Patient reports that the pain at the bottom of the left foot seems to be getting better not as intense as before states that when she was taking the anti-inflammatory medicine it really helped.  Patient reports that she could not tolerate wearing the night splint and would like to return it.  Patient reports that she is also here today for measurement for shoes and insoles but over-the-counter she did buy a arch support strap that seems to help some and has been using a compression sock that gives her support through her heel and arch that seems to be helping.  Patient denies any other pedal complaints at this time.  Fasting blood sugar 132 today last A1c 6.5 and last visit to PCP Dr. Baird Cancer was on 10/30/2021.  Patient Active Problem List   Diagnosis Date Noted   Diarrhea 10/30/2021   Type 2 diabetes mellitus with stage 2 chronic kidney disease, without long-term current use of insulin ([place]) 09/30/2018   Chronic renal disease, stage II 09/30/2018   Hypertensive nephropathy 09/30/2018   Left foot pain 09/30/2018    Current Outpatient Medications on File Prior to Visit  Medication Sig Dispense Refill   Alcohol Swabs (ALCOHOL PADS) 70 % PADS Use as instructed to check blood sugars 1 time per day dx: e11.65 150 each 2   amLODipine (NORVASC) 5 MG tablet TAKE 1 TABLET BY MOUTH EVERY DAY 90 tablet 1   aspirin EC 81 MG tablet Take 81 mg by mouth daily.     Blood Glucose Monitoring Suppl (ONETOUCH VERIO REFLECT) w/Device KIT Check blood sugars once daily E11.69 1 kit 1   diclofenac (VOLTAREN) 75 MG EC tablet TAKE 1 TABLET BY MOUTH TWICE A DAY 30 tablet 0   FARXIGA 10 MG TABS tablet TAKE 1 TABLET BY MOUTH EVERY DAY IN THE MORNING 90 tablet 1   glucose blood (ONETOUCH VERIO) test strip Check blood sugars once daily E11.69 100 each 3   Lancets (ONETOUCH DELICA PLUS HERDEY81K) MISC Check blood sugars once  daily E11.69 100 each 3   lisinopril-hydrochlorothiazide (ZESTORETIC) 20-12.5 MG tablet TAKE 1 TABLET EVERY DAY 90 tablet 2   OZEMPIC, 1 MG/DOSE, 4 MG/3ML SOPN INJECT 1 MG ONCE WEEKLY 3 mL 3   OZEMPIC, 2 MG/DOSE, 8 MG/3ML SOPN INJECT 2 MG INTO THE SKIN ONCE A WEEK. 9 mL 2   simvastatin (ZOCOR) 40 MG tablet TAKE 1 TABLET EVERY DAY 90 tablet 1   No current facility-administered medications on file prior to visit.    No Known Allergies  Objective:   General:  Alert and oriented x 3, in no acute distress  Dermatology: Skin is warm, dry, and supple bilateral. Nails are within normal limits. There is no lower extremity erythema, no eccymosis, no open lesions present bilateral.   Vascular: Dorsalis Pedis and Posterior Tibial pedal pulses are 2/4 bilateral. + hair growth noted bilateral. Capillary Fill Time is 3 seconds in all digits. No varicosities, No edema bilateral lower extremities.   Neurological: Gross sensation present via light touch bilateral.    Musculoskeletal: There is decreased tenderness to palpation at the medial calcaneal tubercale and through the insertion of the plantar fascia on the Left and right foot.  However subjectively patient states that the area that hurts her is a little bit on the plantar surface on the left.  Pes planus foot type.  Assessment and Plan: Problem  List Items Addressed This Visit       Endocrine   Type 2 diabetes mellitus with stage 2 chronic kidney disease, without long-term current use of insulin ([place])   Other Visit Diagnoses     Plantar fasciitis    -  Primary   Pes planus of both feet       Foot pain, bilateral           -Complete examination performed.  -Discussed with patient in detail the condition of plantar fasciitis, how this  occurs related to the foot type of the patient and general treatment options. - Patient declined injection today and reports that it does not hurt her that bad -Refilled diclofenac for patient to take as  needed -Patient to continue with over-the-counter arch support braces and compression sleeves until she is fitted with diabetic shoes and insoles -Advised patient to try icing each evening as directed -Continue with stretching ankle supportive shoes daily until she can be measured for her diabetic shoes and insoles, patient has an appointment with Aaron Edelman today -Discussed long term care and reocurrence; will closely monitor; if fails to improve will consider other treatment modalities.  -Patient to return to office pick up of diabetic shoes or insoles when called. -Patient could not tolerate night splint was dispensed last visit so she has returned night splint from last encounter.  Reports that she cannot tolerate using it.  We will remove charge from last office visit.  Landis Martins, DPM

## 2021-11-16 NOTE — Progress Notes (Signed)
SITUATION Reason for Consult: Evaluation for Prefabricated Diabetic Shoes and Custom Diabetic Inserts. Patient / Caregiver Report: Patient would like well fitting shoes  OBJECTIVE DATA: Patient History / Diagnosis:    ICD-10-CM   1. Type 2 diabetes mellitus with stage 2 chronic kidney disease, without long-term current use of insulin ([facility])  E11.22    N18.2     2. Pes planus of both feet  M21.41    M21.42       Current or Previous Devices:   None and no history  In-Person Foot Examination: Ulcers & Callousing:   None  Deformities:   - Hallux valgus  - Pes Planus   Shoe Size: 7.5W  ORTHOTIC RECOMMENDATION Recommended Devices: - 1x pair prefabricated PDAC approved diabetic shoes; Patient Selected - Orthofeet Boyd Size 7.5W - 3x pair custom-to-patient PDAC approved vacuum formed diabetic insoles.  GOALS OF SHOES AND INSOLES - Reduce shear and pressure - Reduce / Prevent callus formation - Reduce / Prevent ulceration - Protect the fragile healing compromised diabetic foot.  Patient would benefit from diabetic shoes and inserts as patient has diabetes mellitus and the patient has one or more of the following conditions: - History of partial or complete amputation of the foot - History of previous foot ulceration. - History of pre-ulcerative callus - Peripheral neuropathy with evidence of callus formation - Foot deformity - Poor circulation  ACTIONS PERFORMED Patient was casted for insoles via crush box and measured for shoes via brannock device. Procedure was explained and patient tolerated procedure well. All questions were answered and concerns addressed.  PLAN Patient is to be contacted and scheduled for fitting once CMN is obtained from treaing physician and shoes and insoles have been fabricated and received.

## 2021-11-21 DIAGNOSIS — E782 Mixed hyperlipidemia: Secondary | ICD-10-CM

## 2021-11-21 DIAGNOSIS — I129 Hypertensive chronic kidney disease with stage 1 through stage 4 chronic kidney disease, or unspecified chronic kidney disease: Secondary | ICD-10-CM

## 2021-12-20 DIAGNOSIS — K1370 Unspecified lesions of oral mucosa: Secondary | ICD-10-CM | POA: Diagnosis not present

## 2022-01-03 DIAGNOSIS — Z0100 Encounter for examination of eyes and vision without abnormal findings: Secondary | ICD-10-CM | POA: Diagnosis not present

## 2022-01-04 ENCOUNTER — Encounter: Payer: Self-pay | Admitting: Internal Medicine

## 2022-01-05 ENCOUNTER — Other Ambulatory Visit: Payer: Self-pay | Admitting: Internal Medicine

## 2022-01-21 ENCOUNTER — Other Ambulatory Visit: Payer: Self-pay | Admitting: Sports Medicine

## 2022-01-21 ENCOUNTER — Other Ambulatory Visit: Payer: Self-pay | Admitting: Internal Medicine

## 2022-01-22 NOTE — Telephone Encounter (Signed)
Please advise 

## 2022-01-29 ENCOUNTER — Encounter: Payer: Self-pay | Admitting: Internal Medicine

## 2022-01-29 ENCOUNTER — Ambulatory Visit (INDEPENDENT_AMBULATORY_CARE_PROVIDER_SITE_OTHER): Payer: No Typology Code available for payment source | Admitting: Internal Medicine

## 2022-01-29 VITALS — BP 112/72 | HR 68 | Temp 97.8°F | Ht 60.8 in | Wt 184.8 lb

## 2022-01-29 DIAGNOSIS — Z723 Lack of physical exercise: Secondary | ICD-10-CM

## 2022-01-29 DIAGNOSIS — E7800 Pure hypercholesterolemia, unspecified: Secondary | ICD-10-CM | POA: Diagnosis not present

## 2022-01-29 DIAGNOSIS — M722 Plantar fascial fibromatosis: Secondary | ICD-10-CM | POA: Insufficient documentation

## 2022-01-29 DIAGNOSIS — E1122 Type 2 diabetes mellitus with diabetic chronic kidney disease: Secondary | ICD-10-CM

## 2022-01-29 DIAGNOSIS — Z6835 Body mass index (BMI) 35.0-35.9, adult: Secondary | ICD-10-CM

## 2022-01-29 DIAGNOSIS — I129 Hypertensive chronic kidney disease with stage 1 through stage 4 chronic kidney disease, or unspecified chronic kidney disease: Secondary | ICD-10-CM | POA: Diagnosis not present

## 2022-01-29 DIAGNOSIS — N182 Chronic kidney disease, stage 2 (mild): Secondary | ICD-10-CM | POA: Diagnosis not present

## 2022-01-29 DIAGNOSIS — E66812 Obesity, class 2: Secondary | ICD-10-CM

## 2022-01-29 MED ORDER — ATORVASTATIN CALCIUM 20 MG PO TABS: 20.0000 mg | ORAL_TABLET | Freq: Every day | ORAL | 3 refills | Status: DC

## 2022-01-29 MED ORDER — SIMVASTATIN 40 MG PO TABS: 40.0000 mg | ORAL_TABLET | Freq: Every day | ORAL | 1 refills | Status: DC

## 2022-01-29 MED ORDER — LISINOPRIL-HYDROCHLOROTHIAZIDE 20-12.5 MG PO TABS: 1.0000 | ORAL_TABLET | Freq: Every day | ORAL | 2 refills | Status: DC

## 2022-01-29 NOTE — Patient Instructions (Signed)
Hypertension, Adult ?Hypertension is another name for high blood pressure. High blood pressure forces your heart to work harder to pump blood. This can cause problems over time. ?There are two numbers in a blood pressure reading. There is a top number (systolic) over a bottom number (diastolic). It is best to have a blood pressure that is below 120/80. ?What are the causes? ?The cause of this condition is not known. Some other conditions can lead to high blood pressure. ?What increases the risk? ?Some lifestyle factors can make you more likely to develop high blood pressure: ?Smoking. ?Not getting enough exercise or physical activity. ?Being overweight. ?Having too much fat, sugar, calories, or salt (sodium) in your diet. ?Drinking too much alcohol. ?Other risk factors include: ?Having any of these conditions: ?Heart disease. ?Diabetes. ?High cholesterol. ?Kidney disease. ?Obstructive sleep apnea. ?Having a family history of high blood pressure and high cholesterol. ?Age. The risk increases with age. ?Stress. ?What are the signs or symptoms? ?High blood pressure may not cause symptoms. Very high blood pressure (hypertensive crisis) may cause: ?Headache. ?Fast or uneven heartbeats (palpitations). ?Shortness of breath. ?Nosebleed. ?Vomiting or feeling like you may vomit (nauseous). ?Changes in how you see. ?Very bad chest pain. ?Feeling dizzy. ?Seizures. ?How is this treated? ?This condition is treated by making healthy lifestyle changes, such as: ?Eating healthy foods. ?Exercising more. ?Drinking less alcohol. ?Your doctor may prescribe medicine if lifestyle changes do not help enough and if: ?Your top number is above 130. ?Your bottom number is above 80. ?Your personal target blood pressure may vary. ?Follow these instructions at home: ?Eating and drinking ? ?If told, follow the DASH eating plan. To follow this plan: ?Fill one half of your plate at each meal with fruits and vegetables. ?Fill one fourth of your plate  at each meal with whole grains. Whole grains include whole-wheat pasta, brown rice, and whole-grain bread. ?Eat or drink low-fat dairy products, such as skim milk or low-fat yogurt. ?Fill one fourth of your plate at each meal with low-fat (lean) proteins. Low-fat proteins include fish, chicken without skin, eggs, beans, and tofu. ?Avoid fatty meat, cured and processed meat, or chicken with skin. ?Avoid pre-made or processed food. ?Limit the amount of salt in your diet to less than 1,500 mg each day. ?Do not drink alcohol if: ?Your doctor tells you not to drink. ?You are pregnant, may be pregnant, or are planning to become pregnant. ?If you drink alcohol: ?Limit how much you have to: ?0-1 drink a day for women. ?0-2 drinks a day for men. ?Know how much alcohol is in your drink. In the [place] one drink equals one 12 oz bottle of beer (355 mL), one 5 oz glass of wine (148 mL), or one 1? oz glass of hard liquor (44 mL). ?Lifestyle ? ?Work with your doctor to stay at a healthy weight or to lose weight. Ask your doctor what the best weight is for you. ?Get at least 30 minutes of exercise that causes your heart to beat faster (aerobic exercise) most days of the week. This may include walking, swimming, or biking. ?Get at least 30 minutes of exercise that strengthens your muscles (resistance exercise) at least 3 days a week. This may include lifting weights or doing Pilates. ?Do not smoke or use any products that contain nicotine or tobacco. If you need help quitting, ask your doctor. ?Check your blood pressure at home as told by your doctor. ?Keep all follow-up visits. ?Medicines ?Take over-the-counter and prescription medicines   only as told by your doctor. Follow directions carefully. ?Do not skip doses of blood pressure medicine. The medicine does not work as well if you skip doses. Skipping doses also puts you at risk for problems. ?Ask your doctor about side effects or reactions to medicines that you should watch  for. ?Contact a doctor if: ?You think you are having a reaction to the medicine you are taking. ?You have headaches that keep coming back. ?You feel dizzy. ?You have swelling in your ankles. ?You have trouble with your vision. ?Get help right away if: ?You get a very bad headache. ?You start to feel mixed up (confused). ?You feel weak or numb. ?You feel faint. ?You have very bad pain in your: ?Chest. ?Belly (abdomen). ?You vomit more than once. ?You have trouble breathing. ?These symptoms may be an emergency. Get help right away. Call 911. ?Do not wait to see if the symptoms will go away. ?Do not drive yourself to the hospital. ?Summary ?Hypertension is another name for high blood pressure. ?High blood pressure forces your heart to work harder to pump blood. ?For most people, a normal blood pressure is less than 120/80. ?Making healthy choices can help lower blood pressure. If your blood pressure does not get lower with healthy choices, you may need to take medicine. ?This information is not intended to replace advice given to you by your health care provider. Make sure you discuss any questions you have with your health care provider. ?Document Revised: 06/29/2021 Document Reviewed: 06/29/2021 ?Elsevier Patient Education ? 2023 Elsevier Inc. ? ?

## 2022-01-29 NOTE — Progress Notes (Signed)
?Rich Brave Llittleton,acting as a Education administrator for [name], MD.,have documented all relevant documentation on the behalf of [name], MD,as directed by  [name], MD while in the presence of [name], MD.  ?This visit occurred during the SARS-CoV-2 public health emergency.  Safety protocols were in place, including screening questions prior to the visit, additional usage of staff PPE, and extensive cleaning of exam room while observing appropriate contact time as indicated for disinfecting solutions. ? ?Subjective:  ?  ? Patient ID: Monica Ryan , female    DOB: [birth year] , 66 y.o.   MRN: [id number] ? ? ?Chief Complaint  ?Patient presents with  ? Diabetes  ? Hypertension  ? ? ?HPI ? ?She is here today for diabetes/BP check. She reports compliance with meds. Patient doesn't have any concerns or questions today. She does mention that she is waiting on her diabetic shoes so she can exercise more.  ? ?Diabetes ?She presents for her follow-up diabetic visit. She has type 2 diabetes mellitus. Her disease course has been stable. There are no hypoglycemic associated symptoms. Pertinent negatives for diabetes include no blurred vision, no polydipsia, no polyphagia and no polyuria. There are no hypoglycemic complications. Risk factors for coronary artery disease include diabetes mellitus, dyslipidemia, hypertension and post-menopausal. She is following a diabetic diet. She participates in exercise three times a week. Her breakfast blood glucose is taken between 7-8 am. Her breakfast blood glucose range is generally 90-110 mg/dl. An ACE inhibitor/angiotensin II receptor blocker is being taken. Eye exam is current.  ?Hypertension ?This is a chronic problem. The current episode started more than 1 year ago. The problem is controlled. Pertinent negatives include no blurred vision. Risk factors for coronary artery disease include diabetes mellitus, dyslipidemia and obesity. The current treatment provides  moderate improvement.   ? ?Past Medical History:  ?Diagnosis Date  ? Diabetes mellitus without complication ([place])   ? Hyperlipemia   ? Hypertension   ?  ? ?Family History  ?Problem Relation Age of Onset  ? Diabetes Mother   ? Alzheimer's disease Father   ? Diabetes Sister   ? Diabetes Brother   ? ? ? ?Current Outpatient Medications:  ?  Alcohol Swabs (ALCOHOL PADS) 70 % PADS, Use as instructed to check blood sugars 1 time per day dx: e11.65, Disp: 150 each, Rfl: 2 ?  amLODipine (NORVASC) 5 MG tablet, TAKE 1 TABLET BY MOUTH EVERY DAY, Disp: 90 tablet, Rfl: 1 ?  aspirin EC 81 MG tablet, Take 81 mg by mouth daily., Disp: , Rfl:  ?  atorvastatin (LIPITOR) 20 MG tablet, Take 1 tablet (20 mg total) by mouth daily., Disp: 90 tablet, Rfl: 3 ?  Blood Glucose Monitoring Suppl (ONETOUCH VERIO REFLECT) w/Device KIT, Check blood sugars once daily E11.69, Disp: 1 kit, Rfl: 1 ?  FARXIGA 10 MG TABS tablet, TAKE 1 TABLET BY MOUTH EVERY DAY IN THE MORNING, Disp: 90 tablet, Rfl: 1 ?  glucose blood (ONETOUCH VERIO) test strip, Check blood sugars once daily E11.69, Disp: 100 each, Rfl: 3 ?  Lancets (ONETOUCH DELICA PLUS [id number]) MISC, Check blood sugars once daily E11.69, Disp: 100 each, Rfl: 3 ?  OZEMPIC, 2 MG/DOSE, 8 MG/3ML SOPN, INJECT 2 MG INTO THE SKIN ONCE A WEEK., Disp: 9 mL, Rfl: 2 ?  diclofenac (VOLTAREN) 75 MG EC tablet, TAKE 1 TABLET BY MOUTH TWICE A DAY (Patient not taking: Reported on 01/29/2022), Disp: 30 tablet, Rfl: 0 ?  lisinopril-hydrochlorothiazide (ZESTORETIC) 20-12.5  MG tablet, Take 1 tablet by mouth daily., Disp: 90 tablet, Rfl: 2  ? ?No Known Allergies  ? ?Review of Systems  ?Constitutional: Negative.   ?Eyes:  Negative for blurred vision.  ?Respiratory: Negative.    ?Cardiovascular: Negative.   ?Endocrine: Negative for polydipsia, polyphagia and polyuria.  ?Neurological: Negative.   ?Psychiatric/Behavioral: Negative.     ? ?Today's Vitals  ? 01/29/22 2878  ?BP: 112/72  ?Pulse: 68  ?Temp: 97.8 ?F (36.6 ?C)   ?Weight: 184 lb 12.8 oz (83.8 kg)  ?Height: 5' 0.8" (1.544 m)  ?PainSc: 0-No pain  ? ?Body mass index is 35.15 kg/m?.  ?Wt Readings from Last 3 Encounters:  ?01/29/22 184 lb 12.8 oz (83.8 kg)  ?10/30/21 182 lb 12.8 oz (82.9 kg)  ?10/11/21 181 lb 3.2 oz (82.2 kg)  ?  ? ?Objective:  ?Physical Exam ?Vitals and nursing note reviewed.  ?Constitutional:   ?   Appearance: Normal appearance.  ?HENT:  ?   Head: Normocephalic and atraumatic.  ?Eyes:  ?   Extraocular Movements: Extraocular movements intact.  ?Cardiovascular:  ?   Rate and Rhythm: Normal rate and regular rhythm.  ?   Heart sounds: Normal heart sounds.  ?Pulmonary:  ?   Effort: Pulmonary effort is normal.  ?   Breath sounds: Normal breath sounds.  ?Musculoskeletal:  ?   Cervical back: Normal range of motion.  ?Skin: ?   General: Skin is warm.  ?Neurological:  ?   General: No focal deficit present.  ?   Mental Status: She is alert.  ?Psychiatric:     ?   Mood and Affect: Mood normal.     ?   Behavior: Behavior normal.  ?   ?Assessment And Plan:  ?   ?1. Type 2 diabetes mellitus with stage 2 chronic kidney disease, without long-term current use of insulin ([place]) ?Comments: Chronic, I will check labs as listed below. She will c/w Ozempic 46m once weekly.  ?- Hemoglobin A1c ?- Amb Referral To Provider Referral Exercise Program (P.R.E.P) ? ?2. Hypertensive nephropathy ?Comments: Chronic, well controlled. She is encouraged to follow low sodium diet.  ?- CMP14+EGFR ?- Amb Referral To Provider Referral Exercise Program (P.R.E.P) ? ?3. Pure hypercholesterolemia ?Comments: Last LDL 95 in Feb 2023. I will switch her to atorvastatin 225mdaily, f/u in 8 weeks for chol check. ? ?4. Plantar fasciitis, bilateral ?Comments: She is followed by Podiatry. She is waiting for diabetic shoes to come in. I will call Dr. StLeeanne Rioffice to get form faxed over.  ? ?5. Inactivity ?Comments: Partly due to plantar fasciitis. She does agree to PRCitigroupeferral.  ?- Amb Referral To Provider  Referral Exercise Program (P.R.E.P) ? ?6. Class 2 severe obesity due to excess calories with serious comorbidity and body mass index (BMI) of 35.0 to 35.9 in adult [facility]?Comments: She agrees to PREP referral.  ?- Amb Referral To Provider Referral Exercise Program (P.R.E.P) ?  ?Patient was given opportunity to ask questions. Patient verbalized understanding of the plan and was able to repeat key elements of the plan. All questions were answered to their satisfaction.  ? ?I, RoMaximino GreenlandMD, have reviewed all documentation for this visit. The documentation on 01/29/22 for the exam, diagnosis, procedures, and orders are all accurate and complete.  ? ?IF YOU HAVE BEEN REFERRED TO A SPECIALIST, IT MAY TAKE 1-2 WEEKS TO SCHEDULE/PROCESS THE REFERRAL. IF YOU HAVE NOT HEARD FROM US/SPECIALIST IN TWO WEEKS, PLEASE GIVE [place] CALL AT [phone]  X [phone].  ? ?THE PATIENT IS ENCOURAGED TO PRACTICE SOCIAL DISTANCING DUE TO THE COVID-19 PANDEMIC.   ?

## 2022-01-30 ENCOUNTER — Encounter: Payer: Self-pay | Admitting: Internal Medicine

## 2022-01-30 LAB — CMP14+EGFR
ALT: 22 IU/L (ref 0–32)
AST: 21 IU/L (ref 0–40)
Albumin/Globulin Ratio: 1.5 (ref 1.2–2.2)
Albumin: 4.4 g/dL (ref 3.8–4.8)
Alkaline Phosphatase: 130 IU/L — ABNORMAL HIGH (ref 44–121)
BUN/Creatinine Ratio: 12 (ref 12–28)
BUN: 13 mg/dL (ref 8–27)
Bilirubin Total: 0.3 mg/dL (ref 0.0–1.2)
CO2: 21 mmol/L (ref 20–29)
Calcium: 9.3 mg/dL (ref 8.7–10.3)
Chloride: 102 mmol/L (ref 96–106)
Creatinine, Ser: 1.09 mg/dL — ABNORMAL HIGH (ref 0.57–1.00)
Globulin, Total: 2.9 g/dL (ref 1.5–4.5)
Glucose: 112 mg/dL — ABNORMAL HIGH (ref 70–99)
Potassium: 4.2 mmol/L (ref 3.5–5.2)
Sodium: 140 mmol/L (ref 134–144)
Total Protein: 7.3 g/dL (ref 6.0–8.5)
eGFR: 56 mL/min/{1.73_m2} — ABNORMAL LOW (ref >59)

## 2022-01-30 LAB — HEMOGLOBIN A1C
Est. average glucose Bld gHb Est-mCnc: 137 mg/dL
Hgb A1c MFr Bld: 6.4 % — ABNORMAL HIGH (ref 4.8–5.6)

## 2022-01-31 ENCOUNTER — Telehealth: Payer: Self-pay

## 2022-01-31 NOTE — Telephone Encounter (Signed)
Called to discuss PREP program referral; prefers next class at [place], will have Pam RN [facility] contact her with June 5th class information.  ?

## 2022-02-01 ENCOUNTER — Encounter: Payer: Self-pay | Admitting: Internal Medicine

## 2022-02-05 ENCOUNTER — Telehealth: Payer: Self-pay

## 2022-02-05 NOTE — Telephone Encounter (Signed)
Casts released from fabrication hold ? ?Shoes ordered - [employer] [address] [place] ?

## 2022-02-08 ENCOUNTER — Encounter: Payer: Self-pay | Admitting: Internal Medicine

## 2022-02-08 ENCOUNTER — Other Ambulatory Visit: Payer: Self-pay

## 2022-02-08 MED ORDER — ONETOUCH DELICA PLUS LANCET33G MISC: 3 refills | Status: DC

## 2022-02-08 MED ORDER — LISINOPRIL-HYDROCHLOROTHIAZIDE 20-12.5 MG PO TABS: 1.0000 | ORAL_TABLET | Freq: Every day | ORAL | 2 refills | Status: DC

## 2022-02-08 MED ORDER — ONETOUCH VERIO VI STRP: ORAL_STRIP | 3 refills | Status: DC

## 2022-02-09 ENCOUNTER — Telehealth: Payer: Self-pay

## 2022-02-09 NOTE — Telephone Encounter (Signed)
Call to pt reference next PREP class starting on 02/26/22 M/W [id number] At [facility] Confirmed schedule. Intake 02/21/22 at 1015am scheduled. Will meet pt in lobby

## 2022-02-21 NOTE — Progress Notes (Deleted)
[facility] PREP Evaluation  Patient Details  Name: Monica Ryan MRN: [id number] Date of Birth: 1956-01-08 Age: 66 y.o. PCP: [name], MD  Vitals:   02/21/22 1015  BP: 122/70  Pulse: 74  Weight: 189 lb (85.7 kg)     [facility] Eval - 02/21/22 1500       [facility] "PREP" Location   [facility] "PREP" Location [facility]      Referral    Referring Provider [facility]    Reason for referral Diabetes;Obesitity/Overweight;Inactivity      Measurement   Waist Circumference 40.5 inches    Hip Circumference 45 inches    Body fat 44.9 percent      Information for Trainer   Goals Wt Loss goal to 150, 20-24 lbs in 12 wks    Current Exercise intermittent walks, yard work    Orthopedic Concerns Plantar fasciitis, Neck pain, Rotator cuff surgery right side    Pertinent Medical History DM2, CKD, inc chol    Current Barriers none    Medications that affect exercise Medication causing dizziness/drowsiness      Timed Up and Go (TUGS)   Timed Up and Go Low risk <9 seconds      Mobility and Daily Activities   I find it easy to walk up or down two or more flights of stairs. 3    I have no trouble taking out the trash. 4    I do housework such as vacuuming and dusting on my own without difficulty. 4    I can easily lift a gallon of milk (8lbs). 4    I can easily walk a mile. 2    I have no trouble reaching into high cupboards or reaching down to pick up something from the floor. 4    I do not have trouble doing out-door work such as Armed forces logistics/support/administrative officer, raking leaves, or gardening. 4      Mobility and Daily Activities   I feel independent. 4    I feel energetic. 3    I live an active life.  4    I feel strong. 3    I feel healthy. 3    I feel active as other people my age. 4      How fit and strong are you.   Fit and Strong Total Score 46            Past Medical History:  Diagnosis Date   Diabetes mellitus without complication ([place])    Hyperlipemia    Hypertension    Past Surgical  History:  Procedure Laterality Date   BREAST BIOPSY Right 2000   BREAST EXCISIONAL BIOPSY Bilateral 06/14/1999   benign   BREAST EXCISIONAL BIOPSY Left 1974   benign   COLONOSCOPY     SHOULDER ARTHROSCOPY WITH ROTATOR CUFF REPAIR AND SUBACROMIAL DECOMPRESSION  07/29/2012   Procedure: SHOULDER ARTHROSCOPY WITH ROTATOR CUFF REPAIR AND SUBACROMIAL DECOMPRESSION;  Surgeon: Cammie Sickle., MD;  Location: [place];  Service: Orthopedics;  Laterality: Right;  Arthroscopy Subacromial Decompression, Distal Clavicle Resection, Arthroscopic Subscapularis Repair, Open Repair of Supraspinatus and Infraspinatus     TUBAL LIGATION     TUMOR EXCISION     right jaw   Social History   Tobacco Use  Smoking Status Never  Smokeless Tobacco Never  Tobacco Comments   n/a    Barnett Hatter 02/21/2022, 3:57 PM

## 2022-02-21 NOTE — Progress Notes (Signed)
[facility] PREP Evaluation  Patient Details  Name: Monica Ryan MRN: [id number] Date of Birth: 1955-11-14 Age: 66 y.o. PCP: Dorothyann Peng, MD  Vitals:   02/21/22 1015  BP: 122/70  Pulse: 74  Weight: 189 lb (85.7 kg)     [facility] Eval - 02/21/22 1500       [facility] "PREP" Location   [facility] "PREP" Location [facility]      Referral    Referring Provider Allyne Gee    Reason for referral Diabetes;Obesitity/Overweight;Inactivity    Program Start Date 02/26/22   M/W 1030am to 1145am x 12 wks     Measurement   Waist Circumference 40.5 inches    Hip Circumference 45 inches    Body fat 44.9 percent      Information for Trainer   Goals Wt Loss goal to 150, 20-24 lbs in 12 wks    Current Exercise intermittent walks, yard work    Orthopedic Concerns Plantar fasciitis, Neck pain, Rotator cuff surgery right side    Pertinent Medical History DM2, CKD, inc chol    Current Barriers none    Medications that affect exercise Medication causing dizziness/drowsiness      Timed Up and Go (TUGS)   Timed Up and Go Low risk <9 seconds      Mobility and Daily Activities   I find it easy to walk up or down two or more flights of stairs. 3    I have no trouble taking out the trash. 4    I do housework such as vacuuming and dusting on my own without difficulty. 4    I can easily lift a gallon of milk (8lbs). 4    I can easily walk a mile. 2    I have no trouble reaching into high cupboards or reaching down to pick up something from the floor. 4    I do not have trouble doing out-door work such as Loss adjuster, chartered, raking leaves, or gardening. 4      Mobility and Daily Activities   I feel independent. 4    I feel energetic. 3    I live an active life.  4    I feel strong. 3    I feel healthy. 3    I feel active as other people my age. 4      How fit and strong are you.   Fit and Strong Total Score 46            Past Medical History:  Diagnosis Date   Diabetes mellitus without complication  ([facility])    Hyperlipemia    Hypertension    Past Surgical History:  Procedure Laterality Date   BREAST BIOPSY Right 2000   BREAST EXCISIONAL BIOPSY Bilateral 06/14/1999   benign   BREAST EXCISIONAL BIOPSY Left 1974   benign   COLONOSCOPY     SHOULDER ARTHROSCOPY WITH ROTATOR CUFF REPAIR AND SUBACROMIAL DECOMPRESSION  07/29/2012   Procedure: SHOULDER ARTHROSCOPY WITH ROTATOR CUFF REPAIR AND SUBACROMIAL DECOMPRESSION;  Surgeon: Wyn Forster., MD;  Location: [facility];  Service: Orthopedics;  Laterality: Right;  Arthroscopy Subacromial Decompression, Distal Clavicle Resection, Arthroscopic Subscapularis Repair, Open Repair of Supraspinatus and Infraspinatus     TUBAL LIGATION     TUMOR EXCISION     right jaw   Social History   Tobacco Use  Smoking Status Never  Smokeless Tobacco Never  Tobacco Comments   n/a    Bonnye Fava 02/21/2022, 3:58 PM

## 2022-02-28 ENCOUNTER — Ambulatory Visit (INDEPENDENT_AMBULATORY_CARE_PROVIDER_SITE_OTHER): Payer: No Typology Code available for payment source

## 2022-02-28 DIAGNOSIS — N182 Chronic kidney disease, stage 2 (mild): Secondary | ICD-10-CM

## 2022-02-28 DIAGNOSIS — E1122 Type 2 diabetes mellitus with diabetic chronic kidney disease: Secondary | ICD-10-CM

## 2022-02-28 DIAGNOSIS — M2142 Flat foot [pes planus] (acquired), left foot: Secondary | ICD-10-CM

## 2022-02-28 DIAGNOSIS — M2141 Flat foot [pes planus] (acquired), right foot: Secondary | ICD-10-CM

## 2022-02-28 NOTE — Progress Notes (Signed)
SITUATION Reason for Visit: Fitting of Diabetic Shoes & Insoles Patient / Caregiver Report:  Patient is satisfied with fit and function of shoes and insoles.  OBJECTIVE DATA: Patient History / Diagnosis:     ICD-10-CM   1. Type 2 diabetes mellitus with stage 2 chronic kidney disease, without long-term current use of insulin (HCC)  E11.22    N18.2     2. Pes planus of both feet  M21.41    M21.42       Change in Status:   None  ACTIONS PERFORMED: In-Person Delivery, patient was fit with: - 1x pair [id number] PDAC approved prefabricated Diabetic Shoes: [id number] 7.5W - 3x pair [id number] PDAC approved vacuum formed custom diabetic insoles; [employer]: [id number]  Shoes and insoles were verified for structural integrity and safety. Patient wore shoes and insoles in office. Skin was inspected and free of areas of concern after wearing shoes and inserts. Shoes and inserts fit properly. Patient / Caregiver provided with ferbal instruction and demonstration regarding donning, doffing, wear, care, proper fit, function, purpose, cleaning, and use of shoes and insoles ' and in all related precautions and risks and benefits regarding shoes and insoles. Patient / Caregiver was instructed to wear properly fitting socks with shoes at all times. Patient was also provided with verbal instruction regarding how to report any failures or malfunctions of shoes or inserts, and necessary follow up care. Patient / Caregiver was also instructed to contact physician regarding change in status that may affect function of shoes and inserts.   Patient / Caregiver verbalized undersatnding of instruction provided. Patient / Caregiver demonstrated independence with proper donning and doffing of shoes and inserts.  PLAN Patient to follow with treating physician as recommended. Plan of care was discussed with and agreed upon by patient and/or caregiver. All questions were answered and concerns addressed.

## 2022-02-28 NOTE — Progress Notes (Signed)
[facility] PREP Weekly Session  Patient Details  Name: TWILIA YAKLIN MRN: [id number] Date of Birth: [birth year] Age: 66 y.o. PCP: Dorothyann Peng, MD  Vitals:   02/26/22 1030  Weight: 186 lb (84.4 kg)     [facility] Weekly seesion - 02/28/22 0900       [facility] "PREP" Location   [facility]P" [facility]      Weekly Session   Topic Discussed Goal setting and welcome to the program   scale of perceived exertion   Minutes exercised this week 120 minutes    Classes attended to date 1             Pam Jerral Bonito 02/28/2022, 9:49 AM

## 2022-03-06 NOTE — Progress Notes (Signed)
[facility] PREP Weekly Session  Patient Details  Name: Monica Ryan MRN: [id number] Date of Birth: 12/15/1955 Age: 66 y.o. PCP: Dorothyann Peng, MD  Vitals:   03/05/22 1030  Weight: 187 lb (84.8 kg)     [facility] Weekly seesion - 03/06/22 1800       [facility] "PREP" Location   [facility] "PREP" Location [facility]      Weekly Session   Topic Discussed Importance of resistance training;Other ways to be active    Minutes exercised this week 150 minutes    Classes attended to date 3             Bonnye Fava 03/06/2022, 6:01 PM

## 2022-03-13 NOTE — Progress Notes (Signed)
[facility] PREP Weekly Session  Patient Details  Name: Monica Ryan MRN: [id number] Date of Birth: 06-Jul-1956 Age: 66 y.o. PCP: Dorothyann Peng, MD  Vitals:   03/12/22 1030  Weight: 185 lb 6.4 oz (84.1 kg)     [facility] Weekly seesion - 03/13/22 0900       [facility] "PREP" Location   [facility]      Weekly Session   Topic Discussed Healthy eating tips    Minutes exercised this week 315 minutes    Classes attended to date 5             Pam Jerral Bonito 03/13/2022, 9:43 AM

## 2022-03-21 ENCOUNTER — Encounter: Payer: Self-pay | Admitting: Internal Medicine

## 2022-03-23 ENCOUNTER — Other Ambulatory Visit: Payer: Self-pay | Admitting: Internal Medicine

## 2022-03-23 DIAGNOSIS — Z1231 Encounter for screening mammogram for malignant neoplasm of breast: Secondary | ICD-10-CM

## 2022-03-28 ENCOUNTER — Ambulatory Visit (INDEPENDENT_AMBULATORY_CARE_PROVIDER_SITE_OTHER): Payer: No Typology Code available for payment source | Admitting: Internal Medicine

## 2022-03-28 ENCOUNTER — Encounter: Payer: Self-pay | Admitting: Internal Medicine

## 2022-03-28 VITALS — BP 130/72 | HR 72 | Temp 97.9°F | Ht 61.0 in | Wt 184.8 lb

## 2022-03-28 DIAGNOSIS — E6609 Other obesity due to excess calories: Secondary | ICD-10-CM | POA: Diagnosis not present

## 2022-03-28 DIAGNOSIS — Z6834 Body mass index (BMI) 34.0-34.9, adult: Secondary | ICD-10-CM

## 2022-03-28 DIAGNOSIS — E7800 Pure hypercholesterolemia, unspecified: Secondary | ICD-10-CM

## 2022-03-28 DIAGNOSIS — E66811 Obesity, class 1: Secondary | ICD-10-CM

## 2022-03-28 LAB — ALT: ALT: 22 IU/L (ref 0–32)

## 2022-03-28 NOTE — Patient Instructions (Signed)

## 2022-03-28 NOTE — Progress Notes (Signed)
Rich Brave Llittleton,acting as a Education administrator for [name], MD.,have documented all relevant documentation on the behalf of [name], MD,as directed by  [name], MD while in the presence of [name], MD.  This visit occurred during the SARS-CoV-2 public health emergency.  Safety protocols were in place, including screening questions prior to the visit, additional usage of staff PPE, and extensive cleaning of exam room while observing appropriate contact time as indicated for disinfecting solutions.  Subjective:     Patient ID: Monica Ryan , female    DOB: 01-10-1956 , 66 y.o.   MRN: [id number]   Chief Complaint  Patient presents with   Hyperlipidemia    HPI  Patient presents today for a chol check. She was recently switched to Atorvastatin. She has not had any issues with the medication.   Hyperlipidemia This is a chronic problem. The problem is uncontrolled. Exacerbating diseases include diabetes. Pertinent negatives include no leg pain, myalgias or shortness of breath. Current antihyperlipidemic treatment includes statins, diet change and exercise. The current treatment provides moderate improvement of lipids. Compliance problems include adherence to exercise.  Risk factors for coronary artery disease include diabetes mellitus, dyslipidemia, hypertension, a sedentary lifestyle and post-menopausal.     Past Medical History:  Diagnosis Date   Diabetes mellitus without complication ([place])    Hyperlipemia    Hypertension      Family History  Problem Relation Age of Onset   Diabetes Mother    Alzheimer's disease Father    Diabetes Sister    Diabetes Brother      Current Outpatient Medications:    Alcohol Swabs (ALCOHOL PADS) 70 % PADS, Use as instructed to check blood sugars 1 time per day dx: e11.65, Disp: 150 each, Rfl: 2   amLODipine (NORVASC) 5 MG tablet, TAKE 1 TABLET BY MOUTH EVERY DAY, Disp: 90 tablet, Rfl: 1   aspirin EC 81 MG tablet, Take 81 mg  by mouth daily., Disp: , Rfl:    atorvastatin (LIPITOR) 20 MG tablet, Take 1 tablet (20 mg total) by mouth daily., Disp: 90 tablet, Rfl: 3   Blood Glucose Monitoring Suppl (ONETOUCH VERIO REFLECT) w/Device KIT, Check blood sugars once daily E11.69, Disp: 1 kit, Rfl: 1   FARXIGA 10 MG TABS tablet, TAKE 1 TABLET BY MOUTH EVERY DAY IN THE MORNING, Disp: 90 tablet, Rfl: 1   glucose blood (ONETOUCH VERIO) test strip, Check blood sugars once daily E11.69, Disp: 100 each, Rfl: 3   Lancets (ONETOUCH DELICA PLUS [id number]) MISC, Check blood sugars once daily E11.69, Disp: 100 each, Rfl: 3   lisinopril-hydrochlorothiazide (ZESTORETIC) 20-12.5 MG tablet, Take 1 tablet by mouth daily., Disp: 90 tablet, Rfl: 2   OZEMPIC, 2 MG/DOSE, 8 MG/3ML SOPN, INJECT 2 MG INTO THE SKIN ONCE A WEEK., Disp: 9 mL, Rfl: 2   No Known Allergies   Review of Systems  Constitutional: Negative.   Respiratory: Negative.  Negative for shortness of breath.   Cardiovascular: Negative.   Gastrointestinal: Negative.   Musculoskeletal:  Negative for myalgias.  Neurological: Negative.   Psychiatric/Behavioral: Negative.       Today's Vitals   03/28/22 1009  BP: 130/72  Pulse: 72  Temp: 97.9 F (36.6 C)  Weight: 184 lb 12.8 oz (83.8 kg)  Height: $Remove'5\' 1"'oLfhYss$  (1.549 m)  PainSc: 0-No pain   Body mass index is 34.92 kg/m.  Wt Readings from Last 3 Encounters:  03/28/22 184 lb 12.8 oz (83.8 kg)  03/19/22 186  lb (84.4 kg)  03/12/22 185 lb 6.4 oz (84.1 kg)     Objective:  Physical Exam Vitals and nursing note reviewed.  Constitutional:      Appearance: Normal appearance. She is obese.  HENT:     Head: Normocephalic and atraumatic.  Eyes:     Extraocular Movements: Extraocular movements intact.  Cardiovascular:     Rate and Rhythm: Normal rate and regular rhythm.     Heart sounds: Normal heart sounds.  Pulmonary:     Effort: Pulmonary effort is normal.     Breath sounds: Normal breath sounds.  Musculoskeletal:      Cervical back: Normal range of motion.  Skin:    General: Skin is warm.  Neurological:     General: No focal deficit present.     Mental Status: She is alert.  Psychiatric:        Mood and Affect: Mood normal.        Behavior: Behavior normal.         Assessment And Plan:     1. Pure hypercholesterolemia Comments: Goal LDL <70. Thus far, she has tolerated atorvastatin 20mg  w/o any issues. I will check lipid panel and adjust meds as needed. F/u Sept 2023 for DM check.  - Lipid panel - ALT  2. Class 1 obesity due to excess calories with serious comorbidity and body mass index (BMI) of 34.0 to 34.9 in adult Comments: She is now in the PREP program and has found it educational. She is reminded to aim for at least 150 minutes of exercise per week.     Patient was given opportunity to ask questions. Patient verbalized understanding of the plan and was able to repeat key elements of the plan. All questions were answered to their satisfaction.   I, [name], MD, have reviewed all documentation for this visit. The documentation on 03/28/22 for the exam, diagnosis, procedures, and orders are all accurate and complete.   IF YOU HAVE BEEN REFERRED TO A SPECIALIST, IT MAY TAKE 1-2 WEEKS TO SCHEDULE/PROCESS THE REFERRAL. IF YOU HAVE NOT HEARD FROM US/SPECIALIST IN TWO WEEKS, PLEASE GIVE [place] A CALL AT [phone] X [phone].   THE PATIENT IS ENCOURAGED TO PRACTICE SOCIAL DISTANCING DUE TO THE COVID-19 PANDEMIC.

## 2022-03-29 LAB — LIPID PANEL
Chol/HDL Ratio: 2 ratio (ref 0.0–4.4)
Cholesterol, Total: 158 mg/dL (ref 100–199)
HDL: 78 mg/dL (ref >39)
LDL Chol Calc (NIH): 64 mg/dL (ref 0–99)
Triglycerides: 86 mg/dL (ref 0–149)
VLDL Cholesterol Cal: 16 mg/dL (ref 5–40)

## 2022-04-03 NOTE — Progress Notes (Signed)
[facility] PREP Weekly Session  Patient Details  Name: Monica Ryan MRN: [id number] Date of Birth: [birth year] Age: 67 y.o. PCP: Dorothyann Peng, MD  Vitals:   04/02/22 1030  Weight: 186 lb (84.4 kg)     [facility] Weekly seesion - 04/03/22 1600       [facility] "PREP" Location   [facility]      Weekly Session   Topic Discussed Restaurant Eating   salt demo   Minutes exercised this week 135 minutes    Classes attended to date 9             Bonnye Fava 04/03/2022, 4:58 PM

## 2022-04-12 NOTE — Progress Notes (Signed)
[facility] PREP Weekly Session  Patient Details  Name: Monica Ryan MRN: [id number] Date of Birth: January 02, 1956 Age: 66 y.o. PCP: Dorothyann Peng, MD  Vitals:   04/09/22 1030  Weight: 183 lb (83 kg)     [facility] Weekly seesion - 04/12/22 0900       [facility]PREP" Location   [facility]      Weekly Session   Topic Discussed Stress management and problem solving   meditation, chair yoga   Minutes exercised this week 390 minutes    Classes attended to date 45             Pam Jerral Bonito 04/12/2022, 9:52 AM

## 2022-04-16 NOTE — Progress Notes (Signed)
Addendum to class notes: salt and sugar demos done

## 2022-04-16 NOTE — Progress Notes (Signed)
[facility] PREP Weekly Session  Patient Details  Name: Monica Ryan MRN: [id number] Date of Birth: [birth year] Age: 66 y.o. PCP: Dorothyann Peng, MD  Vitals:   04/16/22 1204  Weight: 185 lb (83.9 kg)     [facility] Weekly seesion - 04/16/22 1200       [facility] "PREP" Location   [facility]PREP[facility] Location [facility]      Weekly Session   Topic Discussed Expectations and non-scale victories    Minutes exercised this week 255 minutes    Classes attended to date 13             Bonnye Fava 04/16/2022, 12:05 PM

## 2022-04-25 ENCOUNTER — Telehealth: Payer: No Typology Code available for payment source

## 2022-04-25 NOTE — Progress Notes (Signed)
[facility] PREP Weekly Session  Patient Details  Name: CRYSTALYNN MCINERNEY MRN: [id number] Date of Birth: Nov 20, 1955 Age: 66 y.o. PCP: Dorothyann Peng, MD  Vitals:   04/23/22 1030  Weight: 184 lb (83.5 kg)     [facility] Weekly seesion - 04/25/22 1200       [facility] "PREP" Location   [facility]PREP[facility] Location [facility]      Weekly Session   Topic Discussed --   Portions   Minutes exercised this week 240 minutes    Classes attended to date 63             Pam Jerral Bonito 04/25/2022, 12:07 PM

## 2022-05-01 NOTE — Progress Notes (Signed)
[facility] PREP Weekly Session  Patient Details  Name: Monica Ryan MRN: [id number] Date of Birth: 1955/12/07 Age: 66 y.o. PCP: Dorothyann Peng, MD  Vitals:   04/30/22 1030  Weight: 185 lb (83.9 kg)     [facility] Weekly seesion - 05/01/22 1600       [facility] "PREP" Location   [facility]PREP[facility] Location [facility]      Weekly Session   Topic Discussed Finding support    Minutes exercised this week 255 minutes    Classes attended to date 17             Bonnye Fava 05/01/2022, 4:13 PM

## 2022-05-03 ENCOUNTER — Encounter: Payer: Self-pay | Admitting: Internal Medicine

## 2022-05-08 ENCOUNTER — Ambulatory Visit
Admission: RE | Admit: 2022-05-08 | Discharge: 2022-05-08 | Disposition: A | Discharge status: Home / Self Care | Payer: No Typology Code available for payment source | Source: Ambulatory Visit | Attending: Internal Medicine | Admitting: Internal Medicine

## 2022-05-08 DIAGNOSIS — Z1231 Encounter for screening mammogram for malignant neoplasm of breast: Secondary | ICD-10-CM | POA: Diagnosis not present

## 2022-05-08 NOTE — Progress Notes (Signed)
[facility] PREP Weekly Session  Patient Details  Name: MOESHA SARCHET MRN: [id number] Date of Birth: March 13, 1956 Age: 66 y.o. PCP: Dorothyann Peng, MD  Vitals:   05/07/22 1030  Weight: 185 lb (83.9 kg)     [facility] Weekly seesion - 05/08/22 1500       [facility] "PREP" Location   [facility]PREP[facility] Location [facility]      Weekly Session   Topic Discussed Calorie breakdown    Minutes exercised this week 245 minutes    Classes attended to date 5             Pam Jerral Bonito 05/08/2022, 3:53 PM

## 2022-05-18 ENCOUNTER — Encounter: Payer: Self-pay | Admitting: Internal Medicine

## 2022-05-21 ENCOUNTER — Other Ambulatory Visit: Payer: Self-pay

## 2022-05-21 MED ORDER — DAPAGLIFLOZIN PROPANEDIOL 10 MG PO TABS: ORAL_TABLET | ORAL | 1 refills | Status: DC

## 2022-05-22 NOTE — Progress Notes (Signed)
[facility] PREP Weekly Session  Patient Details  Name: Monica Ryan MRN: [id number] Date of Birth: [birth year] Age: 66 y.o. PCP: Dorothyann Peng, MD  Vitals:   05/21/22 1030  Weight: 185 lb (83.9 kg)     [facility] Weekly seesion - 05/22/22 1300       [facility] "PREP" Location   [facility]      Weekly Session   Topic Discussed Hitting roadblocks    Minutes exercised this week 210 minutes   and 3 days of strength training, 2 days of stretching, fit test done   Classes attended to date 20             Bonnye Fava 05/22/2022, 1:39 PM

## 2022-05-29 ENCOUNTER — Telehealth: Payer: Self-pay

## 2022-05-29 NOTE — Chronic Care Management (AMB) (Signed)
AZ&Me Notification:  Hendricks Limes 10mg   was shipped on 05/17/2022, allow 1-2 business days for shipment to arrive. Shipment was sent via 05/19/2022, Tracking number [id number].  [id number], CMA Clinical Pharmacist Assistant [phone]

## 2022-05-31 NOTE — Progress Notes (Signed)
[facility] PREP Evaluation  Patient Details  Name: Monica Ryan MRN: [id number] Date of Birth: [birth year] Age: 66 y.o. PCP: Dorothyann Peng, MD  Vitals:   05/30/22 1100  BP: 118/60  Pulse: 86  SpO2: 95%  Weight: 184 lb (83.5 kg)     [facility] Eval - 05/31/22 1200       [facility] "PREP" Location   [facility] Location [facility]      Referral    Referring Provider [facility] Start Date --   Final PREP class 05/21/22     Measurement   Waist Circumference 39 inches    Hip Circumference 42 inches    Body fat 45.5 percent      Information for Trainer   Goals reset      Mobility and Daily Activities   I find it easy to walk up or down two or more flights of stairs. 4    I have no trouble taking out the trash. 4    I do housework such as vacuuming and dusting on my own without difficulty. 4    I can easily lift a gallon of milk (8lbs). 4    I can easily walk a mile. 3    I have no trouble reaching into high cupboards or reaching down to pick up something from the floor. 4    I do not have trouble doing out-door work such as Loss adjuster, chartered, raking leaves, or gardening. 4      Mobility and Daily Activities   I feel younger than my age. 3    I feel independent. 4    I feel energetic. 3    I live an active life.  4    I feel strong. 3    I feel healthy. 3    I feel active as other people my age. 4      How fit and strong are you.   Fit and Strong Total Score 51            Past Medical History:  Diagnosis Date   Diabetes mellitus without complication (HCC)    Hyperlipemia    Hypertension    Past Surgical History:  Procedure Laterality Date   BREAST BIOPSY Right 2000   BREAST EXCISIONAL BIOPSY Bilateral 06/14/1999   benign   BREAST EXCISIONAL BIOPSY Left 1974   benign   COLONOSCOPY     SHOULDER ARTHROSCOPY WITH ROTATOR CUFF REPAIR AND SUBACROMIAL DECOMPRESSION  07/29/2012   Procedure: SHOULDER ARTHROSCOPY WITH ROTATOR CUFF REPAIR AND SUBACROMIAL  DECOMPRESSION;  Surgeon: Wyn Forster., MD;  Location: [facility];  Service: Orthopedics;  Laterality: Right;  Arthroscopy Subacromial Decompression, Distal Clavicle Resection, Arthroscopic Subscapularis Repair, Open Repair of Supraspinatus and Infraspinatus     TUBAL LIGATION     TUMOR EXCISION     right jaw   Social History   Tobacco Use  Smoking Status Never  Smokeless Tobacco Never  Tobacco Comments   n/a   Goals reset. Has solid plans to continue to exercise in particular strength training. Has inches off mid section. Continue to limit added sugars and inc fruits and vegetables Attended > 20 works outs and 11 of 12 educational sessions Cardio march test: 224 to 238 Sit to stand: 11 to 13 Bicep curl: 18 to 22 Balance improved in single leg-needs to continue to work on balance maybe try different shoes for her plantar fasciitis.    Pam Jerral Bonito 05/31/2022,  12:27 PM

## 2022-06-04 ENCOUNTER — Ambulatory Visit: Payer: No Typology Code available for payment source | Admitting: Internal Medicine

## 2022-06-14 ENCOUNTER — Ambulatory Visit (INDEPENDENT_AMBULATORY_CARE_PROVIDER_SITE_OTHER): Payer: No Typology Code available for payment source | Admitting: Internal Medicine

## 2022-06-14 ENCOUNTER — Encounter: Payer: Self-pay | Admitting: Internal Medicine

## 2022-06-14 VITALS — BP 140/90 | HR 71 | Temp 98.0°F | Ht 61.0 in | Wt 182.4 lb

## 2022-06-14 DIAGNOSIS — I129 Hypertensive chronic kidney disease with stage 1 through stage 4 chronic kidney disease, or unspecified chronic kidney disease: Secondary | ICD-10-CM | POA: Diagnosis not present

## 2022-06-14 DIAGNOSIS — E6609 Other obesity due to excess calories: Secondary | ICD-10-CM | POA: Diagnosis not present

## 2022-06-14 DIAGNOSIS — L304 Erythema intertrigo: Secondary | ICD-10-CM

## 2022-06-14 DIAGNOSIS — N182 Chronic kidney disease, stage 2 (mild): Secondary | ICD-10-CM

## 2022-06-14 DIAGNOSIS — Z23 Encounter for immunization: Secondary | ICD-10-CM | POA: Diagnosis not present

## 2022-06-14 DIAGNOSIS — E1122 Type 2 diabetes mellitus with diabetic chronic kidney disease: Secondary | ICD-10-CM

## 2022-06-14 DIAGNOSIS — Z6834 Body mass index (BMI) 34.0-34.9, adult: Secondary | ICD-10-CM

## 2022-06-14 MED ORDER — NYSTATIN 100000 UNIT/GM EX CREA
1.0000 | TOPICAL_CREAM | Freq: Two times a day (BID) | CUTANEOUS | 0 refills | Status: DC | PRN
Start: 2022-06-14 — End: 2023-08-12

## 2022-06-14 NOTE — Patient Instructions (Signed)

## 2022-06-14 NOTE — Progress Notes (Signed)
Rich Brave Llittleton,acting as a Education administrator for [name], MD.,have documented all relevant documentation on the behalf of [name], MD,as directed by  [name], MD while in the presence of [name], MD.    Subjective:     Patient ID: Monica Ryan , female    DOB: 1956-07-16 , 66 y.o.   MRN: [id number]   Chief Complaint  Patient presents with   Diabetes   Hypertension    HPI  She is here today for diabetes/BP check. She reports compliance with meds.  She denies headaches, chest pain and shortness of breath. She doesn't have any concerns or questions today.   Diabetes She presents for her follow-up diabetic visit. She has type 2 diabetes mellitus. Her disease course has been stable. There are no hypoglycemic associated symptoms. Pertinent negatives for diabetes include no blurred vision, no polydipsia, no polyphagia and no polyuria. There are no hypoglycemic complications. Risk factors for coronary artery disease include diabetes mellitus, dyslipidemia, hypertension and post-menopausal. She is following a diabetic diet. She participates in exercise three times a week. Her breakfast blood glucose is taken between 7-8 am. Her breakfast blood glucose range is generally 90-110 mg/dl. An ACE inhibitor/angiotensin II receptor blocker is being taken. Eye exam is current.  Hypertension This is a chronic problem. The current episode started more than 1 year ago. The problem is controlled. Pertinent negatives include no blurred vision. Risk factors for coronary artery disease include diabetes mellitus, dyslipidemia and obesity. The current treatment provides moderate improvement.     Past Medical History:  Diagnosis Date   Diabetes mellitus without complication ([place])    Hyperlipemia    Hypertension      Family History  Problem Relation Age of Onset   Diabetes Mother    Alzheimer's disease Father    Diabetes Sister    Diabetes Brother      Current Outpatient  Medications:    Alcohol Swabs (ALCOHOL PADS) 70 % PADS, Use as instructed to check blood sugars 1 time per day dx: e11.65, Disp: 150 each, Rfl: 2   amLODipine (NORVASC) 5 MG tablet, TAKE 1 TABLET BY MOUTH EVERY DAY, Disp: 90 tablet, Rfl: 1   aspirin EC 81 MG tablet, Take 81 mg by mouth daily., Disp: , Rfl:    atorvastatin (LIPITOR) 20 MG tablet, Take 1 tablet (20 mg total) by mouth daily., Disp: 90 tablet, Rfl: 3   Blood Glucose Monitoring Suppl (ONETOUCH VERIO REFLECT) w/Device KIT, Check blood sugars once daily E11.69, Disp: 1 kit, Rfl: 1   dapagliflozin propanediol (FARXIGA) 10 MG TABS tablet, TAKE 1 TABLET BY MOUTH EVERY DAY IN THE MORNING, Disp: 90 tablet, Rfl: 1   glucose blood (ONETOUCH VERIO) test strip, Check blood sugars once daily E11.69, Disp: 100 each, Rfl: 3   Lancets (ONETOUCH DELICA PLUS [id number]) MISC, Check blood sugars once daily E11.69, Disp: 100 each, Rfl: 3   lisinopril-hydrochlorothiazide (ZESTORETIC) 20-12.5 MG tablet, Take 1 tablet by mouth daily., Disp: 90 tablet, Rfl: 2   nystatin cream (MYCOSTATIN), Apply 1 Application topically 2 (two) times daily as needed for dry skin., Disp: 30 g, Rfl: 0   OZEMPIC, 2 MG/DOSE, 8 MG/3ML SOPN, INJECT 2 MG INTO THE SKIN ONCE A WEEK., Disp: 9 mL, Rfl: 2   No Known Allergies   Review of Systems  Constitutional: Negative.   Eyes: Negative.  Negative for blurred vision.  Respiratory: Negative.    Cardiovascular: Negative.   Endocrine: Negative for polydipsia,  polyphagia and polyuria.  Musculoskeletal: Negative.   Skin:  Positive for rash.       She c/o dry, itchy rash in between her legs. No vaginal issues. Her pantylines irritate it. She has tried otc hc cream without relief. Denies seeing any blister.   Neurological: Negative.   Psychiatric/Behavioral: Negative.       Today's Vitals   06/14/22 1155 06/14/22 1209  BP: (!) 140/80 (!) 140/90  Pulse: 71   Temp: 98 F (36.7 C)   Weight: 182 lb 6.4 oz (82.7 kg)   Height: 5\' 1"   (1.549 m)   PainSc: 0-No pain    Body mass index is 34.46 kg/m.  Wt Readings from Last 3 Encounters:  06/14/22 182 lb 6.4 oz (82.7 kg)  05/30/22 184 lb (83.5 kg)  05/21/22 185 lb (83.9 kg)    BP Readings from Last 3 Encounters:  06/14/22 (!) 140/90  05/30/22 118/60  03/28/22 130/72    Objective:  Physical Exam Vitals and nursing note reviewed.  Constitutional:      Appearance: Normal appearance. She is obese.  HENT:     Head: Normocephalic and atraumatic.  Eyes:     Extraocular Movements: Extraocular movements intact.  Cardiovascular:     Rate and Rhythm: Normal rate and regular rhythm.     Heart sounds: Normal heart sounds.  Pulmonary:     Effort: Pulmonary effort is normal.     Breath sounds: Normal breath sounds.  Musculoskeletal:     Cervical back: Normal range of motion.  Skin:    General: Skin is warm.     Findings: Rash present.  Neurological:     General: No focal deficit present.     Mental Status: She is alert.  Psychiatric:        Mood and Affect: Mood normal.        Behavior: Behavior normal.       Assessment And Plan:     1. Type 2 diabetes mellitus with stage 2 chronic kidney disease, without long-term current use of insulin (HCC) Comments: Chronic, I will check labs as below.  Importance of dietary/medication compliance was stressed to the patient. She will rto in 3 months for re-evaluation. - BMP8+EGFR - Hemoglobin A1c  2. Hypertensive nephropathy Comments: Chronic, uncontrolled. She is encouraged to folllow low sodium diet. She will c/w amlodipine 5mg  and lisinopril/hct 20/12.5mg  daily for now.  - BMP8+EGFR  3. Intertrigo Comments: I will send rx nystatin cream to apply to affected area bid.   4. Class 1 obesity due to excess calories with serious comorbidity and body mass index (BMI) of 34.0 to 34.9 in adult Comments: She was congratulated on her 3lb weight loss since August 2023. She is encouraged to aim for at least 150 minutes of  exercise per week.   5. Immunization due - Flu Vaccine QUAD High Dose(Fluad)     Patient was given opportunity to ask questions. Patient verbalized understanding of the plan and was able to repeat key elements of the plan. All questions were answered to their satisfaction.   I, [name], MD, have reviewed all documentation for this visit. The documentation on 06/14/22 for the exam, diagnosis, procedures, and orders are all accurate and complete.   IF YOU HAVE BEEN REFERRED TO A SPECIALIST, IT MAY TAKE 1-2 WEEKS TO SCHEDULE/PROCESS THE REFERRAL. IF YOU HAVE NOT HEARD FROM US/SPECIALIST IN TWO WEEKS, PLEASE GIVE [place] A CALL AT [phone].   THE PATIENT IS ENCOURAGED  TO PRACTICE SOCIAL DISTANCING DUE TO THE COVID-19 PANDEMIC.

## 2022-06-15 LAB — BMP8+EGFR
BUN/Creatinine Ratio: 11 — ABNORMAL LOW (ref 12–28)
BUN: 11 mg/dL (ref 8–27)
CO2: 23 mmol/L (ref 20–29)
Calcium: 9.5 mg/dL (ref 8.7–10.3)
Chloride: 100 mmol/L (ref 96–106)
Creatinine, Ser: 1.02 mg/dL — ABNORMAL HIGH (ref 0.57–1.00)
Glucose: 81 mg/dL (ref 70–99)
Potassium: 4 mmol/L (ref 3.5–5.2)
Sodium: 140 mmol/L (ref 134–144)
eGFR: 60 mL/min/{1.73_m2} (ref >59)

## 2022-06-15 LAB — HEMOGLOBIN A1C
Est. average glucose Bld gHb Est-mCnc: 143 mg/dL
Hgb A1c MFr Bld: 6.6 % — ABNORMAL HIGH (ref 4.8–5.6)

## 2022-06-26 DIAGNOSIS — Z008 Encounter for other general examination: Secondary | ICD-10-CM | POA: Diagnosis not present

## 2022-06-26 DIAGNOSIS — Z6834 Body mass index (BMI) 34.0-34.9, adult: Secondary | ICD-10-CM | POA: Diagnosis not present

## 2022-06-26 DIAGNOSIS — E1169 Type 2 diabetes mellitus with other specified complication: Secondary | ICD-10-CM | POA: Diagnosis not present

## 2022-06-26 DIAGNOSIS — I129 Hypertensive chronic kidney disease with stage 1 through stage 4 chronic kidney disease, or unspecified chronic kidney disease: Secondary | ICD-10-CM | POA: Diagnosis not present

## 2022-06-26 DIAGNOSIS — N182 Chronic kidney disease, stage 2 (mild): Secondary | ICD-10-CM | POA: Diagnosis not present

## 2022-06-26 DIAGNOSIS — E1122 Type 2 diabetes mellitus with diabetic chronic kidney disease: Secondary | ICD-10-CM | POA: Diagnosis not present

## 2022-06-26 DIAGNOSIS — E669 Obesity, unspecified: Secondary | ICD-10-CM | POA: Diagnosis not present

## 2022-06-26 DIAGNOSIS — E785 Hyperlipidemia, unspecified: Secondary | ICD-10-CM | POA: Diagnosis not present

## 2022-07-11 ENCOUNTER — Telehealth: Payer: Self-pay

## 2022-07-11 NOTE — Chronic Care Management (AMB) (Signed)
AZ&ME Notification:  Patient approved for 2024 Enrollment with [employer] Patient Assistance Program for Farxiga 10 mg, enrolment will end on September 24, 2023.  Tamala Melvin, CMA Clinical Pharmacist Assistant [phone]  

## 2022-07-17 ENCOUNTER — Other Ambulatory Visit: Payer: Self-pay | Admitting: Internal Medicine

## 2022-07-24 ENCOUNTER — Ambulatory Visit (INDEPENDENT_AMBULATORY_CARE_PROVIDER_SITE_OTHER): Payer: No Typology Code available for payment source | Admitting: Podiatry

## 2022-07-24 DIAGNOSIS — M722 Plantar fascial fibromatosis: Secondary | ICD-10-CM | POA: Diagnosis not present

## 2022-07-24 MED ORDER — DICLOFENAC SODIUM 75 MG PO TBEC: 75.0000 mg | DELAYED_RELEASE_TABLET | Freq: Two times a day (BID) | ORAL | 1 refills | Status: DC

## 2022-07-24 MED ORDER — BETAMETHASONE SOD PHOS & ACET 6 (3-3) MG/ML IJ SUSP
3.0000 mg | Freq: Once | INTRAMUSCULAR | Status: AC
  Administered 2022-07-24: 3 mg via INTRA_ARTICULAR

## 2022-07-24 NOTE — Progress Notes (Signed)
   Chief Complaint  Patient presents with   Foot Problem    Patient dx with PF through Dr. Cannon Kettle over 1 yr ago, bilateral feet, treatment includes insoles.     Subjective: 66 y.o. female presenting today for follow-up evaluation of chronic bilateral heel pain right greater than the left.  Patient wears custom molded diabetic insoles with shoes.  She has seen Dr. Cannon Kettle in the past who is no longer with our practice.  She has been prescribed diclofenac in the past which has helped alleviate a lot of her symptoms and pain.  She presents for further treatment and evaluation   Past Medical History:  Diagnosis Date   Diabetes mellitus without complication ([place])    Hyperlipemia    Hypertension    Past Surgical History:  Procedure Laterality Date   BREAST BIOPSY Right 2000   BREAST EXCISIONAL BIOPSY Bilateral 06/14/1999   benign   BREAST EXCISIONAL BIOPSY Left 1974   benign   COLONOSCOPY     SHOULDER ARTHROSCOPY WITH ROTATOR CUFF REPAIR AND SUBACROMIAL DECOMPRESSION  07/29/2012   Procedure: SHOULDER ARTHROSCOPY WITH ROTATOR CUFF REPAIR AND SUBACROMIAL DECOMPRESSION;  Surgeon: Cammie Sickle., MD;  Location: [facility];  Service: Orthopedics;  Laterality: Right;  Arthroscopy Subacromial Decompression, Distal Clavicle Resection, Arthroscopic Subscapularis Repair, Open Repair of Supraspinatus and Infraspinatus     TUBAL LIGATION     TUMOR EXCISION     right jaw   No Known Allergies   Objective: Physical Exam General: The patient is alert and oriented x3 in no acute distress.  Dermatology: Skin is warm, dry and supple bilateral lower extremities. Negative for open lesions or macerations bilateral.   Vascular: Dorsalis Pedis and Posterior Tibial pulses palpable bilateral.  Capillary fill time is immediate to all digits.  Neurological: Epicritic and protective threshold intact bilateral.   Musculoskeletal: Tenderness to palpation to the plantar aspect of the  bilateral heels along the plantar fascia. All other joints range of motion within normal limits bilateral. Strength 5/5 in all groups bilateral.   Assessment: 1. plantar fasciitis bilateral feet 2.  Symptomatic calluses bilateral feet  Plan of Care:  1. Patient evaluated. Xrays reviewed.   2. Injection of 0.5cc Celestone soluspan injected into the bilateral heels.  3.  Prescription for diclofenac 75 mg 2 times daily 4.  Continue wearing custom molded diabetic insoles and shoes.  Advised against going barefoot 5. Plantar fascial band(s) dispensed for bilateral plantar fasciitis. 6.  Excisional debridement of the hyperkeratotic calluses to the bilateral great toes was performed today using a 312 scalpel without incident or bleeding 7. Return to clinic as needed Edrick Kins, DPM [facility]  Dr. Edrick Kins, DPM    [address], [place] [address]                Office [phone]  Fax [phone]

## 2022-07-26 ENCOUNTER — Encounter: Payer: Self-pay | Admitting: Internal Medicine

## 2022-07-30 ENCOUNTER — Encounter: Payer: Self-pay | Admitting: Internal Medicine

## 2022-07-30 ENCOUNTER — Other Ambulatory Visit (HOSPITAL_COMMUNITY): Payer: Self-pay

## 2022-07-30 ENCOUNTER — Other Ambulatory Visit: Payer: Self-pay

## 2022-07-30 MED ORDER — OZEMPIC (2 MG/DOSE) 8 MG/3ML ~~LOC~~ SOPN
2.0000 mg | PEN_INJECTOR | SUBCUTANEOUS | 2 refills | Status: DC
  Filled 2022-07-30: qty 3, 28d supply, fill #0
  Filled 2022-08-02: qty 9, 84d supply, fill #0

## 2022-08-02 ENCOUNTER — Other Ambulatory Visit: Payer: Self-pay

## 2022-08-31 DIAGNOSIS — E119 Type 2 diabetes mellitus without complications: Secondary | ICD-10-CM | POA: Diagnosis not present

## 2022-09-12 ENCOUNTER — Encounter: Payer: Self-pay | Admitting: Internal Medicine

## 2022-09-13 ENCOUNTER — Other Ambulatory Visit: Payer: Self-pay | Admitting: Nurse Practitioner

## 2022-09-13 ENCOUNTER — Other Ambulatory Visit: Payer: Self-pay | Admitting: Internal Medicine

## 2022-09-13 MED ORDER — DAPAGLIFLOZIN PROPANEDIOL 10 MG PO TABS: ORAL_TABLET | ORAL | 1 refills | Status: DC

## 2022-09-26 ENCOUNTER — Encounter: Payer: Self-pay | Admitting: Internal Medicine

## 2022-09-28 ENCOUNTER — Ambulatory Visit: Payer: Self-pay

## 2022-09-28 ENCOUNTER — Other Ambulatory Visit: Payer: Self-pay

## 2022-09-28 MED ORDER — LISINOPRIL-HYDROCHLOROTHIAZIDE 20-12.5 MG PO TABS: 1.0000 | ORAL_TABLET | Freq: Every day | ORAL | 2 refills | Status: DC

## 2022-09-28 MED ORDER — ATORVASTATIN CALCIUM 20 MG PO TABS: 20.0000 mg | ORAL_TABLET | Freq: Every day | ORAL | 3 refills | Status: DC

## 2022-09-28 MED ORDER — AMLODIPINE BESYLATE 5 MG PO TABS: 5.0000 mg | ORAL_TABLET | Freq: Every day | ORAL | 1 refills | Status: DC

## 2022-09-28 NOTE — Progress Notes (Signed)
ENCOUNTER OPENED ERRONEOUSLY

## 2022-10-03 ENCOUNTER — Encounter: Payer: Self-pay | Admitting: Internal Medicine

## 2022-10-04 ENCOUNTER — Ambulatory Visit (INDEPENDENT_AMBULATORY_CARE_PROVIDER_SITE_OTHER): Payer: BC Managed Care – PPO | Admitting: Internal Medicine

## 2022-10-04 ENCOUNTER — Encounter: Payer: Self-pay | Admitting: Internal Medicine

## 2022-10-04 VITALS — BP 122/70 | HR 60 | Temp 98.2°F | Ht 61.0 in | Wt 182.4 lb

## 2022-10-04 DIAGNOSIS — E6609 Other obesity due to excess calories: Secondary | ICD-10-CM

## 2022-10-04 DIAGNOSIS — E1122 Type 2 diabetes mellitus with diabetic chronic kidney disease: Secondary | ICD-10-CM

## 2022-10-04 DIAGNOSIS — N182 Chronic kidney disease, stage 2 (mild): Secondary | ICD-10-CM | POA: Diagnosis not present

## 2022-10-04 DIAGNOSIS — I129 Hypertensive chronic kidney disease with stage 1 through stage 4 chronic kidney disease, or unspecified chronic kidney disease: Secondary | ICD-10-CM | POA: Diagnosis not present

## 2022-10-04 DIAGNOSIS — Z6834 Body mass index (BMI) 34.0-34.9, adult: Secondary | ICD-10-CM | POA: Diagnosis not present

## 2022-10-04 DIAGNOSIS — Z7984 Long term (current) use of oral hypoglycemic drugs: Secondary | ICD-10-CM | POA: Diagnosis not present

## 2022-10-04 NOTE — Patient Instructions (Signed)

## 2022-10-04 NOTE — Progress Notes (Signed)
Rich Brave [place],acting as a Education administrator for [name], MD.,have documented all relevant documentation on the behalf of [name], MD,as directed by  [name], MD while in the presence of [name], MD.    Subjective:     Patient ID: Monica Ryan , female    DOB: [birth year] , 67 y.o.   MRN: [id number]   Chief Complaint  Patient presents with   Diabetes   Hypertension    HPI  She is here today for diabetes/BP check. She reports compliance with meds.  She denies headaches, chest pain and shortness of breath. She states she has been exercising 3-4 days per week.  Diabetes She presents for her follow-up diabetic visit. She has type 2 diabetes mellitus. Her disease course has been stable. There are no hypoglycemic associated symptoms. Pertinent negatives for diabetes include no blurred vision, no polydipsia, no polyphagia and no polyuria. There are no hypoglycemic complications. Risk factors for coronary artery disease include diabetes mellitus, dyslipidemia, hypertension and post-menopausal. She is following a diabetic diet. She participates in exercise three times a week. Her breakfast blood glucose is taken between 7-8 am. Her breakfast blood glucose range is generally 90-110 mg/dl. An ACE inhibitor/angiotensin II receptor blocker is being taken. Eye exam is current.  Hypertension This is a chronic problem. The current episode started more than 1 year ago. The problem is controlled. Pertinent negatives include no blurred vision. Risk factors for coronary artery disease include diabetes mellitus, dyslipidemia and obesity. The current treatment provides moderate improvement.     Past Medical History:  Diagnosis Date   Diabetes mellitus without complication ([place])    Hyperlipemia    Hypertension      Family History  Problem Relation Age of Onset   Diabetes Mother    Alzheimer's disease Father    Diabetes Sister    Diabetes Brother      Current Outpatient  Medications:    Alcohol Swabs (ALCOHOL PADS) 70 % PADS, Use as instructed to check blood sugars 1 time per day dx: e11.65, Disp: 150 each, Rfl: 2   amLODipine (NORVASC) 5 MG tablet, Take 1 tablet (5 mg total) by mouth daily., Disp: 90 tablet, Rfl: 1   aspirin EC 81 MG tablet, Take 81 mg by mouth daily., Disp: , Rfl:    atorvastatin (LIPITOR) 20 MG tablet, Take 1 tablet (20 mg total) by mouth daily., Disp: 90 tablet, Rfl: 3   Blood Glucose Monitoring Suppl (ONETOUCH VERIO REFLECT) w/Device KIT, Check blood sugars once daily E11.69, Disp: 1 kit, Rfl: 1   dapagliflozin propanediol (FARXIGA) 10 MG TABS tablet, TAKE 1 TABLET BY MOUTH EVERY DAY IN THE MORNING, Disp: 90 tablet, Rfl: 1   diclofenac (VOLTAREN) 75 MG EC tablet, Take 1 tablet (75 mg total) by mouth 2 (two) times daily., Disp: 60 tablet, Rfl: 1   glucose blood (ONETOUCH VERIO) test strip, Check blood sugars once daily E11.69, Disp: 100 each, Rfl: 3   Lancets (ONETOUCH DELICA PLUS [id number]) MISC, Check blood sugars once daily E11.69, Disp: 100 each, Rfl: 3   lisinopril-hydrochlorothiazide (ZESTORETIC) 20-12.5 MG tablet, Take 1 tablet by mouth daily., Disp: 90 tablet, Rfl: 2   nystatin cream (MYCOSTATIN), Apply 1 Application topically 2 (two) times daily as needed for dry skin., Disp: 30 g, Rfl: 0   Semaglutide, 2 MG/DOSE, (OZEMPIC, 2 MG/DOSE,) 8 MG/3ML SOPN, INJECT 2 MG INTO THE SKIN ONCE A WEEK., Disp: 9 mL, Rfl: 1   No Known Allergies  Review of Systems  Constitutional: Negative.   Eyes: Negative.  Negative for blurred vision.  Respiratory: Negative.    Cardiovascular: Negative.   Gastrointestinal: Negative.   Endocrine: Negative for polydipsia, polyphagia and polyuria.  Musculoskeletal: Negative.   Skin: Negative.   Neurological: Negative.   Psychiatric/Behavioral: Negative.       Today's Vitals   10/04/22 0829  BP: 122/70  Pulse: 60  Temp: 98.2 F (36.8 C)  Weight: 182 lb 6.4 oz (82.7 kg)  Height: 5\' 1"  (1.549 m)   PainSc: 0-No pain   Body mass index is 34.46 kg/m.  Wt Readings from Last 3 Encounters:  10/04/22 182 lb 6.4 oz (82.7 kg)  06/14/22 182 lb 6.4 oz (82.7 kg)  05/30/22 184 lb (83.5 kg)     Objective:  Physical Exam Vitals and nursing note reviewed.  Constitutional:      Appearance: Normal appearance. She is obese.  HENT:     Head: Normocephalic and atraumatic.     Nose:     Comments: Masked     Mouth/Throat:     Comments: Masked  Eyes:     Extraocular Movements: Extraocular movements intact.  Cardiovascular:     Rate and Rhythm: Normal rate and regular rhythm.     Heart sounds: Normal heart sounds.  Pulmonary:     Effort: Pulmonary effort is normal.     Breath sounds: Normal breath sounds.  Musculoskeletal:     Cervical back: Normal range of motion.  Skin:    General: Skin is warm.  Neurological:     General: No focal deficit present.     Mental Status: She is alert.  Psychiatric:        Mood and Affect: Mood normal.        Behavior: Behavior normal.       Assessment And Plan:     1. Type 2 diabetes mellitus with stage 2 chronic kidney disease, without long-term current use of insulin (HCC) Comments: Chronic, I will check labs as stated below.  She will c/w Farxiga 10mg  and Ozempic 2mg  weekly. F/u in 3-4 months. - Hemoglobin A1c - BMP8+eGFR - Amb Ref to Medical Weight Management  2. Hypertensive nephropathy Comments: Chronic, well controlled. She will c/w amlodipine 5 qpm and lisinopril/hctz 20/12.5mg  daily. - Amb Ref to Medical Weight Management  3. Class 1 obesity due to excess calories with serious comorbidity and body mass index (BMI) of 34.0 to 34.9 in adult Comments: She is encouraged to aim for at least 150 minutes of exercise/week, while striving for BMI<30 to decrease cardiac risk  Agrees to [facility] referral. - Amb Ref to Medical Weight Management    Patient was given opportunity to ask questions. Patient verbalized understanding of the plan and was able  to repeat key elements of the plan. All questions were answered to their satisfaction.   I, [name], MD, have reviewed all documentation for this visit. The documentation on 10/04/22 for the exam, diagnosis, procedures, and orders are all accurate and complete.   IF YOU HAVE BEEN REFERRED TO A SPECIALIST, IT MAY TAKE 1-2 WEEKS TO SCHEDULE/PROCESS THE REFERRAL. IF YOU HAVE NOT HEARD FROM US/SPECIALIST IN TWO WEEKS, PLEASE GIVE [place] A CALL AT [phone].   THE PATIENT IS ENCOURAGED TO PRACTICE SOCIAL DISTANCING DUE TO THE COVID-19 PANDEMIC.

## 2022-10-05 LAB — BMP8+EGFR
BUN/Creatinine Ratio: 13 (ref 12–28)
BUN: 12 mg/dL (ref 8–27)
CO2: 21 mmol/L (ref 20–29)
Calcium: 9.6 mg/dL (ref 8.7–10.3)
Chloride: 102 mmol/L (ref 96–106)
Creatinine, Ser: 0.92 mg/dL (ref 0.57–1.00)
Glucose: 99 mg/dL (ref 70–99)
Potassium: 3.9 mmol/L (ref 3.5–5.2)
Sodium: 141 mmol/L (ref 134–144)
eGFR: 68 mL/min/{1.73_m2} (ref >59)

## 2022-10-05 LAB — HEMOGLOBIN A1C
Est. average glucose Bld gHb Est-mCnc: 146 mg/dL
Hgb A1c MFr Bld: 6.7 % — ABNORMAL HIGH (ref 4.8–5.6)

## 2022-10-05 LAB — HM DIABETES EYE EXAM

## 2022-10-07 ENCOUNTER — Other Ambulatory Visit: Payer: Self-pay | Admitting: Internal Medicine

## 2022-10-09 ENCOUNTER — Encounter: Payer: Self-pay | Admitting: Internal Medicine

## 2022-10-18 ENCOUNTER — Ambulatory Visit (INDEPENDENT_AMBULATORY_CARE_PROVIDER_SITE_OTHER): Payer: Medicare HMO

## 2022-10-18 VITALS — Ht 61.0 in | Wt 179.0 lb

## 2022-10-18 DIAGNOSIS — Z0000 Encounter for general adult medical examination without abnormal findings: Secondary | ICD-10-CM

## 2022-10-18 NOTE — Patient Instructions (Signed)
Monica Ryan , Thank you for taking time to come for your Medicare Wellness Visit. I appreciate your ongoing commitment to your health goals. Please review the following plan we discussed and let me know if I can assist you in the future.   These are the goals we discussed:  Goals      Manage My Medicine     Timeframe:  Long-Range Goal Priority:  High Start Date:                             Expected End Date:                       Follow Up Date 04/25/2022  In Progress:   - call for medicine refill 2 or 3 days before it runs out - call if I am sick and can't take my medicine - keep a list of all the medicines I take; vitamins and herbals too - use a pillbox to sort medicine - use an alarm clock or phone to remind me to take my medicine     Why is this important?   These steps will help you keep on track with your medicines.        Patient Stated     10/11/2021, continue to lose weight and keep A1C under 7     Patient Stated     10/18/2022, keep losing weight and stay mobile     Weight (lb) < 200 lb (90.7 kg)     She would like to lose 30  pounds by December 2022.   Plans to resume her regular walking regimen, increase water intake.   Incorporate more vegetables into her diet.         This is a list of the screening recommended for you and due dates:  Health Maintenance  Topic Date Due   Yearly kidney health urinalysis for diabetes  10/11/2022   Complete foot exam   10/30/2022   DTaP/Tdap/Td vaccine (3 - Tdap) 02/07/2023   Hemoglobin A1C  04/04/2023   Yearly kidney function blood test for diabetes  10/05/2023   Eye exam for diabetics  10/06/2023   Medicare Annual Wellness Visit  10/19/2023   Mammogram  05/08/2024   Colon Cancer Screening  03/03/2031   Pneumonia Vaccine  Completed   Flu Shot  Completed   DEXA scan (bone density measurement)  Completed   COVID-19 Vaccine  Completed   Hepatitis C Screening: USPSTF Recommendation to screen - Ages 11-79 yo.  Completed    Zoster (Shingles) Vaccine  Completed   HPV Vaccine  Aged Out    Advanced directives: Please bring a copy of your POA (Power of [place]) and/or Living Will to your next appointment.   Conditions/risks identified: none  Next appointment: Follow up in one year for your annual wellness visit    Preventive Care 65 Years and Older, Female Preventive care refers to lifestyle choices and visits with your health care provider that can promote health and wellness. What does preventive care include? A yearly physical exam. This is also called an annual well check. Dental exams once or twice a year. Routine eye exams. Ask your health care provider how often you should have your eyes checked. Personal lifestyle choices, including: Daily care of your teeth and gums. Regular physical activity. Eating a healthy diet. Avoiding tobacco and drug use. Limiting alcohol use. Practicing safe sex. Taking low-dose aspirin every  day. Taking vitamin and mineral supplements as recommended by your health care provider. What happens during an annual well check? The services and screenings done by your health care provider during your annual well check will depend on your age, overall health, lifestyle risk factors, and family history of disease. Counseling  Your health care provider may ask you questions about your: Alcohol use. Tobacco use. Drug use. Emotional well-being. Home and relationship well-being. Sexual activity. Eating habits. History of falls. Memory and ability to understand (cognition). Work and work Statistician. Reproductive health. Screening  You may have the following tests or measurements: Height, weight, and BMI. Blood pressure. Lipid and cholesterol levels. These may be checked every 5 years, or more frequently if you are over 5 years old. Skin check. Lung cancer screening. You may have this screening every year starting at age 78 if you have a 30-pack-year history of  smoking and currently smoke or have quit within the past 15 years. Fecal occult blood test (FOBT) of the stool. You may have this test every year starting at age 67. Flexible sigmoidoscopy or colonoscopy. You may have a sigmoidoscopy every 5 years or a colonoscopy every 10 years starting at age 77. Hepatitis C blood test. Hepatitis B blood test. Sexually transmitted disease (STD) testing. Diabetes screening. This is done by checking your blood sugar (glucose) after you have not eaten for a while (fasting). You may have this done every 1-3 years. Bone density scan. This is done to screen for osteoporosis. You may have this done starting at age 2. Mammogram. This may be done every 1-2 years. Talk to your health care provider about how often you should have regular mammograms. Talk with your health care provider about your test results, treatment options, and if necessary, the need for more tests. Vaccines  Your health care provider may recommend certain vaccines, such as: Influenza vaccine. This is recommended every year. Tetanus, diphtheria, and acellular pertussis (Tdap, Td) vaccine. You may need a Td booster every 10 years. Zoster vaccine. You may need this after age 76. Pneumococcal 13-valent conjugate (PCV13) vaccine. One dose is recommended after age 86. Pneumococcal polysaccharide (PPSV23) vaccine. One dose is recommended after age 28. Talk to your health care provider about which screenings and vaccines you need and how often you need them. This information is not intended to replace advice given to you by your health care provider. Make sure you discuss any questions you have with your health care provider. Document Released: 10/07/2015 Document Revised: 05/30/2016 Document Reviewed: 07/12/2015 Elsevier Interactive Patient Education  2017 [place] Prevention in the Home Falls can cause injuries. They can happen to people of all ages. There are many things you can do to make  your home safe and to help prevent falls. What can I do on the outside of my home? Regularly fix the edges of walkways and driveways and fix any cracks. Remove anything that might make you trip as you walk through a door, such as a raised step or threshold. Trim any bushes or trees on the path to your home. Use bright outdoor lighting. Clear any walking paths of anything that might make someone trip, such as rocks or tools. Regularly check to see if handrails are loose or broken. Make sure that both sides of any steps have handrails. Any raised decks and porches should have guardrails on the edges. Have any leaves, snow, or ice cleared regularly. Use sand or salt on walking paths during winter. Clean  up any spills in your garage right away. This includes oil or grease spills. What can I do in the bathroom? Use night lights. Install grab bars by the toilet and in the tub and shower. Do not use towel bars as grab bars. Use non-skid mats or decals in the tub or shower. If you need to sit down in the shower, use a plastic, non-slip stool. Keep the floor dry. Clean up any water that spills on the floor as soon as it happens. Remove soap buildup in the tub or shower regularly. Attach bath mats securely with double-sided non-slip rug tape. Do not have throw rugs and other things on the floor that can make you trip. What can I do in the bedroom? Use night lights. Make sure that you have a light by your bed that is easy to reach. Do not use any sheets or blankets that are too big for your bed. They should not hang down onto the floor. Have a firm chair that has side arms. You can use this for support while you get dressed. Do not have throw rugs and other things on the floor that can make you trip. What can I do in the kitchen? Clean up any spills right away. Avoid walking on wet floors. Keep items that you use a lot in easy-to-reach places. If you need to reach something above you, use a  strong step stool that has a grab bar. Keep electrical cords out of the way. Do not use floor polish or wax that makes floors slippery. If you must use wax, use non-skid floor wax. Do not have throw rugs and other things on the floor that can make you trip. What can I do with my stairs? Do not leave any items on the stairs. Make sure that there are handrails on both sides of the stairs and use them. Fix handrails that are broken or loose. Make sure that handrails are as long as the stairways. Check any carpeting to make sure that it is firmly attached to the stairs. Fix any carpet that is loose or worn. Avoid having throw rugs at the top or bottom of the stairs. If you do have throw rugs, attach them to the floor with carpet tape. Make sure that you have a light switch at the top of the stairs and the bottom of the stairs. If you do not have them, ask someone to add them for you. What else can I do to help prevent falls? Wear shoes that: Do not have high heels. Have rubber bottoms. Are comfortable and fit you well. Are closed at the toe. Do not wear sandals. If you use a stepladder: Make sure that it is fully opened. Do not climb a closed stepladder. Make sure that both sides of the stepladder are locked into place. Ask someone to hold it for you, if possible. Clearly mark and make sure that you can see: Any grab bars or handrails. First and last steps. Where the edge of each step is. Use tools that help you move around (mobility aids) if they are needed. These include: Canes. Walkers. Scooters. Crutches. Turn on the lights when you go into a dark area. Replace any light bulbs as soon as they burn out. Set up your furniture so you have a clear path. Avoid moving your furniture around. If any of your floors are uneven, fix them. If there are any pets around you, be aware of where they are. Review your medicines with your  doctor. Some medicines can make you feel dizzy. This can  increase your chance of falling. Ask your doctor what other things that you can do to help prevent falls. This information is not intended to replace advice given to you by your health care provider. Make sure you discuss any questions you have with your health care provider. Document Released: 07/07/2009 Document Revised: 02/16/2016 Document Reviewed: 10/15/2014 Elsevier Interactive Patient Education  2017 [employer].

## 2022-10-18 NOTE — Progress Notes (Signed)
I connected with Monica Ryan today by telephone and verified that I am speaking with the correct person using two identifiers. Location patient: home Location provider: work Persons participating in the virtual visit: Monica Ryan, Elisha Ponder LPN.   I discussed the limitations, risks, security and privacy concerns of performing an evaluation and management service by telephone and the availability of in person appointments. I also discussed with the patient that there may be a patient responsible charge related to this service. The patient expressed understanding and verbally consented to this telephonic visit.    Interactive audio and video telecommunications were attempted between this provider and patient, however failed, due to patient having technical difficulties OR patient did not have access to video capability.  We continued and completed visit with audio only.     Vital signs may be patient reported or missing.  Subjective:   Monica Ryan is a 67 y.o. female who presents for Medicare Annual (Subsequent) preventive examination.  Review of Systems     Cardiac Risk Factors include: advanced age (>21men, >55 women);diabetes mellitus;hypertension;obesity (BMI >30kg/m2)     Objective:    Today's Vitals   10/18/22 0811  Weight: 179 lb (81.2 kg)  Height: 5\' 1"  (1.549 m)   Body mass index is 33.82 kg/m.     10/18/2022    8:16 AM 10/11/2021    8:32 AM 10/04/2020   11:35 AM 07/24/2012    9:33 AM  Advanced Directives  Does Patient Have a Medical Advance Directive? Yes Yes No Patient does not have advance directive  Type of Advance Directive Healthcare Power of [place];Living will Healthcare Power of [place];Living will    Does patient want to make changes to medical advance directive?   Yes (MAU/Ambulatory/Procedural Areas - Information given)   Copy of Healthcare Power of Attorney in Chart? No - copy requested No - copy requested    Would patient like information on  creating a medical advance directive?   Yes (MAU/Ambulatory/Procedural Areas - Information given)     Current Medications (verified) Outpatient Encounter Medications as of 10/18/2022  Medication Sig   Alcohol Swabs (ALCOHOL PADS) 70 % PADS Use as instructed to check blood sugars 1 time per day dx: e11.65   amLODipine (NORVASC) 5 MG tablet Take 1 tablet (5 mg total) by mouth daily.   aspirin EC 81 MG tablet Take 81 mg by mouth daily.   atorvastatin (LIPITOR) 20 MG tablet TAKE 1 TABLET BY MOUTH EVERY DAY   Blood Glucose Monitoring Suppl (ONETOUCH VERIO REFLECT) w/Device KIT Check blood sugars once daily E11.69   dapagliflozin propanediol (FARXIGA) 10 MG TABS tablet TAKE 1 TABLET BY MOUTH EVERY DAY IN THE MORNING   diclofenac (VOLTAREN) 75 MG EC tablet Take 1 tablet (75 mg total) by mouth 2 (two) times daily. (Patient taking differently: Take 75 mg by mouth 2 (two) times daily. As needed)   glucose blood (ONETOUCH VERIO) test strip Check blood sugars once daily E11.69   Lancets (ONETOUCH DELICA PLUS LANCET33G) MISC Check blood sugars once daily E11.69   lisinopril-hydrochlorothiazide (ZESTORETIC) 20-12.5 MG tablet Take 1 tablet by mouth daily.   nystatin cream (MYCOSTATIN) Apply 1 Application topically 2 (two) times daily as needed for dry skin.   Semaglutide, 2 MG/DOSE, (OZEMPIC, 2 MG/DOSE,) 8 MG/3ML SOPN INJECT 2 MG INTO THE SKIN ONCE A WEEK.   No facility-administered encounter medications on file as of 10/18/2022.    Allergies (verified) Patient has no known allergies.   History: Past Medical  History:  Diagnosis Date   Diabetes mellitus without complication ([place])    Hyperlipemia    Hypertension    Past Surgical History:  Procedure Laterality Date   BREAST BIOPSY Right 2000   BREAST EXCISIONAL BIOPSY Bilateral 06/14/1999   benign   BREAST EXCISIONAL BIOPSY Left 1974   benign   COLONOSCOPY     SHOULDER ARTHROSCOPY WITH ROTATOR CUFF REPAIR AND SUBACROMIAL DECOMPRESSION  07/29/2012    Procedure: SHOULDER ARTHROSCOPY WITH ROTATOR CUFF REPAIR AND SUBACROMIAL DECOMPRESSION;  Surgeon: Cammie Sickle., MD;  Location: [facility];  Service: Orthopedics;  Laterality: Right;  Arthroscopy Subacromial Decompression, Distal Clavicle Resection, Arthroscopic Subscapularis Repair, Open Repair of Supraspinatus and Infraspinatus     TUBAL LIGATION     TUMOR EXCISION     right jaw   Family History  Problem Relation Age of Onset   Diabetes Mother    Alzheimer's disease Father    Diabetes Sister    Diabetes Brother    Social History   Socioeconomic History   Marital status: Single    Spouse name: Not on file   Number of children: Not on file   Years of education: Not on file   Highest education level: Not on file  Occupational History   Not on file  Tobacco Use   Smoking status: Never   Smokeless tobacco: Never   Tobacco comments:    n/a  Vaping Use   Vaping Use: Never used  Substance and Sexual Activity   Alcohol use: Not Currently    Comment: occ   Drug use: Never   Sexual activity: Not Currently  Other Topics Concern   Not on file  Social History Narrative   Not on file   Social Determinants of Health   Financial Resource Strain: Low Risk  (10/18/2022)   Overall Financial Resource Strain (CARDIA)    Difficulty of Paying Living Expenses: Not hard at all  Food Insecurity: No Food Insecurity (10/18/2022)   Hunger Vital Sign    Worried About Running Out of Food in the Last Year: Never true    Ran Out of Food in the Last Year: Never true  Transportation Needs: No Transportation Needs (10/18/2022)   PRAPARE - Hydrologist (Medical): No    Lack of Transportation (Non-Medical): No  Physical Activity: Sufficiently Active (10/18/2022)   Exercise Vital Sign    Days of Exercise per Week: 4 days    Minutes of Exercise per Session: 50 min  Stress: No Stress Concern Present (10/18/2022)   [place]    Feeling of Stress : Not at all  Social Connections: Moderately Integrated (10/04/2020)   Social Connection and Isolation Panel [NHANES]    Frequency of Communication with Friends and Family: More than three times a week    Frequency of Social Gatherings with Friends and Family: Twice a week    Attends Religious Services: More than 4 times per year    Active Member of [employer] or Organizations: Yes    Attends Music therapist: More than 4 times per year    Marital Status: Divorced    Tobacco Counseling Counseling given: Not Answered Tobacco comments: n/a   Clinical Intake:  Pre-visit preparation completed: Yes  Pain : No/denies pain     Nutritional Status: BMI > 30  Obese Nutritional Risks: None Diabetes: Yes  How often do you need to have someone help you when  you read instructions, pamphlets, or other written materials from your doctor or pharmacy?: 1 - Never  Diabetic? Yes Nutrition Risk Assessment:  Has the patient had any N/V/D within the last 2 months?  No  Does the patient have any non-healing wounds?  No  Has the patient had any unintentional weight loss or weight gain?  No   Diabetes:  Is the patient diabetic?  Yes  If diabetic, was a CBG obtained today?  No  Did the patient bring in their glucometer from home?  No  How often do you monitor your CBG's? daily.   Financial Strains and Diabetes Management:  Are you having any financial strains with the device, your supplies or your medication? No .  Does the patient want to be seen by Chronic Care Management for management of their diabetes?  No  Would the patient like to be referred to a Nutritionist or for Diabetic Management?  No   Diabetic Exams:  Diabetic Eye Exam: Completed 10/05/2022 Diabetic Foot Exam: Completed 10/30/2021   Interpreter Needed?: No  Information entered by :: NAllen LPN   Activities of Daily Living    10/18/2022    8:16 AM   In your present state of health, do you have any difficulty performing the following activities:  Hearing? 0  Vision? 0  Difficulty concentrating or making decisions? 0  Walking or climbing stairs? 0  Dressing or bathing? 0  Doing errands, shopping? 0  Preparing Food and eating ? N  Using the Toilet? N  In the past six months, have you accidently leaked urine? N  Do you have problems with loss of bowel control? N  Managing your Medications? N  Managing your Finances? N  Housekeeping or managing your Housekeeping? N    Patient Care Team: [name], MD as PCP - General (Internal Medicine) [facility]  Indicate any recent Medical Services you may have received from other than [facility] providers in the past year (date may be approximate).     Assessment:   This is a routine wellness examination for Monica Ryan.  Hearing/Vision screen Vision Screening - Comments:: Regular eye exams, [facility]  Dietary issues and exercise activities discussed: Current Exercise Habits: Structured exercise class, Type of exercise: strength training/weights;stretching, Time (Minutes): 45, Frequency (Times/Week): 4, Weekly Exercise (Minutes/Week): 180   Goals Addressed             This Visit's Progress    Patient Stated       10/18/2022, keep losing weight and stay mobile       Depression Screen    10/18/2022    8:16 AM 10/11/2021    8:33 AM 10/04/2020   11:18 AM 07/15/2019    8:40 AM 04/14/2019    2:20 PM 01/13/2019    8:59 AM 09/30/2018    8:44 AM  PHQ 2/9 Scores  PHQ - 2 Score 0 0 0 0 0 0 0    Fall Risk    10/18/2022    8:16 AM 10/11/2021    8:33 AM 10/04/2020   11:18 AM 07/15/2019    8:40 AM 04/14/2019    2:20 PM  [place] in the past year? 0 0 0 0 0  Number falls in past yr: 0  0    Injury with Fall? 0  0    Risk for fall due to : Medication side effect Medication side effect     Follow up Falls prevention discussed;Education provided;Falls  evaluation  completed Falls evaluation completed;Education provided;Falls prevention discussed       FALL RISK PREVENTION PERTAINING TO THE HOME:  Any stairs in or around the home? Yes  If so, are there any without handrails? No  Home free of loose throw rugs in walkways, pet beds, electrical cords, etc? Yes  Adequate lighting in your home to reduce risk of falls? Yes   ASSISTIVE DEVICES UTILIZED TO PREVENT FALLS:  Life alert? No  Use of a cane, walker or w/c? No  Grab bars in the bathroom? No  Shower chair or bench in shower? No  Elevated toilet seat or a handicapped toilet? Yes   TIMED UP AND GO:  Was the test performed? No .      Cognitive Function:    10/04/2020   11:19 AM  MMSE - Mini Mental State Exam  Orientation to time 5  Orientation to Place 5  Registration 3  Attention/ Calculation 5  Recall 3  Language- name 2 objects 2  Language- repeat 1  Language- follow 3 step command 3  Language- read & follow direction 1  Write a sentence 1  Copy design 1  Total score 30        10/18/2022    8:17 AM 10/11/2021    8:34 AM 10/04/2020   11:18 AM 10/04/2020   11:17 AM  6CIT Screen  What Year? 0 points 0 points 0 points 0 points  What month? 0 points 0 points 0 points 0 points  What time? 0 points 0 points 0 points 0 points  Count back from 20 0 points 0 points 0 points   Months in reverse 0 points 0 points 0 points   Repeat phrase 0 points 2 points 4 points   Total Score 0 points 2 points 4 points     Immunizations Immunization History  Administered Date(s) Administered   Covid-19, Mrna,Vaccine(Spikevax)28yrs and older 09/26/2022   DTaP 02/06/2013   Fluad Quad(high Dose 65+) 10/04/2020, 06/14/2022   Influenza, High Dose Seasonal PF 06/07/2021   Influenza,inj,Quad PF,6+ Mos 07/15/2019   Influenza-Unspecified 06/17/2018   Moderna SARS-COV2 Booster Vaccination 07/26/2020   Moderna Sars-Covid-2 Vaccination 10/17/2019, 11/21/2019, 03/13/2021   PNEUMOCOCCAL CONJUGATE-20  06/07/2021   Pfizer Covid-19 Vaccine Bivalent Booster 6yrs & up 07/10/2021   Pneumococcal Conjugate-13 01/16/2020   Pneumococcal-Unspecified 04/20/2014   Td 07/10/2006   Zoster Recombinat (Shingrix) 04/23/2021, 06/30/2021    TDAP status: Up to date  Flu Vaccine status: Up to date  Pneumococcal vaccine status: Up to date  Covid-19 vaccine status: Completed vaccines  Qualifies for Shingles Vaccine? Yes   Zostavax completed Yes   Shingrix Completed?: Yes  Screening Tests Health Maintenance  Topic Date Due   Diabetic kidney evaluation - Urine ACR  10/11/2022   Medicare Annual Wellness (AWV)  10/11/2022   FOOT EXAM  10/30/2022   DTaP/Tdap/Td (3 - Tdap) 02/07/2023   HEMOGLOBIN A1C  04/04/2023   Diabetic kidney evaluation - eGFR measurement  10/05/2023   OPHTHALMOLOGY EXAM  10/06/2023   MAMMOGRAM  05/08/2024   COLONOSCOPY (Pts 45-44yrs Insurance coverage will need to be confirmed)  03/03/2031   Pneumonia Vaccine 64+ Years old  Completed   INFLUENZA VACCINE  Completed   DEXA SCAN  Completed   COVID-19 Vaccine  Completed   Hepatitis C Screening  Completed   Zoster Vaccines- Shingrix  Completed   HPV VACCINES  Aged Out    Health Maintenance  Health Maintenance Due  Topic Date Due   Diabetic  kidney evaluation - Urine ACR  10/11/2022   Medicare Annual Wellness (AWV)  10/11/2022    Colorectal cancer screening: Type of screening: Colonoscopy. Completed 03/02/2021. Repeat every 10 years  Mammogram status: Completed 05/08/2022. Repeat every year  Bone Density status: Completed 06/28/2020.   Lung Cancer Screening: (Low Dose CT Chest recommended if Age 76-80 years, 30 pack-year currently smoking OR have quit w/in 15years.) does not qualify.   Lung Cancer Screening Referral: no  Additional Screening:  Hepatitis C Screening: does qualify; Completed 02/06/2013  Vision Screening: Recommended annual ophthalmology exams for early detection of glaucoma and other disorders of the  eye. Is the patient up to date with their annual eye exam?  Yes  Who is the provider or what is the name of the office in which the patient attends annual eye exams? [facility] If pt is not established with a provider, would they like to be referred to a provider to establish care? No .   Dental Screening: Recommended annual dental exams for proper oral hygiene  Community Resource Referral / Chronic Care Management: CRR required this visit?  No   CCM required this visit?  No      Plan:     I have personally reviewed and noted the following in the patient's chart:   Medical and social history Use of alcohol, tobacco or illicit drugs  Current medications and supplements including opioid prescriptions. Patient is not currently taking opioid prescriptions. Functional ability and status Nutritional status Physical activity Advanced directives List of other physicians Hospitalizations, surgeries, and ER visits in previous 12 months Vitals Screenings to include cognitive, depression, and falls Referrals and appointments  In addition, I have reviewed and discussed with patient certain preventive protocols, quality metrics, and best practice recommendations. A written personalized care plan for preventive services as well as general preventive health recommendations were provided to patient.     Barb Merino, LPN   12/07/4006   Nurse Notes: none  Due to this being a virtual visit, the after visit summary with patients personalized plan was offered to patient via mail or my-chart. Patient would like to access on my-chart

## 2022-10-21 ENCOUNTER — Other Ambulatory Visit: Payer: Self-pay | Admitting: Internal Medicine

## 2022-11-16 DIAGNOSIS — H2511 Age-related nuclear cataract, right eye: Secondary | ICD-10-CM | POA: Diagnosis not present

## 2022-11-16 DIAGNOSIS — H18413 Arcus senilis, bilateral: Secondary | ICD-10-CM | POA: Diagnosis not present

## 2022-11-16 DIAGNOSIS — H25013 Cortical age-related cataract, bilateral: Secondary | ICD-10-CM | POA: Diagnosis not present

## 2022-11-16 DIAGNOSIS — H2513 Age-related nuclear cataract, bilateral: Secondary | ICD-10-CM | POA: Diagnosis not present

## 2022-11-16 DIAGNOSIS — H25043 Posterior subcapsular polar age-related cataract, bilateral: Secondary | ICD-10-CM | POA: Diagnosis not present

## 2022-11-19 ENCOUNTER — Encounter: Payer: No Typology Code available for payment source | Admitting: Internal Medicine

## 2022-11-27 ENCOUNTER — Other Ambulatory Visit (HOSPITAL_COMMUNITY)
Admission: RE | Admit: 2022-11-27 | Discharge: 2022-11-27 | Disposition: A | Discharge status: Home / Self Care | Payer: Medicare HMO | Source: Ambulatory Visit | Attending: Internal Medicine | Admitting: Internal Medicine

## 2022-11-27 ENCOUNTER — Encounter: Payer: Self-pay | Admitting: Internal Medicine

## 2022-11-27 ENCOUNTER — Ambulatory Visit (INDEPENDENT_AMBULATORY_CARE_PROVIDER_SITE_OTHER): Payer: Medicare HMO | Admitting: Internal Medicine

## 2022-11-27 VITALS — BP 120/78 | HR 71 | Temp 98.1°F | Ht 61.0 in | Wt 184.0 lb

## 2022-11-27 DIAGNOSIS — Z01419 Encounter for gynecological examination (general) (routine) without abnormal findings: Secondary | ICD-10-CM

## 2022-11-27 DIAGNOSIS — E6609 Other obesity due to excess calories: Secondary | ICD-10-CM

## 2022-11-27 DIAGNOSIS — G8929 Other chronic pain: Secondary | ICD-10-CM

## 2022-11-27 DIAGNOSIS — M25512 Pain in left shoulder: Secondary | ICD-10-CM

## 2022-11-27 DIAGNOSIS — N841 Polyp of cervix uteri: Secondary | ICD-10-CM | POA: Diagnosis not present

## 2022-11-27 DIAGNOSIS — Z1151 Encounter for screening for human papillomavirus (HPV): Secondary | ICD-10-CM | POA: Insufficient documentation

## 2022-11-27 DIAGNOSIS — I129 Hypertensive chronic kidney disease with stage 1 through stage 4 chronic kidney disease, or unspecified chronic kidney disease: Secondary | ICD-10-CM | POA: Diagnosis not present

## 2022-11-27 DIAGNOSIS — E2839 Other primary ovarian failure: Secondary | ICD-10-CM | POA: Diagnosis not present

## 2022-11-27 DIAGNOSIS — Z6834 Body mass index (BMI) 34.0-34.9, adult: Secondary | ICD-10-CM

## 2022-11-27 DIAGNOSIS — E1122 Type 2 diabetes mellitus with diabetic chronic kidney disease: Secondary | ICD-10-CM | POA: Diagnosis not present

## 2022-11-27 DIAGNOSIS — N182 Chronic kidney disease, stage 2 (mild): Secondary | ICD-10-CM

## 2022-11-27 DIAGNOSIS — Z0001 Encounter for general adult medical examination with abnormal findings: Secondary | ICD-10-CM

## 2022-11-27 DIAGNOSIS — Z0000 Encounter for general adult medical examination without abnormal findings: Secondary | ICD-10-CM

## 2022-11-27 LAB — POCT URINALYSIS DIPSTICK
Bilirubin, UA: NEGATIVE
Glucose, UA: POSITIVE — AB
Ketones, UA: NEGATIVE
Leukocytes, UA: NEGATIVE
Nitrite, UA: NEGATIVE
Protein, UA: NEGATIVE
Spec Grav, UA: >=1.03 — AB (ref 1.010–1.025)
Urobilinogen, UA: 0.2 E.U./dL
pH, UA: 5.5 (ref 5.0–8.0)

## 2022-11-27 LAB — POC HEMOCCULT BLD/STL (OFFICE/1-CARD/DIAGNOSTIC): Fecal Occult Blood, POC: NEGATIVE

## 2022-11-27 NOTE — Progress Notes (Signed)
I,Victoria T Hamilton,acting as a scribe for [name], MD.,have documented all relevant documentation on the behalf of [name], MD,as directed by  [name], MD while in the presence of [name], MD.   Subjective:     Patient ID: Monica Ryan , female    DOB: [birth year] , 67 y.o.   MRN: [id number]   Chief Complaint  Patient presents with   Annual Exam    HPI  Patient presents for her physical. She does not have a GYN provider, he has retired. She is scheduled to have pap smear today.  She reports compliance with medications. She denies having any headaches, chest pain and shortness of breath. She adds, her ophthalmologist, Dr Darcey Nora recommended she have cataract surgery.    Diabetes She presents for her follow-up diabetic visit. She has type 2 diabetes mellitus. Her disease course has been stable. There are no hypoglycemic associated symptoms. Pertinent negatives for diabetes include no blurred vision. There are no hypoglycemic complications. Risk factors for coronary artery disease include diabetes mellitus, dyslipidemia, hypertension and post-menopausal. She is following a diabetic diet. She participates in exercise three times a week. Her breakfast blood glucose is taken between 7-8 am. Her breakfast blood glucose range is generally 90-110 mg/dl. An ACE inhibitor/angiotensin II receptor blocker is being taken. Eye exam is current.  Hypertension This is a chronic problem. The current episode started more than 1 year ago. The problem is controlled. Pertinent negatives include no blurred vision. Risk factors for coronary artery disease include diabetes mellitus, dyslipidemia and obesity. The current treatment provides moderate improvement.     Past Medical History:  Diagnosis Date   Diabetes mellitus without complication ([place])    Hyperlipemia    Hypertension      Family History  Problem Relation Age of Onset   Diabetes Mother    Alzheimer's disease  Father    Diabetes Sister    Diabetes Brother      Current Outpatient Medications:    Alcohol Swabs (ALCOHOL PADS) 70 % PADS, Use as instructed to check blood sugars 1 time per day dx: e11.65, Disp: 150 each, Rfl: 2   amLODipine (NORVASC) 5 MG tablet, Take 1 tablet (5 mg total) by mouth daily., Disp: 90 tablet, Rfl: 1   aspirin EC 81 MG tablet, Take 81 mg by mouth daily., Disp: , Rfl:    atorvastatin (LIPITOR) 20 MG tablet, TAKE 1 TABLET BY MOUTH EVERY DAY, Disp: 90 tablet, Rfl: 3   Blood Glucose Monitoring Suppl (ONETOUCH VERIO REFLECT) w/Device KIT, Check blood sugars once daily E11.69, Disp: 1 kit, Rfl: 1   dapagliflozin propanediol (FARXIGA) 10 MG TABS tablet, TAKE 1 TABLET BY MOUTH EVERY DAY IN THE MORNING, Disp: 90 tablet, Rfl: 1   diclofenac (VOLTAREN) 75 MG EC tablet, Take 1 tablet (75 mg total) by mouth 2 (two) times daily. (Patient taking differently: Take 75 mg by mouth 2 (two) times daily. As needed), Disp: 60 tablet, Rfl: 1   glucose blood (ONETOUCH VERIO) test strip, Check blood sugars once daily E11.69, Disp: 100 each, Rfl: 3   Lancets (ONETOUCH DELICA PLUS 123XX123) MISC, Check blood sugars once daily E11.69, Disp: 100 each, Rfl: 3   lisinopril-hydrochlorothiazide (ZESTORETIC) 20-12.5 MG tablet, TAKE 1 TABLET EVERY DAY, Disp: 90 tablet, Rfl: 3   nystatin cream (MYCOSTATIN), Apply 1 Application topically 2 (two) times daily as needed for dry skin., Disp: 30 g, Rfl: 0   Semaglutide, 2 MG/DOSE, (OZEMPIC, 2  MG/DOSE,) 8 MG/3ML SOPN, INJECT 2 MG INTO THE SKIN ONCE A WEEK., Disp: 9 mL, Rfl: 1   No Known Allergies    The patient states she uses post menopausal status for birth control. Last LMP was No LMP recorded. Patient is postmenopausal.. Negative for Dysmenorrhea. Negative for: breast discharge, breast lump(s), breast pain and breast self exam. Associated symptoms include abnormal vaginal bleeding. Pertinent negatives include abnormal bleeding (hematology), anxiety, decreased  libido, depression, difficulty falling sleep, dyspareunia, history of infertility, nocturia, sexual dysfunction, sleep disturbances, urinary incontinence, urinary urgency, vaginal discharge and vaginal itching. Diet regular.The patient states her exercise level is    . The patient's tobacco use is:  Social History   Tobacco Use  Smoking Status Never  Smokeless Tobacco Never  Tobacco Comments   n/a  . She has been exposed to passive smoke. The patient's alcohol use is:  Social History   Substance and Sexual Activity  Alcohol Use Not Currently   Comment: occ    Review of Systems  Constitutional: Negative.   HENT: Negative.    Eyes: Negative.  Negative for blurred vision.  Respiratory: Negative.    Cardiovascular: Negative.   Gastrointestinal: Negative.   Endocrine: Negative.   Genitourinary: Negative.   Musculoskeletal: Negative.   Skin: Negative.   Allergic/Immunologic: Negative.   Neurological: Negative.   Hematological: Negative.   Psychiatric/Behavioral: Negative.       Today's Vitals   11/27/22 0825  BP: 120/78  Pulse: 71  Temp: 98.1 F (36.7 C)  SpO2: 98%  Weight: 184 lb (83.5 kg)  Height: '5\' 1"'$  (1.549 m)   Body mass index is 34.77 kg/m.  Wt Readings from Last 3 Encounters:  11/27/22 184 lb (83.5 kg)  10/18/22 179 lb (81.2 kg)  10/04/22 182 lb 6.4 oz (82.7 kg)    Objective:  Physical Exam Vitals and nursing note reviewed. Exam conducted with a chaperone present.  Constitutional:      Appearance: Normal appearance. She is obese.  HENT:     Head: Normocephalic and atraumatic.     Right Ear: Tympanic membrane, ear canal and external ear normal.     Left Ear: Tympanic membrane, ear canal and external ear normal.     Nose:     Comments: Masked     Mouth/Throat:     Comments: Masked  Eyes:     Extraocular Movements: Extraocular movements intact.     Conjunctiva/sclera: Conjunctivae normal.     Pupils: Pupils are equal, round, and reactive to light.   Cardiovascular:     Rate and Rhythm: Normal rate and regular rhythm.     Pulses: Normal pulses.          Dorsalis pedis pulses are 2+ on the right side and 2+ on the left side.     Heart sounds: Normal heart sounds.  Pulmonary:     Effort: Pulmonary effort is normal.     Breath sounds: Normal breath sounds.  Chest:  Breasts:    Tanner Score is 5.     Right: Normal.     Left: Normal.  Abdominal:     General: Abdomen is flat. Bowel sounds are normal.     Palpations: Abdomen is soft.     Hernia: There is no hernia in the left inguinal area or right inguinal area.  Genitourinary:    General: Normal vulva.     Exam position: Lithotomy position.     Tanner stage (genital): 5.     Vagina: Normal.  Cervix: Lesion present.     Uterus: Normal.      Adnexa:        Right: No tenderness.         Left: No tenderness.       Rectum: Normal. Guaiac result negative.     Comments: Cervical polyp noted Musculoskeletal:        General: Tenderness present. Normal range of motion.     Cervical back: Normal range of motion and neck supple.  Feet:     Right foot:     Protective Sensation: 5 sites tested.  5 sites sensed.     Skin integrity: Dry skin present.     Toenail Condition: Right toenails are normal.     Left foot:     Protective Sensation: 5 sites tested.  5 sites sensed.     Skin integrity: Dry skin present.     Toenail Condition: Left toenails are normal.  Skin:    General: Skin is warm and dry.  Neurological:     General: No focal deficit present.     Mental Status: She is alert and oriented to person, place, and time.  Psychiatric:        Mood and Affect: Mood normal.        Behavior: Behavior normal.         Assessment And Plan:     1. Encounter for general adult medical examination w/o abnormal findings Comments: A full exam was performed. Importance of monthly self breast exams was discussed with patient.  PATIENT IS ADVISED TO GET 30-45 MINUTES REGULAR EXERCISE NO  LESS THAN FOUR TO FIVE DAYS PER WEEK - BOTH WEIGHTBEARING EXERCISES AND AEROBIC ARE RECOMMENDED.  PATIENT IS ADVISED TO FOLLOW A HEALTHY DIET WITH AT LEAST SIX FRUITS/VEGGIES PER DAY, DECREASE INTAKE OF RED MEAT, AND TO INCREASE FISH INTAKE TO TWO DAYS PER WEEK.  MEATS/FISH SHOULD NOT BE FRIED, BAKED OR BROILED IS PREFERABLE.  IT IS ALSO IMPORTANT TO CUT BACK ON YOUR SUGAR INTAKE. PLEASE AVOID ANYTHING WITH ADDED SUGAR, CORN SYRUP OR OTHER SWEETENERS. IF YOU MUST USE A SWEETENER, YOU CAN TRY STEVIA. IT IS ALSO IMPORTANT TO AVOID ARTIFICIALLY SWEETENERS AND DIET BEVERAGES. LASTLY, I SUGGEST WEARING SPF 50 SUNSCREEN ON EXPOSED PARTS AND ESPECIALLY WHEN IN THE DIRECT SUNLIGHT FOR AN EXTENDED PERIOD OF TIME.  PLEASE AVOID FAST FOOD RESTAURANTS AND INCREASE YOUR WATER INTAKE.  2. Cervical smear, as part of routine gynecological examination Comments: Pap smear performed, polyp noted on exam. I contacted GYN, and they agree w/ referral. I will refer her to GYn for further eval. Stool is heme negative. - Cytology -Pap Smear - Ambulatory referral to Gynecology - POC Hemoccult Bld/Stl (1-Cd Office Dx)  3. Type 2 diabetes mellitus with stage 2 chronic kidney disease, without long-term current use of insulin (HCC) Comments: Chronic, diabetic foot exam was performed. She will f/u in 4 months for re-evaluation.  She will c/w Ozempic and [place]. She will f/u in 3-4 months for f/u.  I DISCUSSED WITH THE PATIENT AT LENGTH REGARDING THE GOALS OF GLYCEMIC CONTROL AND POSSIBLE LONG-TERM COMPLICATIONS.  I  ALSO STRESSED THE IMPORTANCE OF COMPLIANCE WITH HOME GLUCOSE MONITORING, DIETARY RESTRICTIONS INCLUDING AVOIDANCE OF SUGARY DRINKS/PROCESSED FOODS,  ALONG WITH REGULAR EXERCISE.  I  ALSO STRESSED THE IMPORTANCE OF ANNUAL EYE EXAMS, SELF FOOT CARE AND COMPLIANCE WITH OFFICE VISITS.  - CBC - Lipid panel - Hemoglobin A1c - Liver Profile  4. Hypertensive nephropathy Comments: Chronic, well controlled. EKG  performed, NSR  w/o acute changes. She will c/w lisinopril/hctz 10/12.'5mg'$  and amlodipine daily. F/u in 4-6 months. - POCT Urinalysis Dipstick (81002) - Microalbumin / creatinine urine ratio - EKG 12-Lead  5. Estrogen deficiency Comments: I will refer her for bone density. She is encouraged to engage in weight-bearing exercises at least 3 days weekly. - DG Bone Density; Future  6. Cervical polyp Comments: I will refer her to GYN for further evaluation. She is in agreement w/ treatment plan.  7. Chronic left shoulder pain Comments: I will refer her to Ortho for further evaluation. Sx are suggestive of rotator cuff disease. - Ambulatory referral to Orthopedic Surgery  8. Class 1 obesity due to excess calories with serious comorbidity and body mass index (BMI) of 34.0 to 34.9 in adult She is encouraged to strive for BMI less than 30 to decrease cardiac risk. Advised to aim for at least 150 minutes of exercise per week.   Patient was given opportunity to ask questions. Patient verbalized understanding of the plan and was able to repeat key elements of the plan. All questions were answered to their satisfaction.   I, [name], MD, have reviewed all documentation for this visit. The documentation on 11/27/22 for the exam, diagnosis, procedures, and orders are all accurate and complete.   THE PATIENT IS ENCOURAGED TO PRACTICE SOCIAL DISTANCING DUE TO THE COVID-19 PANDEMIC.

## 2022-11-27 NOTE — Patient Instructions (Signed)

## 2022-11-28 ENCOUNTER — Encounter: Payer: Self-pay | Admitting: Internal Medicine

## 2022-11-28 ENCOUNTER — Other Ambulatory Visit: Payer: Self-pay | Admitting: Internal Medicine

## 2022-11-28 DIAGNOSIS — R3129 Other microscopic hematuria: Secondary | ICD-10-CM

## 2022-11-28 LAB — CBC
Hematocrit: 42.7 % (ref 34.0–46.6)
Hemoglobin: 14.1 g/dL (ref 11.1–15.9)
MCH: 28.5 pg (ref 26.6–33.0)
MCHC: 33 g/dL (ref 31.5–35.7)
MCV: 86 fL (ref 79–97)
Platelets: 302 10*3/uL (ref 150–450)
RBC: 4.95 x10E6/uL (ref 3.77–5.28)
RDW: 13.2 % (ref 11.7–15.4)
WBC: 7.2 10*3/uL (ref 3.4–10.8)

## 2022-11-28 LAB — HEPATIC FUNCTION PANEL
ALT: 25 IU/L (ref 0–32)
AST: 19 IU/L (ref 0–40)
Albumin: 4.3 g/dL (ref 3.9–4.9)
Alkaline Phosphatase: 147 IU/L — ABNORMAL HIGH (ref 44–121)
Bilirubin Total: 0.5 mg/dL (ref 0.0–1.2)
Bilirubin, Direct: 0.2 mg/dL (ref 0.00–0.40)
Total Protein: 7.1 g/dL (ref 6.0–8.5)

## 2022-11-28 LAB — HEMOGLOBIN A1C
Est. average glucose Bld gHb Est-mCnc: 143 mg/dL
Hgb A1c MFr Bld: 6.6 % — ABNORMAL HIGH (ref 4.8–5.6)

## 2022-11-28 LAB — MICROALBUMIN / CREATININE URINE RATIO
Creatinine, Urine: 106.6 mg/dL
Microalb/Creat Ratio: 4 mg/g creat (ref 0–29)
Microalbumin, Urine: 3.9 ug/mL

## 2022-11-28 LAB — LIPID PANEL
Chol/HDL Ratio: 2 ratio (ref 0.0–4.4)
Cholesterol, Total: 169 mg/dL (ref 100–199)
HDL: 86 mg/dL (ref >39)
LDL Chol Calc (NIH): 66 mg/dL (ref 0–99)
Triglycerides: 95 mg/dL (ref 0–149)
VLDL Cholesterol Cal: 17 mg/dL (ref 5–40)

## 2022-11-30 LAB — CYTOLOGY - PAP
Comment: NEGATIVE
Diagnosis: NEGATIVE
High risk HPV: NEGATIVE

## 2022-12-01 ENCOUNTER — Encounter: Payer: Self-pay | Admitting: Internal Medicine

## 2022-12-03 ENCOUNTER — Telehealth: Payer: Self-pay

## 2022-12-03 ENCOUNTER — Other Ambulatory Visit: Payer: Self-pay | Admitting: Internal Medicine

## 2022-12-03 MED ORDER — DAPAGLIFLOZIN PROPANEDIOL 10 MG PO TABS: ORAL_TABLET | ORAL | 2 refills | Status: DC

## 2022-12-03 NOTE — Progress Notes (Cosign Needed)
Refill request sent to clinical team for Farxiga 90 DS 2 refills to be sent to [facility], patient received through [place] patient assistance program.   Pattricia Boss, [place] Pharmacist Assistant [phone]

## 2022-12-04 ENCOUNTER — Other Ambulatory Visit: Payer: Self-pay

## 2022-12-04 MED ORDER — DAPAGLIFLOZIN PROPANEDIOL 10 MG PO TABS: ORAL_TABLET | ORAL | 2 refills | Status: DC

## 2022-12-07 ENCOUNTER — Ambulatory Visit (INDEPENDENT_AMBULATORY_CARE_PROVIDER_SITE_OTHER): Payer: Medicare HMO | Admitting: Podiatry

## 2022-12-07 DIAGNOSIS — E1122 Type 2 diabetes mellitus with diabetic chronic kidney disease: Secondary | ICD-10-CM

## 2022-12-07 DIAGNOSIS — E119 Type 2 diabetes mellitus without complications: Secondary | ICD-10-CM | POA: Diagnosis not present

## 2022-12-07 DIAGNOSIS — N182 Chronic kidney disease, stage 2 (mild): Secondary | ICD-10-CM

## 2022-12-07 NOTE — Progress Notes (Signed)
   Chief Complaint  Patient presents with   Diabetes    Patient came in today for plantar fasciitis follow-up, arch pain, rate of pain 5 out of 10, Diabetic A1c- 6.6 BG- 118, TX: injections (seem to help)     Subjective: 67 y.o. female presenting today for routine annual foot exam.  Patient is diabetic.  Last A1c 6.6.  She does have a history of plantar fasciitis and she received injections which seem to help.  The pain is minimal now.  She is inquiring about new diabetic shoes with insoles.  Past Medical History:  Diagnosis Date   Diabetes mellitus without complication ([place])    Hyperlipemia    Hypertension    Past Surgical History:  Procedure Laterality Date   BREAST BIOPSY Right 2000   BREAST EXCISIONAL BIOPSY Bilateral 06/14/1999   benign   BREAST EXCISIONAL BIOPSY Left 1974   benign   COLONOSCOPY     SHOULDER ARTHROSCOPY WITH ROTATOR CUFF REPAIR AND SUBACROMIAL DECOMPRESSION  07/29/2012   Procedure: SHOULDER ARTHROSCOPY WITH ROTATOR CUFF REPAIR AND SUBACROMIAL DECOMPRESSION;  Surgeon: Cammie Sickle., MD;  Location: [facility];  Service: Orthopedics;  Laterality: Right;  Arthroscopy Subacromial Decompression, Distal Clavicle Resection, Arthroscopic Subscapularis Repair, Open Repair of Supraspinatus and Infraspinatus     TUBAL LIGATION     TUMOR EXCISION     right jaw   No Known Allergies   Objective: Physical Exam General: The patient is alert and oriented x3 in no acute distress.  Dermatology: Skin is warm, dry and supple bilateral lower extremities. Negative for open lesions or macerations bilateral.   Vascular: Dorsalis Pedis and Posterior Tibial pulses palpable bilateral.  Capillary fill time is immediate to all digits.  Neurological: Light touch and protective threshold intact bilateral.   Musculoskeletal: Minimal tenderness with palpation to the plantar fascia bilateral.  Muscle strength 5/5 all compartments.  No pedal deformity.  There is also  some slight tenderness to the second and third interdigital areas of the right forefoot.  Patient states that this is mostly symptomatic at night when she goes to bed.  Assessment: 1. plantar fasciitis bilateral feet; minimally symptomatic today 2.  Nocturnal neuritis right forefoot 3.  Diabetes mellitus; uncomplicated, controlled  Plan of Care:  1. Patient evaluated.  Comprehensive diabetic foot exam performed today 2.  Patient has a prescription for diclofenac 75 mg 2 times daily.  Recommend taking at least nightly before she goes to bed to see if this helps alleviate her forefoot symptoms of the right forefoot  4.  Continue wearing custom molded diabetic insoles and shoes.   5.  Appointment with diabetic shoe department for new diabetic shoes and custom molded insoles.  Patient is requesting a lower profile diabetic insole that would fit in more shoes 6.  Return to clinic annually  Edrick Kins, DPM [facility]  Dr. Edrick Kins, DPM    [address], [place] [address]                Office [phone]  Fax [phone]

## 2022-12-12 DIAGNOSIS — M67912 Unspecified disorder of synovium and tendon, left shoulder: Secondary | ICD-10-CM | POA: Diagnosis not present

## 2022-12-17 DIAGNOSIS — S46012D Strain of muscle(s) and tendon(s) of the rotator cuff of left shoulder, subsequent encounter: Secondary | ICD-10-CM | POA: Diagnosis not present

## 2022-12-19 ENCOUNTER — Encounter (INDEPENDENT_AMBULATORY_CARE_PROVIDER_SITE_OTHER): Payer: BC Managed Care – PPO | Admitting: Family Medicine

## 2022-12-20 ENCOUNTER — Encounter (INDEPENDENT_AMBULATORY_CARE_PROVIDER_SITE_OTHER): Payer: BC Managed Care – PPO | Admitting: Internal Medicine

## 2022-12-24 ENCOUNTER — Ambulatory Visit (INDEPENDENT_AMBULATORY_CARE_PROVIDER_SITE_OTHER): Payer: Medicare HMO

## 2022-12-24 DIAGNOSIS — M2141 Flat foot [pes planus] (acquired), right foot: Secondary | ICD-10-CM | POA: Diagnosis not present

## 2022-12-24 DIAGNOSIS — M722 Plantar fascial fibromatosis: Secondary | ICD-10-CM

## 2022-12-24 DIAGNOSIS — M2142 Flat foot [pes planus] (acquired), left foot: Secondary | ICD-10-CM | POA: Diagnosis not present

## 2022-12-24 DIAGNOSIS — S46012D Strain of muscle(s) and tendon(s) of the rotator cuff of left shoulder, subsequent encounter: Secondary | ICD-10-CM | POA: Diagnosis not present

## 2022-12-24 DIAGNOSIS — E1142 Type 2 diabetes mellitus with diabetic polyneuropathy: Secondary | ICD-10-CM | POA: Diagnosis not present

## 2022-12-24 NOTE — Progress Notes (Signed)
Patient presents today to be casted for custom molded orthotics. [facility] is the treating physician.  Impression foam cast was taken. ABN signed.  Patient info-  Shoe size: 7.5 W  Shoe style: ATHLETIC  Height: 5FT 1  Weight: 180 LBS  Insurance: [employer]/BC[employer]   Patient will be notified once orthotics arrive in office and reappoint for fitting at that time.

## 2022-12-26 ENCOUNTER — Ambulatory Visit
Admission: RE | Admit: 2022-12-26 | Discharge: 2022-12-26 | Disposition: A | Discharge status: Home / Self Care | Payer: Medicare HMO | Source: Ambulatory Visit | Attending: Internal Medicine | Admitting: Internal Medicine

## 2022-12-26 DIAGNOSIS — R3129 Other microscopic hematuria: Secondary | ICD-10-CM | POA: Diagnosis not present

## 2022-12-26 DIAGNOSIS — S46012D Strain of muscle(s) and tendon(s) of the rotator cuff of left shoulder, subsequent encounter: Secondary | ICD-10-CM | POA: Diagnosis not present

## 2023-01-02 DIAGNOSIS — S46012D Strain of muscle(s) and tendon(s) of the rotator cuff of left shoulder, subsequent encounter: Secondary | ICD-10-CM | POA: Diagnosis not present

## 2023-01-03 ENCOUNTER — Other Ambulatory Visit: Payer: Self-pay | Admitting: Internal Medicine

## 2023-01-04 DIAGNOSIS — S46012D Strain of muscle(s) and tendon(s) of the rotator cuff of left shoulder, subsequent encounter: Secondary | ICD-10-CM | POA: Diagnosis not present

## 2023-01-07 DIAGNOSIS — S46012D Strain of muscle(s) and tendon(s) of the rotator cuff of left shoulder, subsequent encounter: Secondary | ICD-10-CM | POA: Diagnosis not present

## 2023-01-10 DIAGNOSIS — S46012D Strain of muscle(s) and tendon(s) of the rotator cuff of left shoulder, subsequent encounter: Secondary | ICD-10-CM | POA: Diagnosis not present

## 2023-01-15 ENCOUNTER — Other Ambulatory Visit: Payer: Self-pay | Admitting: Internal Medicine

## 2023-01-25 DIAGNOSIS — H2512 Age-related nuclear cataract, left eye: Secondary | ICD-10-CM | POA: Diagnosis not present

## 2023-01-25 DIAGNOSIS — H2511 Age-related nuclear cataract, right eye: Secondary | ICD-10-CM | POA: Diagnosis not present

## 2023-01-30 ENCOUNTER — Other Ambulatory Visit: Payer: Medicare HMO

## 2023-02-08 DIAGNOSIS — H2512 Age-related nuclear cataract, left eye: Secondary | ICD-10-CM | POA: Diagnosis not present

## 2023-02-21 ENCOUNTER — Encounter: Payer: Self-pay | Admitting: Podiatry

## 2023-03-07 DIAGNOSIS — H524 Presbyopia: Secondary | ICD-10-CM | POA: Diagnosis not present

## 2023-03-07 DIAGNOSIS — H52223 Regular astigmatism, bilateral: Secondary | ICD-10-CM | POA: Diagnosis not present

## 2023-03-13 ENCOUNTER — Ambulatory Visit (INDEPENDENT_AMBULATORY_CARE_PROVIDER_SITE_OTHER): Payer: BC Managed Care – PPO | Admitting: Podiatry

## 2023-03-13 DIAGNOSIS — M2141 Flat foot [pes planus] (acquired), right foot: Secondary | ICD-10-CM

## 2023-03-13 DIAGNOSIS — M2142 Flat foot [pes planus] (acquired), left foot: Secondary | ICD-10-CM

## 2023-03-13 DIAGNOSIS — N182 Chronic kidney disease, stage 2 (mild): Secondary | ICD-10-CM

## 2023-03-13 DIAGNOSIS — E1122 Type 2 diabetes mellitus with diabetic chronic kidney disease: Secondary | ICD-10-CM

## 2023-03-13 DIAGNOSIS — M722 Plantar fascial fibromatosis: Secondary | ICD-10-CM

## 2023-03-13 NOTE — Progress Notes (Signed)
Patient presents today to pick up diabetic custom molded foot orthotics recommended by Dr. Logan Bores. They were adjusted from Diabetic 3 to Diabetic 2 for better shoe fit.  Diabetic insert Medicare paperwork was given to her, she will take to her PCP.  Orthotics were dispensed and fit was satisfactory. Reviewed instructions for break-in and wear. Written instructions given to patient.  Patient will follow up as needed.

## 2023-03-18 ENCOUNTER — Encounter: Payer: Self-pay | Admitting: Internal Medicine

## 2023-03-18 ENCOUNTER — Ambulatory Visit (INDEPENDENT_AMBULATORY_CARE_PROVIDER_SITE_OTHER): Payer: Medicare HMO | Admitting: Obstetrics and Gynecology

## 2023-03-18 ENCOUNTER — Other Ambulatory Visit: Payer: Self-pay

## 2023-03-18 ENCOUNTER — Encounter: Payer: Self-pay | Admitting: Obstetrics and Gynecology

## 2023-03-18 ENCOUNTER — Other Ambulatory Visit (HOSPITAL_COMMUNITY)
Admission: RE | Admit: 2023-03-18 | Discharge: 2023-03-18 | Disposition: A | Discharge status: Home / Self Care | Payer: Medicare HMO | Source: Ambulatory Visit | Attending: Obstetrics and Gynecology | Admitting: Obstetrics and Gynecology

## 2023-03-18 VITALS — BP 138/51 | HR 78 | Wt 183.2 lb

## 2023-03-18 DIAGNOSIS — N888 Other specified noninflammatory disorders of cervix uteri: Secondary | ICD-10-CM | POA: Diagnosis not present

## 2023-03-18 DIAGNOSIS — N889 Noninflammatory disorder of cervix uteri, unspecified: Secondary | ICD-10-CM

## 2023-03-18 DIAGNOSIS — Z1211 Encounter for screening for malignant neoplasm of colon: Secondary | ICD-10-CM | POA: Insufficient documentation

## 2023-03-18 HISTORY — DX: Encounter for screening for malignant neoplasm of colon: Z12.11

## 2023-03-18 NOTE — Progress Notes (Signed)
NEW GYNECOLOGY PATIENT Patient name: Monica Ryan MRN [id number]  Date of birth: 03/24/1956 Chief Complaint:   new gyn     History:  Monica Ryan is a 67 y.o. No obstetric history on file. being seen today for suspected cervical polyp.  10 years ore more had last menses. No vaginal drynes or abnormal vaginal discharge. No prior abnormal pap smear that she is aware      Gynecologic History No LMP recorded. Patient is postmenopausal. Contraception: post menopausal status Last Pap:     Component Value Date/Time   DIAGPAP  11/27/2022 0912    - Negative for intraepithelial lesion or malignancy (NILM)   DIAGPAP  09/30/2018 0000    NEGATIVE FOR INTRAEPITHELIAL LESIONS OR MALIGNANCY.   HPVHIGH Negative 11/27/2022 0912   ADEQPAP  11/27/2022 0912    Satisfactory for evaluation; transformation zone component PRESENT.   ADEQPAP  09/30/2018 0000    Satisfactory for evaluation  endocervical/transformation zone component PRESENT.    High Risk HPV: Positive  Adequacy:  Satisfactory for evaluation, transformation zone component PRESENT  Diagnosis:  Atypical squamous cells of undetermined significance (ASC-US)   Last Mammogram: 04/2022 BIRADS 1 Last Colonoscopy: 02/2021  Obstetric History OB History  No obstetric history on file.    Past Medical History:  Diagnosis Date   Colon cancer screening 03/18/2023   Diabetes mellitus without complication (HCC)    Diarrhea 10/30/2021   Hyperlipemia    Hypertension     Past Surgical History:  Procedure Laterality Date   BREAST BIOPSY Right 2000   BREAST EXCISIONAL BIOPSY Bilateral 06/14/1999   benign   BREAST EXCISIONAL BIOPSY Left 1974   benign   COLONOSCOPY     SHOULDER ARTHROSCOPY WITH ROTATOR CUFF REPAIR AND SUBACROMIAL DECOMPRESSION  07/29/2012   Procedure: SHOULDER ARTHROSCOPY WITH ROTATOR CUFF REPAIR AND SUBACROMIAL DECOMPRESSION;  Surgeon: Monica Ryan., MD;  Location: [facility];  Service:  Orthopedics;  Laterality: Right;  Arthroscopy Subacromial Decompression, Distal Clavicle Resection, Arthroscopic Subscapularis Repair, Open Repair of Supraspinatus and Infraspinatus     TUBAL LIGATION     TUMOR EXCISION     right jaw    Current Outpatient Medications on File Prior to Visit  Medication Sig Dispense Refill   Alcohol Swabs (ALCOHOL PADS) 70 % PADS Use as instructed to check blood sugars 1 time per day dx: e11.65 150 each 2   amLODipine (NORVASC) 5 MG tablet TAKE 1 TABLET EVERY DAY 90 tablet 3   aspirin EC 81 MG tablet Take 81 mg by mouth daily.     atorvastatin (LIPITOR) 20 MG tablet TAKE 1 TABLET BY MOUTH EVERY DAY 90 tablet 3   Blood Glucose Monitoring Suppl (ONETOUCH VERIO REFLECT) w/Device KIT Check blood sugars once daily E11.69 1 kit 1   dapagliflozin propanediol (FARXIGA) 10 MG TABS tablet TAKE 1 TABLET BY MOUTH EVERY DAY IN THE MORNING 90 tablet 2   glucose blood (ONETOUCH VERIO) test strip Check blood sugars once daily E11.69 100 each 3   Lancets (ONETOUCH DELICA PLUS LANCET33G) MISC Check blood sugars once daily E11.69 100 each 3   lisinopril-hydrochlorothiazide (ZESTORETIC) 20-12.5 MG tablet TAKE 1 TABLET EVERY DAY 90 tablet 3   Semaglutide, 2 MG/DOSE, (OZEMPIC, 2 MG/DOSE,) 8 MG/3ML SOPN INJECT 2 MG INTO THE SKIN ONCE A WEEK. 9 mL 1   diclofenac (VOLTAREN) 75 MG EC tablet Take 1 tablet (75 mg total) by mouth 2 (two) times daily. (Patient not taking: Reported on 03/18/2023) 60  tablet 1   ketorolac (ACULAR) 0.5 % ophthalmic solution Place 1 drop into the left eye 4 (four) times daily. (Patient not taking: Reported on 03/18/2023)     moxifloxacin (VIGAMOX) 0.5 % ophthalmic solution Place 1 drop into the left eye 4 (four) times daily. (Patient not taking: Reported on 03/18/2023)     nystatin cream (MYCOSTATIN) Apply 1 Application topically 2 (two) times daily as needed for dry skin. (Patient not taking: Reported on 03/18/2023) 30 g 0   prednisoLONE acetate (PRED FORTE) 1 %  ophthalmic suspension Place 1 drop into the left eye 4 (four) times daily. (Patient not taking: Reported on 03/18/2023)     No current facility-administered medications on file prior to visit.    No Known Allergies  Social History:  reports that she has never smoked. She has never used smokeless tobacco. She reports that she does not currently use alcohol. She reports that she does not use drugs.  Family History  Problem Relation Age of Onset   Diabetes Mother    Alzheimer's disease Father    Diabetes Sister    Diabetes Brother     The following portions of the patient's history were reviewed and updated as appropriate: allergies, current medications, past family history, past medical history, past social history, past surgical history and problem list.  Review of Systems Pertinent items noted in HPI and remainder of comprehensive ROS otherwise negative.  Physical Exam:  BP (!) 138/51   Pulse 78   Wt 183 lb 3.2 oz (83.1 kg)   BMI 34.62 kg/m  Physical Exam Vitals and nursing note reviewed. Exam conducted with a chaperone present.  Constitutional:      Appearance: Normal appearance.  Cardiovascular:     Rate and Rhythm: Normal rate.  Pulmonary:     Effort: Pulmonary effort is normal.     Breath sounds: Normal breath sounds.  Genitourinary:    General: Normal vulva.     Vagina: Normal.     Comments: 8mm lesion - suspected calcified nabothian cyst with otherwise normal appearing cervix, mild atrophic changes of the vagina Neurological:     General: No focal deficit present.     Mental Status: She is alert and oriented to person, place, and time.  Psychiatric:        Mood and Affect: Mood normal.        Behavior: Behavior normal.        Thought Content: Thought content normal.        Judgment: Judgment normal.       Assessment and Plan:   1. Lesion of cervix Biopsy of lesion collected - suspect calcified nabothian cyst, normal pap reassuring.  - Surgical pathology(  [facility]/ [facility])  Routine preventative health maintenance measures emphasized. Please refer to After Visit Summary for other counseling recommendations.   Follow-up: Return if symptoms worsen or fail to improve.      Lorriane Shire, MD Obstetrician & Gynecologist, Faculty Practice [facility] for [employer], [facility]

## 2023-03-20 LAB — SURGICAL PATHOLOGY

## 2023-03-22 ENCOUNTER — Encounter: Payer: Self-pay | Admitting: Podiatry

## 2023-04-01 ENCOUNTER — Encounter: Payer: Self-pay | Admitting: Podiatry

## 2023-04-02 ENCOUNTER — Encounter: Payer: Self-pay | Admitting: Obstetrics and Gynecology

## 2023-04-08 ENCOUNTER — Encounter: Payer: Self-pay | Admitting: Internal Medicine

## 2023-04-08 ENCOUNTER — Other Ambulatory Visit: Payer: Self-pay | Admitting: Internal Medicine

## 2023-04-08 ENCOUNTER — Ambulatory Visit (INDEPENDENT_AMBULATORY_CARE_PROVIDER_SITE_OTHER): Payer: Medicare HMO | Admitting: Internal Medicine

## 2023-04-08 VITALS — BP 122/80 | HR 78 | Temp 97.9°F | Ht 61.0 in | Wt 183.8 lb

## 2023-04-08 DIAGNOSIS — Z23 Encounter for immunization: Secondary | ICD-10-CM | POA: Diagnosis not present

## 2023-04-08 DIAGNOSIS — Z1231 Encounter for screening mammogram for malignant neoplasm of breast: Secondary | ICD-10-CM

## 2023-04-08 DIAGNOSIS — E1122 Type 2 diabetes mellitus with diabetic chronic kidney disease: Secondary | ICD-10-CM

## 2023-04-08 DIAGNOSIS — N182 Chronic kidney disease, stage 2 (mild): Secondary | ICD-10-CM | POA: Diagnosis not present

## 2023-04-08 DIAGNOSIS — Z6834 Body mass index (BMI) 34.0-34.9, adult: Secondary | ICD-10-CM | POA: Diagnosis not present

## 2023-04-08 DIAGNOSIS — E6609 Other obesity due to excess calories: Secondary | ICD-10-CM

## 2023-04-08 DIAGNOSIS — I129 Hypertensive chronic kidney disease with stage 1 through stage 4 chronic kidney disease, or unspecified chronic kidney disease: Secondary | ICD-10-CM | POA: Diagnosis not present

## 2023-04-08 DIAGNOSIS — L989 Disorder of the skin and subcutaneous tissue, unspecified: Secondary | ICD-10-CM

## 2023-04-08 NOTE — Assessment & Plan Note (Signed)
She is encouraged to strive for BMI less than 30 to decrease cardiac risk. Advised to aim for at least 150 minutes of exercise per week. She will continue with her workouts at the [facility] and [employer].

## 2023-04-08 NOTE — Patient Instructions (Signed)
Hypertension, Adult Hypertension is another name for high blood pressure. High blood pressure forces your heart to work harder to pump blood. This can cause problems over time. There are two numbers in a blood pressure reading. There is a top number (systolic) over a bottom number (diastolic). It is best to have a blood pressure that is below 120/80. What are the causes? The cause of this condition is not known. Some other conditions can lead to high blood pressure. What increases the risk? Some lifestyle factors can make you more likely to develop high blood pressure: Smoking. Not getting enough exercise or physical activity. Being overweight. Having too much fat, sugar, calories, or salt (sodium) in your diet. Drinking too much alcohol. Other risk factors include: Having any of these conditions: Heart disease. Diabetes. High cholesterol. Kidney disease. Obstructive sleep apnea. Having a family history of high blood pressure and high cholesterol. Age. The risk increases with age. Stress. What are the signs or symptoms? High blood pressure may not cause symptoms. Very high blood pressure (hypertensive crisis) may cause: Headache. Fast or uneven heartbeats (palpitations). Shortness of breath. Nosebleed. Vomiting or feeling like you may vomit (nauseous). Changes in how you see. Very bad chest pain. Feeling dizzy. Seizures. How is this treated? This condition is treated by making healthy lifestyle changes, such as: Eating healthy foods. Exercising more. Drinking less alcohol. Your doctor may prescribe medicine if lifestyle changes do not help enough and if: Your top number is above 130. Your bottom number is above 80. Your personal target blood pressure may vary. Follow these instructions at home: Eating and drinking  If told, follow the DASH eating plan. To follow this plan: Fill one half of your plate at each meal with fruits and vegetables. Fill one fourth of your plate  at each meal with whole grains. Whole grains include whole-wheat pasta, brown rice, and whole-grain bread. Eat or drink low-fat dairy products, such as skim milk or low-fat yogurt. Fill one fourth of your plate at each meal with low-fat (lean) proteins. Low-fat proteins include fish, chicken without skin, eggs, beans, and tofu. Avoid fatty meat, cured and processed meat, or chicken with skin. Avoid pre-made or processed food. Limit the amount of salt in your diet to less than 1,500 mg each day. Do not drink alcohol if: Your doctor tells you not to drink. You are pregnant, may be pregnant, or are planning to become pregnant. If you drink alcohol: Limit how much you have to: 0-1 drink a day for women. 0-2 drinks a day for men. Know how much alcohol is in your drink. In the [place] one drink equals one 12 oz bottle of beer (355 mL), one 5 oz glass of wine (148 mL), or one 1 oz glass of hard liquor (44 mL). Lifestyle  Work with your doctor to stay at a healthy weight or to lose weight. Ask your doctor what the best weight is for you. Get at least 30 minutes of exercise that causes your heart to beat faster (aerobic exercise) most days of the week. This may include walking, swimming, or biking. Get at least 30 minutes of exercise that strengthens your muscles (resistance exercise) at least 3 days a week. This may include lifting weights or doing Pilates. Do not smoke or use any products that contain nicotine or tobacco. If you need help quitting, ask your doctor. Check your blood pressure at home as told by your doctor. Keep all follow-up visits. Medicines Take over-the-counter and prescription medicines   only as told by your doctor. Follow directions carefully. Do not skip doses of blood pressure medicine. The medicine does not work as well if you skip doses. Skipping doses also puts you at risk for problems. Ask your doctor about side effects or reactions to medicines that you should watch  for. Contact a doctor if: You think you are having a reaction to the medicine you are taking. You have headaches that keep coming back. You feel dizzy. You have swelling in your ankles. You have trouble with your vision. Get help right away if: You get a very bad headache. You start to feel mixed up (confused). You feel weak or numb. You feel faint. You have very bad pain in your: Chest. Belly (abdomen). You vomit more than once. You have trouble breathing. These symptoms may be an emergency. Get help right away. Call 911. Do not wait to see if the symptoms will go away. Do not drive yourself to the hospital. Summary Hypertension is another name for high blood pressure. High blood pressure forces your heart to work harder to pump blood. For most people, a normal blood pressure is less than 120/80. Making healthy choices can help lower blood pressure. If your blood pressure does not get lower with healthy choices, you may need to take medicine. This information is not intended to replace advice given to you by your health care provider. Make sure you discuss any questions you have with your health care provider. Document Revised: 06/29/2021 Document Reviewed: 06/29/2021 Elsevier Patient Education  2024 Elsevier Inc.  

## 2023-04-08 NOTE — Progress Notes (Signed)
I,[name], CMA,acting as a Neurosurgeon for [name], MD.,have documented all relevant documentation on the behalf of [name], MD,as directed by  [name], MD while in the presence of [name], MD.  Subjective:  Patient ID: Monica Ryan , female    DOB: 10-Mar-1956 , 67 y.o.   MRN: [id number]  Chief Complaint  Patient presents with   Hypertension   Diabetes    HPI  She is here today for diabetes/BP check. She reports compliance with meds.  She denies headaches, chest pain and shortness of breath. She states she has been exercising 3-4 days per week. She denies having any specific questions or concerns.   Diabetes She presents for her follow-up diabetic visit. She has type 2 diabetes mellitus. Her disease course has been stable. There are no hypoglycemic associated symptoms. Pertinent negatives for diabetes include no blurred vision, no polydipsia, no polyphagia and no polyuria. There are no hypoglycemic complications. Risk factors for coronary artery disease include diabetes mellitus, dyslipidemia, hypertension and post-menopausal. She is following a diabetic diet. She participates in exercise three times a week. Her breakfast blood glucose is taken between 7-8 am. Her breakfast blood glucose range is generally 90-110 mg/dl. An ACE inhibitor/angiotensin II receptor blocker is being taken. Eye exam is current.  Hypertension This is a chronic problem. The current episode started more than 1 year ago. The problem is controlled. Pertinent negatives include no blurred vision. Risk factors for coronary artery disease include diabetes mellitus, dyslipidemia and obesity. The current treatment provides moderate improvement.     Past Medical History:  Diagnosis Date   Colon cancer screening 03/18/2023   Diabetes mellitus without complication (HCC)    Diarrhea 10/30/2021   Hyperlipemia    Hypertension      Family History  Problem Relation Age of Onset    Diabetes Mother    Alzheimer's disease Father    Diabetes Sister    Diabetes Brother      Current Outpatient Medications:    Alcohol Swabs (ALCOHOL PADS) 70 % PADS, Use as instructed to check blood sugars 1 time per day dx: e11.65, Disp: 150 each, Rfl: 2   amLODipine (NORVASC) 5 MG tablet, TAKE 1 TABLET EVERY DAY, Disp: 90 tablet, Rfl: 3   aspirin EC 81 MG tablet, Take 81 mg by mouth daily., Disp: , Rfl:    atorvastatin (LIPITOR) 20 MG tablet, TAKE 1 TABLET BY MOUTH EVERY DAY, Disp: 90 tablet, Rfl: 3   Blood Glucose Monitoring Suppl (ONETOUCH VERIO REFLECT) w/Device KIT, Check blood sugars once daily E11.69, Disp: 1 kit, Rfl: 1   dapagliflozin propanediol (FARXIGA) 10 MG TABS tablet, TAKE 1 TABLET BY MOUTH EVERY DAY IN THE MORNING, Disp: 90 tablet, Rfl: 2   glucose blood (ONETOUCH VERIO) test strip, Check blood sugars once daily E11.69, Disp: 100 each, Rfl: 3   Lancets (ONETOUCH DELICA PLUS LANCET33G) MISC, Check blood sugars once daily E11.69, Disp: 100 each, Rfl: 3   lisinopril-hydrochlorothiazide (ZESTORETIC) 20-12.5 MG tablet, TAKE 1 TABLET EVERY DAY, Disp: 90 tablet, Rfl: 3   Semaglutide, 2 MG/DOSE, (OZEMPIC, 2 MG/DOSE,) 8 MG/3ML SOPN, INJECT 2 MG INTO THE SKIN ONCE A WEEK., Disp: 9 mL, Rfl: 1   diclofenac (VOLTAREN) 75 MG EC tablet, Take 1 tablet (75 mg total) by mouth 2 (two) times daily. (Patient not taking: Reported on 03/18/2023), Disp: 60 tablet, Rfl: 1   nystatin cream (MYCOSTATIN), Apply 1 Application topically 2 (two) times daily as needed  for dry skin. (Patient not taking: Reported on 03/18/2023), Disp: 30 g, Rfl: 0   No Known Allergies   Review of Systems  Constitutional: Negative.   Eyes:  Negative for blurred vision.  Respiratory: Negative.    Cardiovascular: Negative.   Gastrointestinal: Negative.   Endocrine: Negative for polydipsia, polyphagia and polyuria.  Musculoskeletal: Negative.   Skin: Negative.   Neurological: Negative.   Psychiatric/Behavioral: Negative.        Today's Vitals   04/08/23 0946  BP: 122/80  Pulse: 78  Temp: 97.9 F (36.6 C)  SpO2: 98%  Weight: 183 lb 12.8 oz (83.4 kg)  Height: 5\' 1"  (1.549 m)   Body mass index is 34.73 kg/m.  Wt Readings from Last 3 Encounters:  04/08/23 183 lb 12.8 oz (83.4 kg)  03/18/23 183 lb 3.2 oz (83.1 kg)  11/27/22 184 lb (83.5 kg)     Objective:  Physical Exam Vitals and nursing note reviewed.  Constitutional:      Appearance: Normal appearance. She is obese.  HENT:     Head: Normocephalic and atraumatic.  Eyes:     Extraocular Movements: Extraocular movements intact.  Cardiovascular:     Rate and Rhythm: Normal rate and regular rhythm.     Heart sounds: Normal heart sounds.  Pulmonary:     Effort: Pulmonary effort is normal.     Breath sounds: Normal breath sounds.  Musculoskeletal:     Cervical back: Normal range of motion.  Skin:    General: Skin is warm.     Comments: Hyperkeratotic, hyperpigmented, scaly lesion on posterior upr right arm  Neurological:     General: No focal deficit present.     Mental Status: She is alert.  Psychiatric:        Mood and Affect: Mood normal.        Behavior: Behavior normal.         Assessment And Plan:  Hypertensive nephropathy Assessment & Plan: Chronic, controlled. She will c/2 amlodipine 5mg  and lisinopril/hct 20/12.5mg  daily. She is encouraged to follow a low sodium diet.   Orders: -     CMP14+EGFR -     Hemoglobin A1c  Type 2 diabetes mellitus with stage 2 chronic kidney disease, without long-term current use of insulin (HCC) Assessment & Plan: Chronic, I will check labs as below. Importance of medication/dietary compliance was discussed with the patient. She will c/w [place] 10mg  daily an semaglutide 2mg  weekly. Sjhe will f/u in 3-4 months for re-evaluation.  Orders: -     CMP14+EGFR -     Hemoglobin A1c  Skin lesion of right arm Assessment & Plan: I will refer her to Derm for further evaluation/biopsy and removal.    Orders: -     Ambulatory referral to Dermatology  Class 1 obesity due to excess calories with serious comorbidity and body mass index (BMI) of 34.0 to 34.9 in adult Assessment & Plan: She is encouraged to strive for BMI less than 30 to decrease cardiac risk. Advised to aim for at least 150 minutes of exercise per week. She will continue with her workouts at the [facility] and [employer].    Need for Tdap vaccination -     Tdap vaccine greater than or equal to 7yo IM  She is encouraged to strive for BMI less than 30 to decrease cardiac risk. Advised to aim for at least 150 minutes of exercise per week.    Return in about 4 months (around 08/09/2023), or dm check.  Patient was given opportunity to ask questions. Patient verbalized understanding of the plan and was able to repeat key elements of the plan. All questions were answered to their satisfaction.   I, [name], MD, have reviewed all documentation for this visit. The documentation on 04/08/23 for the exam, diagnosis, procedures, and orders are all accurate and complete.   IF YOU HAVE BEEN REFERRED TO A SPECIALIST, IT MAY TAKE 1-2 WEEKS TO SCHEDULE/PROCESS THE REFERRAL. IF YOU HAVE NOT HEARD FROM US/SPECIALIST IN TWO WEEKS, PLEASE GIVE [place] A CALL AT [phone].   THE PATIENT IS ENCOURAGED TO PRACTICE SOCIAL DISTANCING DUE TO THE COVID-19 PANDEMIC.

## 2023-04-09 LAB — CMP14+EGFR
ALT: 27 IU/L (ref 0–32)
AST: 23 IU/L (ref 0–40)
Albumin: 4.4 g/dL (ref 3.9–4.9)
Alkaline Phosphatase: 149 IU/L — ABNORMAL HIGH (ref 44–121)
BUN/Creatinine Ratio: 15 (ref 12–28)
BUN: 16 mg/dL (ref 8–27)
Bilirubin Total: 0.4 mg/dL (ref 0.0–1.2)
CO2: 23 mmol/L (ref 20–29)
Calcium: 10 mg/dL (ref 8.7–10.3)
Chloride: 101 mmol/L (ref 96–106)
Creatinine, Ser: 1.07 mg/dL — ABNORMAL HIGH (ref 0.57–1.00)
Globulin, Total: 2.9 g/dL (ref 1.5–4.5)
Glucose: 100 mg/dL — ABNORMAL HIGH (ref 70–99)
Potassium: 4.3 mmol/L (ref 3.5–5.2)
Sodium: 141 mmol/L (ref 134–144)
Total Protein: 7.3 g/dL (ref 6.0–8.5)
eGFR: 57 mL/min/{1.73_m2} — ABNORMAL LOW (ref >59)

## 2023-04-09 LAB — HEMOGLOBIN A1C
Est. average glucose Bld gHb Est-mCnc: 151 mg/dL
Hgb A1c MFr Bld: 6.9 % — ABNORMAL HIGH (ref 4.8–5.6)

## 2023-04-10 DIAGNOSIS — L989 Disorder of the skin and subcutaneous tissue, unspecified: Secondary | ICD-10-CM | POA: Insufficient documentation

## 2023-04-14 NOTE — Assessment & Plan Note (Signed)
Chronic, controlled. She will c/2 amlodipine 5mg  and lisinopril/hct 20/12.5mg  daily. She is encouraged to follow a low sodium diet.

## 2023-04-14 NOTE — Assessment & Plan Note (Addendum)
I will refer her to Derm for further evaluation/biopsy and removal.

## 2023-04-14 NOTE — Assessment & Plan Note (Signed)
Chronic, I will check labs as below. Importance of medication/dietary compliance was discussed with the patient. She will c/w [place] 10mg  daily an semaglutide 2mg  weekly. Sjhe will f/u in 3-4 months for re-evaluation.

## 2023-04-15 IMAGING — MG MM DIGITAL SCREENING BILAT W/ TOMO AND CAD
8 series · 8 of 24 positions shown · non-contrast
Comparison: Previous exam(s).

CLINICAL DATA: Screening.

EXAM:
DIGITAL SCREENING BILATERAL MAMMOGRAM WITH TOMOSYNTHESIS AND CAD
TECHNIQUE: Bilateral screening digital craniocaudal and mediolateral oblique
mammograms were obtained. Bilateral screening digital breast
tomosynthesis was performed. The images were evaluated with
computer-aided detection.

[L CC synth-2D]
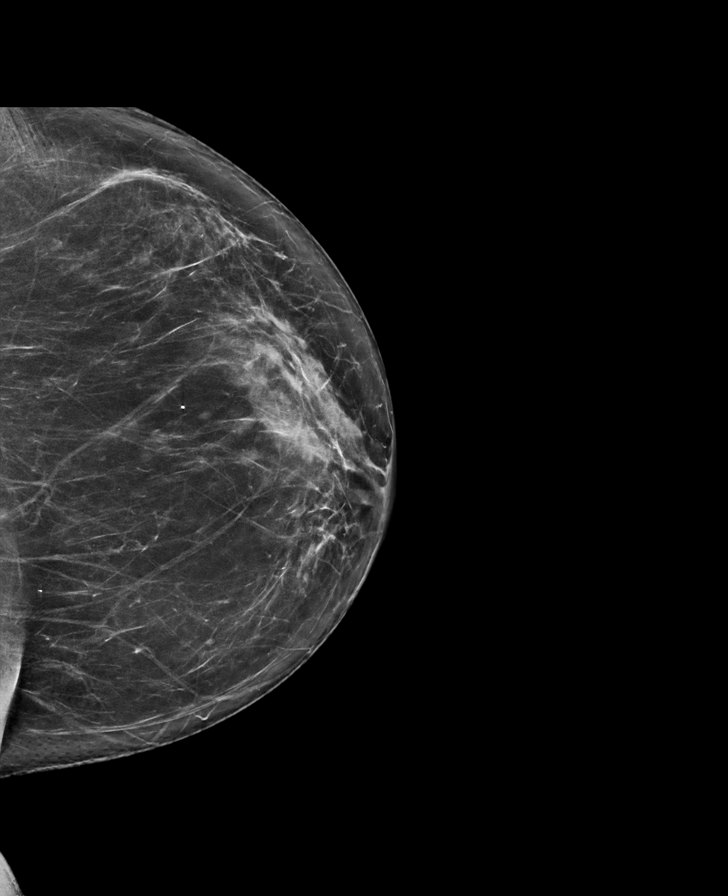

[L MLO synth-2D]
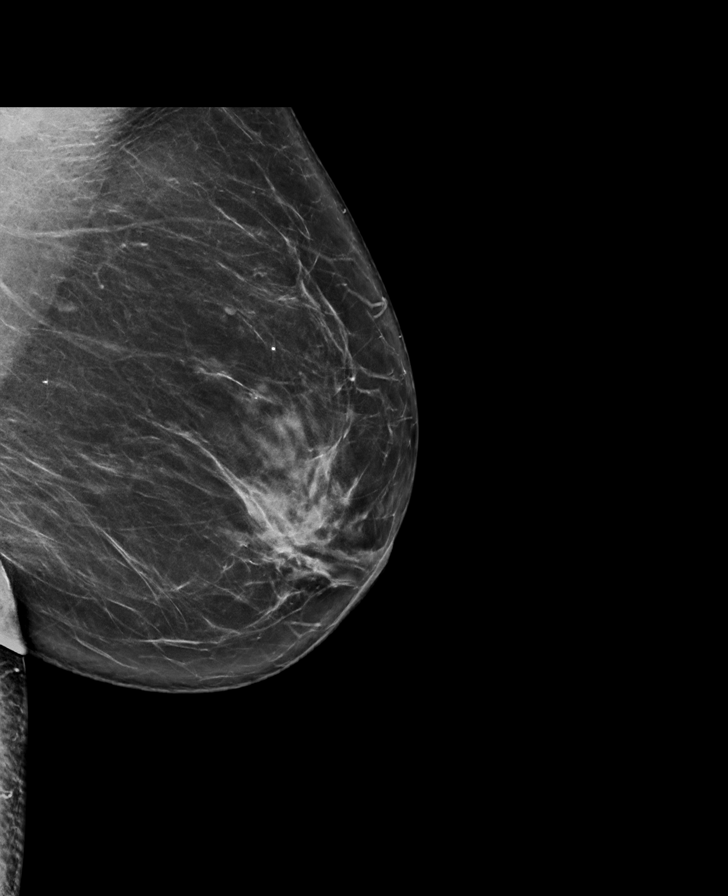

[R MLO synth-2D]
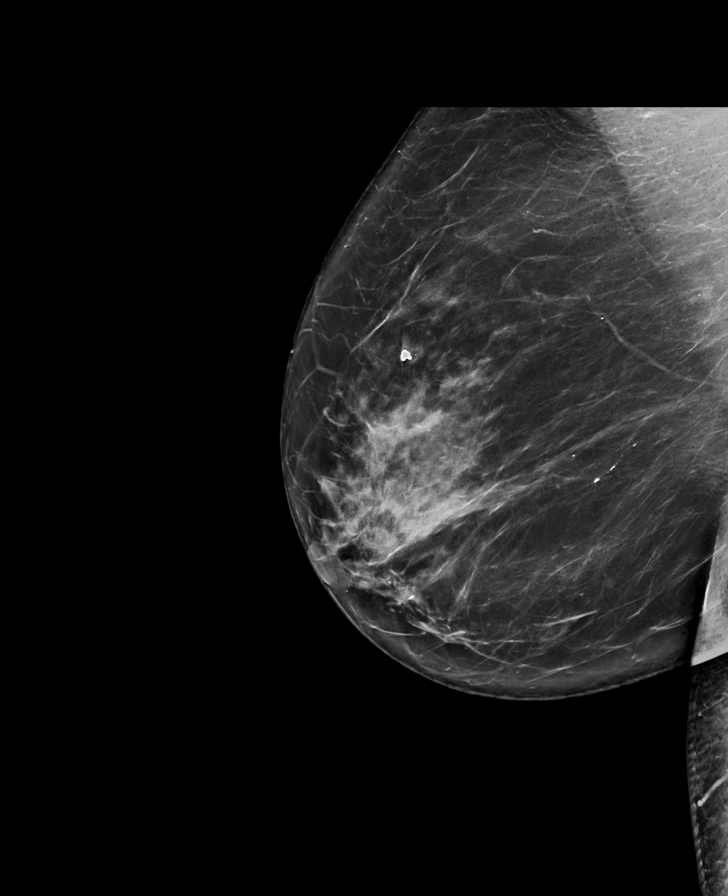

[R CC synth-2D]
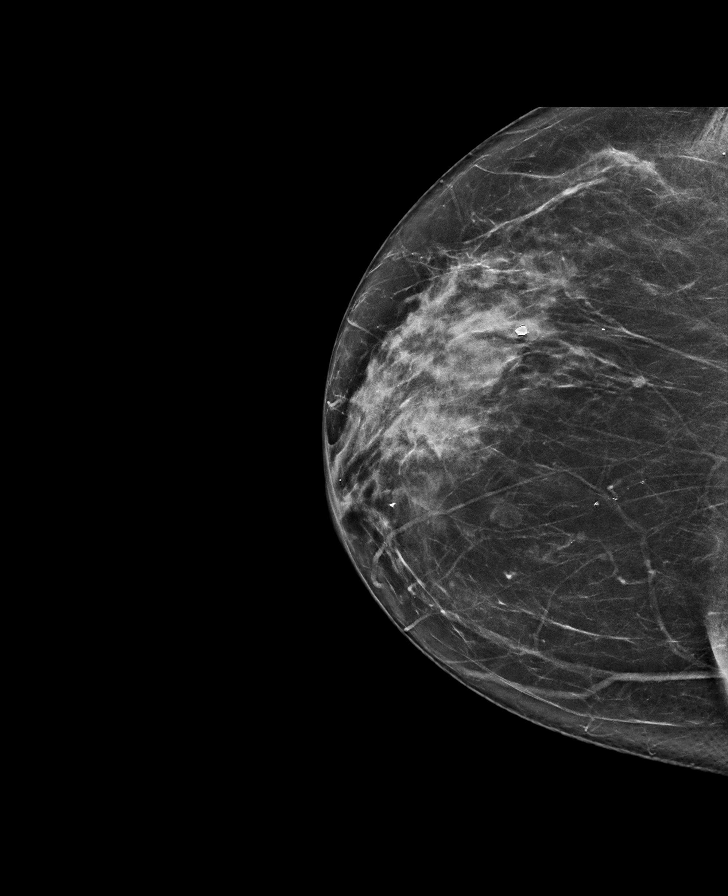

[L MLO tomo · tomo slice 43/86.0]
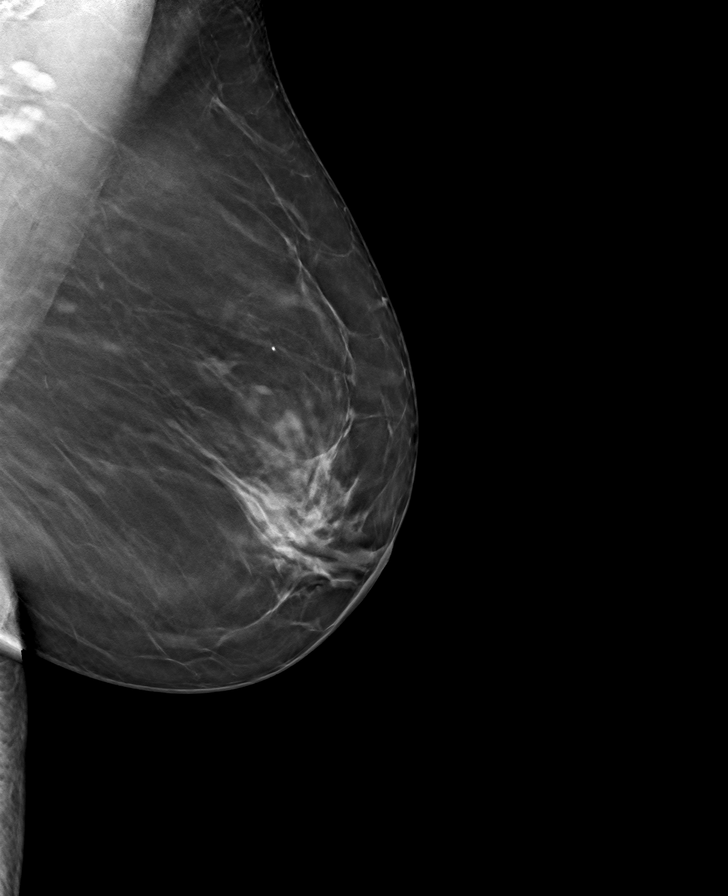

[L CC tomo · tomo slice 37/73.0]
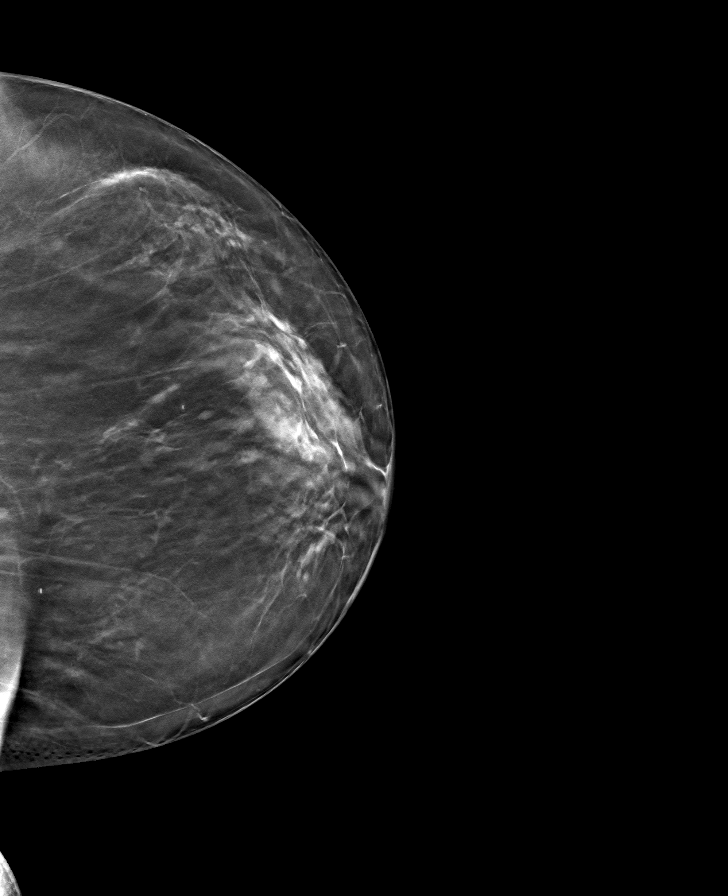

[R MLO tomo · tomo slice 45/88.0]
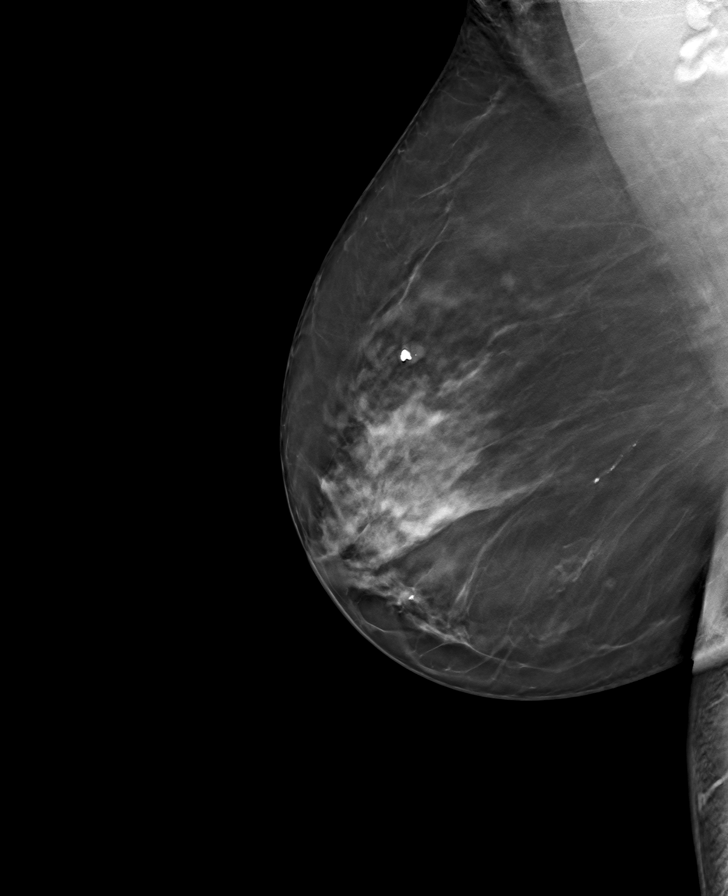

[R CC tomo · tomo slice 38/75.0]
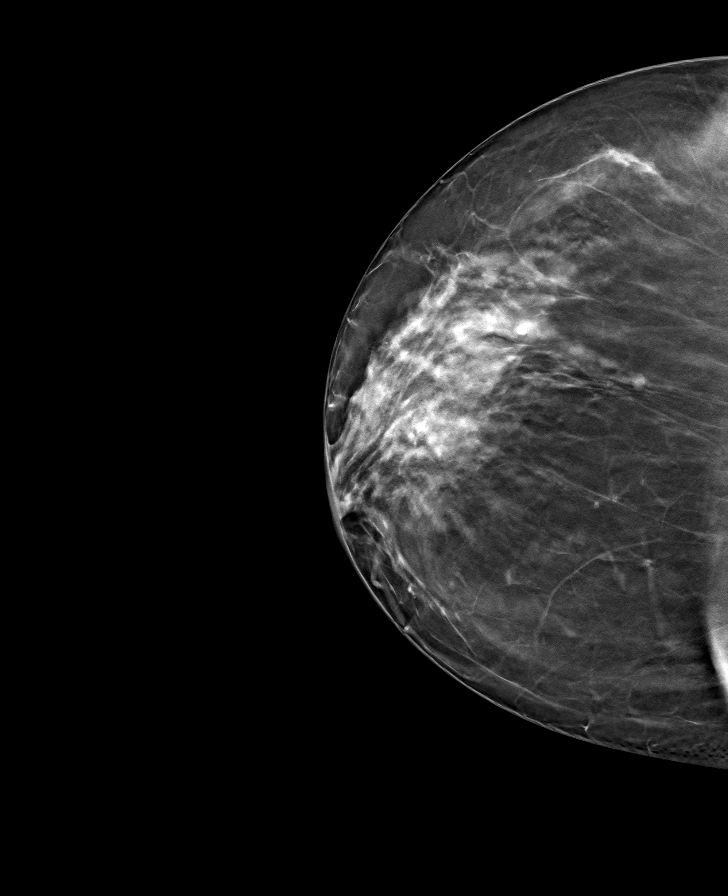

[8 of 24 positions shown; findings below may reference images not displayed]

ACR Breast Density Category c: The breast tissue is heterogeneously
dense, which may obscure small masses.
FINDINGS: There are no findings suspicious for malignancy.
IMPRESSION: No mammographic evidence of malignancy. A result letter of this
screening mammogram will be mailed directly to the patient.

RECOMMENDATION:
Screening mammogram in one year. (Code:Q3-W-BC3)

BI-RADS CATEGORY  1: Negative.

## 2023-04-24 ENCOUNTER — Encounter: Payer: Self-pay | Admitting: Internal Medicine

## 2023-04-25 ENCOUNTER — Ambulatory Visit: Payer: Medicare HMO

## 2023-04-25 ENCOUNTER — Other Ambulatory Visit: Payer: Self-pay

## 2023-04-25 NOTE — Progress Notes (Signed)
Patient was here for adjustment  Patient c/o feeling like orthotics are not giving enough support  I added 1/4" crepe to bottom of orthotics creating and bit of a lift and plantar fill on BIL orthotics to add rigidity to MLA's creating better support  Patient was pleased with results and will try out. Patient was instructed to call office if any other issues arise   Addison Bailey Cped, CFO, CFm

## 2023-04-26 ENCOUNTER — Other Ambulatory Visit: Payer: Self-pay

## 2023-04-26 MED ORDER — DAPAGLIFLOZIN PROPANEDIOL 10 MG PO TABS: ORAL_TABLET | ORAL | 2 refills | Status: DC

## 2023-05-24 ENCOUNTER — Ambulatory Visit
Admission: RE | Admit: 2023-05-24 | Discharge: 2023-05-24 | Disposition: A | Discharge status: Home / Self Care | Payer: BC Managed Care – PPO | Source: Ambulatory Visit | Attending: Internal Medicine | Admitting: Internal Medicine

## 2023-05-24 ENCOUNTER — Ambulatory Visit
Admission: RE | Admit: 2023-05-24 | Discharge: 2023-05-24 | Disposition: A | Discharge status: Home / Self Care | Payer: Medicare HMO | Source: Ambulatory Visit | Attending: Internal Medicine | Admitting: Internal Medicine

## 2023-05-24 DIAGNOSIS — E2839 Other primary ovarian failure: Secondary | ICD-10-CM

## 2023-05-24 DIAGNOSIS — Z1231 Encounter for screening mammogram for malignant neoplasm of breast: Secondary | ICD-10-CM

## 2023-05-24 DIAGNOSIS — N958 Other specified menopausal and perimenopausal disorders: Secondary | ICD-10-CM | POA: Diagnosis not present

## 2023-05-24 DIAGNOSIS — M8588 Other specified disorders of bone density and structure, other site: Secondary | ICD-10-CM | POA: Diagnosis not present

## 2023-05-24 DIAGNOSIS — E349 Endocrine disorder, unspecified: Secondary | ICD-10-CM | POA: Diagnosis not present

## 2023-05-30 ENCOUNTER — Encounter: Payer: Self-pay | Admitting: Internal Medicine

## 2023-05-31 ENCOUNTER — Other Ambulatory Visit: Payer: Self-pay

## 2023-05-31 MED ORDER — TRUE METRIX BLOOD GLUCOSE TEST VI STRP: ORAL_STRIP | 2 refills | Status: DC

## 2023-05-31 MED ORDER — TRUE METRIX AIR GLUCOSE METER W/DEVICE KIT: PACK | 0 refills | Status: DC

## 2023-05-31 MED ORDER — LANCETS MISC: 2 refills | Status: DC

## 2023-06-03 ENCOUNTER — Other Ambulatory Visit: Payer: Self-pay

## 2023-06-03 MED ORDER — LANCETS MISC: 2 refills | Status: DC

## 2023-06-03 MED ORDER — TRUE METRIX BLOOD GLUCOSE TEST VI STRP: ORAL_STRIP | 2 refills | Status: DC

## 2023-06-03 MED ORDER — TRUE METRIX AIR GLUCOSE METER W/DEVICE KIT: PACK | 0 refills | Status: AC

## 2023-06-07 ENCOUNTER — Other Ambulatory Visit: Payer: Self-pay

## 2023-06-07 MED ORDER — ONETOUCH DELICA PLUS LANCET33G MISC: 3 refills | Status: DC

## 2023-06-07 MED ORDER — ONETOUCH VERIO VI STRP: ORAL_STRIP | 3 refills | Status: DC

## 2023-06-07 MED ORDER — TRUE METRIX BLOOD GLUCOSE TEST VI STRP: ORAL_STRIP | 2 refills | Status: DC

## 2023-06-29 ENCOUNTER — Ambulatory Visit: Payer: BC Managed Care – PPO

## 2023-06-29 DIAGNOSIS — Z23 Encounter for immunization: Secondary | ICD-10-CM

## 2023-07-17 ENCOUNTER — Other Ambulatory Visit: Payer: Self-pay | Admitting: Internal Medicine

## 2023-07-17 ENCOUNTER — Other Ambulatory Visit: Payer: Self-pay

## 2023-07-17 MED ORDER — OZEMPIC (2 MG/DOSE) 8 MG/3ML ~~LOC~~ SOPN: 2.0000 mg | PEN_INJECTOR | SUBCUTANEOUS | 1 refills | Status: DC

## 2023-07-18 ENCOUNTER — Telehealth: Payer: Self-pay

## 2023-07-18 NOTE — Telephone Encounter (Signed)
PA for ozempic 2mg  has been submitted on covermymeds. We are waiting on the determination. Monica Ryan

## 2023-08-12 ENCOUNTER — Encounter: Payer: Self-pay | Admitting: Internal Medicine

## 2023-08-12 ENCOUNTER — Ambulatory Visit (INDEPENDENT_AMBULATORY_CARE_PROVIDER_SITE_OTHER): Payer: Medicare HMO | Admitting: Internal Medicine

## 2023-08-12 VITALS — BP 130/80 | HR 75 | Temp 97.8°F | Ht 61.0 in | Wt 182.6 lb

## 2023-08-12 DIAGNOSIS — E6609 Other obesity due to excess calories: Secondary | ICD-10-CM

## 2023-08-12 DIAGNOSIS — M545 Low back pain, unspecified: Secondary | ICD-10-CM | POA: Diagnosis not present

## 2023-08-12 DIAGNOSIS — E1122 Type 2 diabetes mellitus with diabetic chronic kidney disease: Secondary | ICD-10-CM | POA: Diagnosis not present

## 2023-08-12 DIAGNOSIS — N182 Chronic kidney disease, stage 2 (mild): Secondary | ICD-10-CM | POA: Diagnosis not present

## 2023-08-12 DIAGNOSIS — I129 Hypertensive chronic kidney disease with stage 1 through stage 4 chronic kidney disease, or unspecified chronic kidney disease: Secondary | ICD-10-CM

## 2023-08-12 DIAGNOSIS — E66811 Obesity, class 1: Secondary | ICD-10-CM | POA: Diagnosis not present

## 2023-08-12 DIAGNOSIS — Z6834 Body mass index (BMI) 34.0-34.9, adult: Secondary | ICD-10-CM | POA: Diagnosis not present

## 2023-08-12 MED ORDER — LISINOPRIL-HYDROCHLOROTHIAZIDE 20-12.5 MG PO TABS: 1.0000 | ORAL_TABLET | Freq: Every day | ORAL | 3 refills | Status: DC

## 2023-08-12 NOTE — Progress Notes (Signed)
I,Monica Ryan, CMA,acting as a Neurosurgeon for Monica Aliment, MD.,have documented all relevant documentation on the behalf of Monica Aliment, MD,as directed by  Monica Aliment, MD while in the presence of Monica Aliment, MD.  Subjective:  Patient ID: Monica Ryan , female    DOB: 1956-08-17 , 67 y.o.   MRN: [id number]  Chief Complaint  Patient presents with   Hypertension   Diabetes    HPI  She is here today for diabetes/BP check. She reports compliance with meds.  She denies headaches, chest pain and shortness of breath.    Diabetes She presents for her follow-up diabetic visit. She has type 2 diabetes mellitus. Her disease course has been stable. There are no hypoglycemic associated symptoms. Pertinent negatives for diabetes include no blurred vision, no polydipsia and no polyuria. There are no hypoglycemic complications. Risk factors for coronary artery disease include diabetes mellitus, dyslipidemia, hypertension and post-menopausal. She is following a diabetic diet. She participates in exercise three times a week. Her breakfast blood glucose is taken between 7-8 am. Her breakfast blood glucose range is generally 90-110 mg/dl. An ACE inhibitor/angiotensin II receptor blocker is being taken. Eye exam is current.  Hypertension This is a chronic problem. The current episode started more than 1 year ago. The problem is controlled. Pertinent negatives include no blurred vision. Risk factors for coronary artery disease include diabetes mellitus, dyslipidemia and obesity. The current treatment provides moderate improvement.     Past Medical History:  Diagnosis Date   Colon cancer screening 03/18/2023   Diabetes mellitus without complication (HCC)    Diarrhea 10/30/2021   Hyperlipemia    Hypertension      Family History  Problem Relation Age of Onset   Diabetes Mother    Alzheimer's disease Father    Diabetes Sister    Diabetes Brother      Current Outpatient  Medications:    Alcohol Swabs (ALCOHOL PADS) 70 % PADS, Use as instructed to check blood sugars 1 time per day dx: e11.65, Disp: 150 each, Rfl: 2   amLODipine (NORVASC) 5 MG tablet, TAKE 1 TABLET EVERY DAY, Disp: 90 tablet, Rfl: 3   aspirin EC 81 MG tablet, Take 81 mg by mouth daily., Disp: , Rfl:    atorvastatin (LIPITOR) 20 MG tablet, TAKE 1 TABLET EVERY DAY, Disp: 90 tablet, Rfl: 3   Blood Glucose Monitoring Suppl (ONETOUCH VERIO REFLECT) w/Device KIT, Check blood sugars once daily E11.69, Disp: 1 kit, Rfl: 1   Blood Glucose Monitoring Suppl (TRUE METRIX AIR GLUCOSE METER) w/Device KIT, Use as directed to check blood sugars once daily E11.69, Disp: 1 kit, Rfl: 0   dapagliflozin propanediol (FARXIGA) 10 MG TABS tablet, TAKE 1 TABLET BY MOUTH EVERY DAY IN THE MORNING, Disp: 90 tablet, Rfl: 2   glucose blood (ONETOUCH VERIO) test strip, Check blood sugars once daily E11.69, Disp: 100 each, Rfl: 3   glucose blood (TRUE METRIX BLOOD GLUCOSE TEST) test strip, USE AS INSTRUCTED TO CHECK BLOOD SUGAR 1 TIME PER DAY, Disp: 100 strip, Rfl: 2   Lancets (ONETOUCH DELICA PLUS LANCET33G) MISC, Check blood sugars once daily E11.69, Disp: 100 each, Rfl: 3   Semaglutide, 2 MG/DOSE, (OZEMPIC, 2 MG/DOSE,) 8 MG/3ML SOPN, Inject 2 mg into the skin once a week., Disp: 9 mL, Rfl: 1   Lancets MISC, Use as directed to check blood sugars once daily E11.69 (Patient not taking: Reported on 08/12/2023), Disp: 100 each, Rfl: 2   lisinopril-hydrochlorothiazide (ZESTORETIC)  20-12.5 MG tablet, Take 1 tablet by mouth daily., Disp: 90 tablet, Rfl: 3   No Known Allergies   Review of Systems  Constitutional: Negative.   Eyes:  Negative for blurred vision.  Respiratory: Negative.    Cardiovascular: Negative.   Gastrointestinal: Negative.   Endocrine: Negative for polydipsia and polyuria.  Musculoskeletal:  Positive for back pain.       She c/o LBP. Sx started about two weeks ago. No radiation. No urinary/bowel incontinence.  She thinks her sx started after moving plants into her home. She has yet to try any OTC meds.   Skin: Negative.   Neurological: Negative.   Psychiatric/Behavioral: Negative.       Today's Vitals   08/12/23 1027  BP: 130/80  Pulse: 75  Temp: 97.8 F (36.6 C)  Weight: 182 lb 9.6 oz (82.8 kg)  Height: 5\' 1"  (1.549 m)  PainSc: 5   PainLoc: Back   Body mass index is 34.5 kg/m.  Wt Readings from Last 3 Encounters:  08/12/23 182 lb 9.6 oz (82.8 kg)  04/08/23 183 lb 12.8 oz (83.4 kg)  03/18/23 183 lb 3.2 oz (83.1 kg)     Objective:  Physical Exam Vitals and nursing note reviewed.  Constitutional:      Appearance: Normal appearance.  HENT:     Head: Normocephalic and atraumatic.  Eyes:     Extraocular Movements: Extraocular movements intact.  Cardiovascular:     Rate and Rhythm: Normal rate and regular rhythm.     Heart sounds: Normal heart sounds.  Pulmonary:     Effort: Pulmonary effort is normal.     Breath sounds: Normal breath sounds.  Musculoskeletal:        General: Tenderness present.     Cervical back: Normal range of motion.  Skin:    General: Skin is warm.  Neurological:     General: No focal deficit present.     Mental Status: She is alert.  Psychiatric:        Mood and Affect: Mood normal.        Behavior: Behavior normal.         Assessment And Plan:  Type 2 diabetes mellitus with stage 2 chronic kidney disease, without long-term current use of insulin (HCC) Assessment & Plan: Chronic, I will check labs as below. Importance of medication/dietary compliance was discussed with the patient. She will c/w Farxiga 10mg  daily and semaglutide 2mg  weekly. She will f/u in 3-4 months for re-evaluation.  Orders: -     CMP14+EGFR -     Hemoglobin A1c -     TSH  Hypertensive nephropathy Assessment & Plan: Chronic, controlled. Goal BP<120/80.  She will c/w amlodipine 5mg  and lisinopril/hct 20/12.5mg  daily. She is encouraged to follow a low sodium diet. She is  reminded breads/cheese can have high sodium levels. She will f/u in March 2025 for her physical exam.   Orders: -     CMP14+EGFR  Acute bilateral low back pain without sciatica Assessment & Plan: Her sx are likely due to strain from lifting heavy plants. Advised to apply topical pain cream to affected area twice daily as needed.  She was also given stretching exercises to perform daily.  She will let me know if her symptoms persist.    Class 1 obesity due to excess calories with serious comorbidity and body mass index (BMI) of 34.0 to 34.9 in adult Assessment & Plan: She is encouraged to strive for BMI less than 30 to decrease cardiac  risk. Advised to aim for at least 150 minutes of exercise per week. She will continue with her workouts at the [facility] and [employer].    Other orders -     Lisinopril-hydroCHLOROthiazide; Take 1 tablet by mouth daily.  Dispense: 90 tablet; Refill: 3     Return if symptoms worsen or fail to improve.  Patient was given opportunity to ask questions. Patient verbalized understanding of the plan and was able to repeat key elements of the plan. All questions were answered to their satisfaction.    I, Monica Aliment, MD, have reviewed all documentation for this visit. The documentation on 08/12/23 for the exam, diagnosis, procedures, and orders are all accurate and complete.   IF YOU HAVE BEEN REFERRED TO A SPECIALIST, IT MAY TAKE 1-2 WEEKS TO SCHEDULE/PROCESS THE REFERRAL. IF YOU HAVE NOT HEARD FROM US/SPECIALIST IN TWO WEEKS, PLEASE GIVE [place] A CALL AT [phone].   THE PATIENT IS ENCOURAGED TO PRACTICE SOCIAL DISTANCING DUE TO THE COVID-19 PANDEMIC.

## 2023-08-12 NOTE — Assessment & Plan Note (Addendum)
Her sx are likely due to strain from lifting heavy plants. Advised to apply topical pain cream to affected area twice daily as needed.  She was also given stretching exercises to perform daily.  She will let me know if her symptoms persist.

## 2023-08-12 NOTE — Patient Instructions (Signed)

## 2023-08-12 NOTE — Assessment & Plan Note (Signed)
She is encouraged to strive for BMI less than 30 to decrease cardiac risk. Advised to aim for at least 150 minutes of exercise per week. She will continue with her workouts at the [facility] and [employer].

## 2023-08-12 NOTE — Assessment & Plan Note (Signed)
Chronic, I will check labs as below. Importance of medication/dietary compliance was discussed with the patient. She will c/w Farxiga 10mg  daily and semaglutide 2mg  weekly. She will f/u in 3-4 months for re-evaluation.

## 2023-08-12 NOTE — Assessment & Plan Note (Addendum)
Chronic, controlled. Goal BP<120/80.  She will c/w amlodipine 5mg  and lisinopril/hct 20/12.5mg  daily. She is encouraged to follow a low sodium diet. She is reminded breads/cheese can have high sodium levels. She will f/u in March 2025 for her physical exam.

## 2023-08-13 LAB — CMP14+EGFR
ALT: 35 [IU]/L — ABNORMAL HIGH (ref 0–32)
AST: 28 [IU]/L (ref 0–40)
Albumin: 4.4 g/dL (ref 3.9–4.9)
Alkaline Phosphatase: 151 [IU]/L — ABNORMAL HIGH (ref 44–121)
BUN/Creatinine Ratio: 18 (ref 12–28)
BUN: 19 mg/dL (ref 8–27)
Bilirubin Total: 0.5 mg/dL (ref 0.0–1.2)
CO2: 23 mmol/L (ref 20–29)
Calcium: 9.8 mg/dL (ref 8.7–10.3)
Chloride: 101 mmol/L (ref 96–106)
Creatinine, Ser: 1.08 mg/dL — ABNORMAL HIGH (ref 0.57–1.00)
Globulin, Total: 2.9 g/dL (ref 1.5–4.5)
Glucose: 92 mg/dL (ref 70–99)
Potassium: 4.3 mmol/L (ref 3.5–5.2)
Sodium: 139 mmol/L (ref 134–144)
Total Protein: 7.3 g/dL (ref 6.0–8.5)
eGFR: 56 mL/min/{1.73_m2} — ABNORMAL LOW (ref >59)

## 2023-08-13 LAB — TSH: TSH: 1.54 u[IU]/mL (ref 0.450–4.500)

## 2023-08-13 LAB — HEMOGLOBIN A1C
Est. average glucose Bld gHb Est-mCnc: 148 mg/dL
Hgb A1c MFr Bld: 6.8 % — ABNORMAL HIGH (ref 4.8–5.6)

## 2023-08-26 DIAGNOSIS — M5137 Other intervertebral disc degeneration, lumbosacral region with discogenic back pain only: Secondary | ICD-10-CM | POA: Diagnosis not present

## 2023-08-26 DIAGNOSIS — M9902 Segmental and somatic dysfunction of thoracic region: Secondary | ICD-10-CM | POA: Diagnosis not present

## 2023-08-26 DIAGNOSIS — M5136 Other intervertebral disc degeneration, lumbar region with discogenic back pain only: Secondary | ICD-10-CM | POA: Diagnosis not present

## 2023-08-26 DIAGNOSIS — S29012A Strain of muscle and tendon of back wall of thorax, initial encounter: Secondary | ICD-10-CM | POA: Diagnosis not present

## 2023-08-26 DIAGNOSIS — M9905 Segmental and somatic dysfunction of pelvic region: Secondary | ICD-10-CM | POA: Diagnosis not present

## 2023-08-26 DIAGNOSIS — M9903 Segmental and somatic dysfunction of lumbar region: Secondary | ICD-10-CM | POA: Diagnosis not present

## 2023-08-28 DIAGNOSIS — M9902 Segmental and somatic dysfunction of thoracic region: Secondary | ICD-10-CM | POA: Diagnosis not present

## 2023-08-28 DIAGNOSIS — M5137 Other intervertebral disc degeneration, lumbosacral region with discogenic back pain only: Secondary | ICD-10-CM | POA: Diagnosis not present

## 2023-08-28 DIAGNOSIS — M5136 Other intervertebral disc degeneration, lumbar region with discogenic back pain only: Secondary | ICD-10-CM | POA: Diagnosis not present

## 2023-08-28 DIAGNOSIS — M9903 Segmental and somatic dysfunction of lumbar region: Secondary | ICD-10-CM | POA: Diagnosis not present

## 2023-08-28 DIAGNOSIS — M9905 Segmental and somatic dysfunction of pelvic region: Secondary | ICD-10-CM | POA: Diagnosis not present

## 2023-08-28 DIAGNOSIS — S29012A Strain of muscle and tendon of back wall of thorax, initial encounter: Secondary | ICD-10-CM | POA: Diagnosis not present

## 2023-09-02 DIAGNOSIS — M9902 Segmental and somatic dysfunction of thoracic region: Secondary | ICD-10-CM | POA: Diagnosis not present

## 2023-09-02 DIAGNOSIS — M9903 Segmental and somatic dysfunction of lumbar region: Secondary | ICD-10-CM | POA: Diagnosis not present

## 2023-09-02 DIAGNOSIS — M5136 Other intervertebral disc degeneration, lumbar region with discogenic back pain only: Secondary | ICD-10-CM | POA: Diagnosis not present

## 2023-09-02 DIAGNOSIS — M9905 Segmental and somatic dysfunction of pelvic region: Secondary | ICD-10-CM | POA: Diagnosis not present

## 2023-09-02 DIAGNOSIS — M5137 Other intervertebral disc degeneration, lumbosacral region with discogenic back pain only: Secondary | ICD-10-CM | POA: Diagnosis not present

## 2023-09-02 DIAGNOSIS — S29012A Strain of muscle and tendon of back wall of thorax, initial encounter: Secondary | ICD-10-CM | POA: Diagnosis not present

## 2023-09-04 DIAGNOSIS — S29012A Strain of muscle and tendon of back wall of thorax, initial encounter: Secondary | ICD-10-CM | POA: Diagnosis not present

## 2023-09-04 DIAGNOSIS — M9905 Segmental and somatic dysfunction of pelvic region: Secondary | ICD-10-CM | POA: Diagnosis not present

## 2023-09-04 DIAGNOSIS — M5136 Other intervertebral disc degeneration, lumbar region with discogenic back pain only: Secondary | ICD-10-CM | POA: Diagnosis not present

## 2023-09-04 DIAGNOSIS — M9902 Segmental and somatic dysfunction of thoracic region: Secondary | ICD-10-CM | POA: Diagnosis not present

## 2023-09-04 DIAGNOSIS — M5137 Other intervertebral disc degeneration, lumbosacral region with discogenic back pain only: Secondary | ICD-10-CM | POA: Diagnosis not present

## 2023-09-04 DIAGNOSIS — M9903 Segmental and somatic dysfunction of lumbar region: Secondary | ICD-10-CM | POA: Diagnosis not present

## 2023-09-16 DIAGNOSIS — S29012A Strain of muscle and tendon of back wall of thorax, initial encounter: Secondary | ICD-10-CM | POA: Diagnosis not present

## 2023-09-16 DIAGNOSIS — M5136 Other intervertebral disc degeneration, lumbar region with discogenic back pain only: Secondary | ICD-10-CM | POA: Diagnosis not present

## 2023-09-16 DIAGNOSIS — M9905 Segmental and somatic dysfunction of pelvic region: Secondary | ICD-10-CM | POA: Diagnosis not present

## 2023-09-16 DIAGNOSIS — M9902 Segmental and somatic dysfunction of thoracic region: Secondary | ICD-10-CM | POA: Diagnosis not present

## 2023-09-16 DIAGNOSIS — M5137 Other intervertebral disc degeneration, lumbosacral region with discogenic back pain only: Secondary | ICD-10-CM | POA: Diagnosis not present

## 2023-09-16 DIAGNOSIS — M9903 Segmental and somatic dysfunction of lumbar region: Secondary | ICD-10-CM | POA: Diagnosis not present

## 2023-09-23 DIAGNOSIS — M5136 Other intervertebral disc degeneration, lumbar region with discogenic back pain only: Secondary | ICD-10-CM | POA: Diagnosis not present

## 2023-09-23 DIAGNOSIS — M9902 Segmental and somatic dysfunction of thoracic region: Secondary | ICD-10-CM | POA: Diagnosis not present

## 2023-09-23 DIAGNOSIS — M9905 Segmental and somatic dysfunction of pelvic region: Secondary | ICD-10-CM | POA: Diagnosis not present

## 2023-09-23 DIAGNOSIS — S29012A Strain of muscle and tendon of back wall of thorax, initial encounter: Secondary | ICD-10-CM | POA: Diagnosis not present

## 2023-09-23 DIAGNOSIS — M5137 Other intervertebral disc degeneration, lumbosacral region with discogenic back pain only: Secondary | ICD-10-CM | POA: Diagnosis not present

## 2023-09-23 DIAGNOSIS — M9903 Segmental and somatic dysfunction of lumbar region: Secondary | ICD-10-CM | POA: Diagnosis not present

## 2023-09-27 ENCOUNTER — Telehealth: Payer: Self-pay

## 2023-09-27 ENCOUNTER — Encounter: Payer: Self-pay | Admitting: Internal Medicine

## 2023-09-27 NOTE — Telephone Encounter (Signed)
 PAP: PAP application for Farxiga , ([employer] ([employer] has been mailed to pt's home address on file. Will fax provider portion of application to provider's office when pt's portion is received.   Please note I have faxed the provider portion of the application to Dr. Catheryn Slocumb office for review.

## 2023-10-01 ENCOUNTER — Other Ambulatory Visit: Payer: Self-pay

## 2023-10-01 MED ORDER — OZEMPIC (2 MG/DOSE) 8 MG/3ML ~~LOC~~ SOPN: 2.0000 mg | PEN_INJECTOR | SUBCUTANEOUS | 2 refills | Status: DC

## 2023-10-11 NOTE — Telephone Encounter (Signed)
PAP: Patient assistance application Monica Ryan for has been approved by PAP Companies: [employer] from 1/1/20205 to 09/23/2024. Medication should be delivered to PAP Delivery: Home For further shipping updates, please contact [employer] at [phone] Pt ID is: [id number]

## 2023-10-18 ENCOUNTER — Other Ambulatory Visit: Payer: Self-pay | Admitting: Internal Medicine

## 2023-10-28 ENCOUNTER — Encounter: Payer: Self-pay | Admitting: Internal Medicine

## 2023-10-29 ENCOUNTER — Other Ambulatory Visit: Payer: Self-pay

## 2023-10-29 MED ORDER — AMLODIPINE BESYLATE 5 MG PO TABS: 5.0000 mg | ORAL_TABLET | Freq: Every day | ORAL | 3 refills | Status: DC

## 2023-10-29 MED ORDER — ONETOUCH VERIO VI STRP: ORAL_STRIP | 3 refills | Status: DC

## 2023-10-29 MED ORDER — LISINOPRIL-HYDROCHLOROTHIAZIDE 20-12.5 MG PO TABS: 1.0000 | ORAL_TABLET | Freq: Every day | ORAL | 3 refills | Status: DC

## 2023-10-29 MED ORDER — ONETOUCH VERIO REFLECT W/DEVICE KIT: PACK | 1 refills | Status: AC

## 2023-10-29 MED ORDER — ATORVASTATIN CALCIUM 20 MG PO TABS: 20.0000 mg | ORAL_TABLET | Freq: Every day | ORAL | 3 refills | Status: DC

## 2023-10-29 MED ORDER — ONETOUCH DELICA PLUS LANCET33G MISC: 3 refills | Status: DC

## 2023-11-15 ENCOUNTER — Ambulatory Visit: Payer: Medicare (Managed Care)

## 2023-11-15 DIAGNOSIS — Z0000 Encounter for general adult medical examination without abnormal findings: Secondary | ICD-10-CM

## 2023-11-15 NOTE — Patient Instructions (Signed)

## 2023-11-15 NOTE — Progress Notes (Signed)
 Subjective:   Monica Ryan is a 68 y.o. female who presents for Medicare Annual (Subsequent) preventive examination.  Visit Complete: Virtual I connected with  Monica Ryan on 11/18/23 by a audio enabled telemedicine application and verified that I am speaking with the correct person using two identifiers.  Patient Location: Home  Provider Location: Office/Clinic  I discussed the limitations of evaluation and management by telemedicine. The patient expressed understanding and agreed to proceed.  Vital Signs: Because this visit was a virtual/telehealth visit, some criteria may be missing or patient reported. Any vitals not documented were not able to be obtained and vitals that have been documented are patient reported.  Patient Medicare AWV questionnaire was completed by the patient on 11/15/2023; I have confirmed that all information a/nswered by patient is correct and no changes since this /date.  Cardiac Risk Factors include: diabetes mellitus;hypertension     Objective:    Today's Vitals   There is no height or weight on file to calculate BMI.     10/18/2022    8:16 AM 10/11/2021    8:32 AM 10/04/2020   11:35 AM 07/24/2012    9:33 AM  Advanced Directives  Does Patient Have a Medical Advance Directive? Yes Yes No Patient does not have advance directive  Type of Advance Directive Healthcare Power of [place];Living will Healthcare Power of [place];Living will    Does patient want to make changes to medical advance directive?   Yes (MAU/Ambulatory/Procedural Areas - Information given)   Copy of Healthcare Power of Attorney in Chart? No - copy requested No - copy requested    Would patient like information on creating a medical advance directive?   Yes (MAU/Ambulatory/Procedural Areas - Information given)     Current Medications (verified) Outpatient Encounter Medications as of 11/15/2023  Medication Sig   Alcohol Swabs (ALCOHOL PADS) 70 % PADS Use as instructed to  check blood sugars 1 time per day dx: e11.65   amLODipine (NORVASC) 5 MG tablet Take 1 tablet (5 mg total) by mouth daily.   aspirin EC 81 MG tablet Take 81 mg by mouth daily.   atorvastatin (LIPITOR) 20 MG tablet Take 1 tablet (20 mg total) by mouth daily.   Blood Glucose Monitoring Suppl (ONETOUCH VERIO REFLECT) w/Device KIT Check blood sugars once daily E11.69   Blood Glucose Monitoring Suppl (TRUE METRIX AIR GLUCOSE METER) w/Device KIT Use as directed to check blood sugars once daily E11.69   dapagliflozin propanediol (FARXIGA) 10 MG TABS tablet TAKE 1 TABLET BY MOUTH EVERY DAY IN THE MORNING   glucose blood (ONETOUCH VERIO) test strip Check blood sugars once daily E11.69   Lancets (ONETOUCH DELICA PLUS LANCET33G) MISC Check blood sugars once daily E11.69   lisinopril-hydrochlorothiazide (ZESTORETIC) 20-12.5 MG tablet Take 1 tablet by mouth daily.   Semaglutide, 2 MG/DOSE, (OZEMPIC, 2 MG/DOSE,) 8 MG/3ML SOPN Inject 2 mg into the skin once a week.   Lancets MISC Use as directed to check blood sugars once daily E11.69 (Patient not taking: Reported on 11/15/2023)   [DISCONTINUED] glucose blood (TRUE METRIX BLOOD GLUCOSE TEST) test strip USE AS INSTRUCTED TO CHECK BLOOD SUGAR 1 TIME PER DAY (Patient not taking: Reported on 11/15/2023)   No facility-administered encounter medications on file as of 11/15/2023.    Allergies (verified) Patient has no known allergies.   History: Past Medical History:  Diagnosis Date   Colon cancer screening 03/18/2023   Diabetes mellitus without complication (HCC)    Diarrhea 10/30/2021  Hyperlipemia    Hypertension    Past Surgical History:  Procedure Laterality Date   BREAST BIOPSY Right 2000   BREAST EXCISIONAL BIOPSY Bilateral 06/14/1999   benign   BREAST EXCISIONAL BIOPSY Left 1974   benign   COLONOSCOPY     SHOULDER ARTHROSCOPY WITH ROTATOR CUFF REPAIR AND SUBACROMIAL DECOMPRESSION  07/29/2012   Procedure: SHOULDER ARTHROSCOPY WITH ROTATOR CUFF  REPAIR AND SUBACROMIAL DECOMPRESSION;  Surgeon: Wyn Forster., MD;  Location: [facility];  Service: Orthopedics;  Laterality: Right;  Arthroscopy Subacromial Decompression, Distal Clavicle Resection, Arthroscopic Subscapularis Repair, Open Repair of Supraspinatus and Infraspinatus     TUBAL LIGATION     TUMOR EXCISION     right jaw   Family History  Problem Relation Age of Onset   Diabetes Mother    Alzheimer's disease Father    Diabetes Sister    Diabetes Brother    Social History   Socioeconomic History   Marital status: Single    Spouse name: Not on file   Number of children: Not on file   Years of education: Not on file   Highest education level: Not on file  Occupational History   Not on file  Tobacco Use   Smoking status: Never   Smokeless tobacco: Never   Tobacco comments:    n/a  Vaping Use   Vaping status: Never Used  Substance and Sexual Activity   Alcohol use: Not Currently    Comment: occ   Drug use: Never   Sexual activity: Not Currently  Other Topics Concern   Not on file  Social History Narrative   Not on file   Social Drivers of Health   Financial Resource Strain: Low Risk  (11/15/2023)   Overall Financial Resource Strain (CARDIA)    Difficulty of Paying Living Expenses: Not hard at all  Food Insecurity: No Food Insecurity (11/15/2023)   Hunger Vital Sign    Worried About Running Out of Food in the Last Year: Never true    Ran Out of Food in the Last Year: Never true  Transportation Needs: No Transportation Needs (10/18/2022)   PRAPARE - Administrator, Civil Service (Medical): No    Lack of Transportation (Non-Medical): No  Physical Activity: Sufficiently Active (11/15/2023)   Exercise Vital Sign    Days of Exercise per Week: 4 days    Minutes of Exercise per Session: 50 min  Stress: No Stress Concern Present (11/15/2023)   [employer] of Occupational Health - Occupational Stress Questionnaire    Feeling of  Stress : Not at all  Social Connections: Moderately Integrated (11/15/2023)   Social Connection and Isolation Panel [NHANES]    Frequency of Communication with Friends and Family: More than three times a week    Frequency of Social Gatherings with Friends and Family: Once a week    Attends Religious Services: More than 4 times per year    Active Member of [employer] or Organizations: Yes    Attends Engineer, structural: More than 4 times per year    Marital Status: Divorced    Tobacco Counseling Counseling given: Not Answered Tobacco comments: n/a   Clinical Intake:  Pre-visit preparation completed: Yes  Pain : No/denies pain     Nutritional Risks: None Diabetes: Yes CBG done?: No Did pt. bring in CBG monitor from home?: No  How often do you need to have someone help you when you read instructions, pamphlets, or other written materials from  your doctor or pharmacy?: 1 - Never What is the last grade level you completed in school?: some college  Interpreter Needed?: No  Information entered by :: [place] [facility]   Activities of Daily Living    11/15/2023    8:59 AM 11/15/2023    8:58 AM  In your present state of health, do you have any difficulty performing the following activities:  Hearing? 0 0  Vision? 0 0  Difficulty concentrating or making decisions? 0 0  Walking or climbing stairs? 0 0  Dressing or bathing? 0 0  Doing errands, shopping? 0 0  Preparing Food and eating ? N N  Using the Toilet? N N  In the past six months, have you accidently leaked urine? N N  Do you have problems with loss of bowel control? N N  Managing your Medications? N N  Managing your Finances? N N  Housekeeping or managing your Housekeeping? N N    Patient Care Team: Dorothyann Peng, MD as PCP - General (Internal Medicine) [facility], [place] Felecia Shelling, [place] as Consulting Physician (Podiatry)  Indicate any recent Medical Services you may have received from other than  [facility] providers in the past year (date may be approximate).     Assessment:   This is a routine wellness examination for Long.  Hearing/Vision screen No results found.   Goals Addressed               This Visit's Progress     Continue with staying in shape (pt-stated)        Exercising, continuing to go to the local gym.       Depression Screen    11/15/2023    9:04 AM 08/12/2023   10:29 AM 04/08/2023    9:45 AM 03/18/2023    1:46 PM 11/27/2022    8:24 AM 10/18/2022    8:16 AM 10/11/2021    8:33 AM  PHQ 2/9 Scores  PHQ - 2 Score 0 0 0 0 0 0 0  PHQ- 9 Score  0 0 0       Fall Risk    11/15/2023    9:05 AM 11/15/2023    8:58 AM 04/08/2023    9:45 AM 11/27/2022    8:24 AM 10/18/2022    8:16 AM  Fall Risk   Falls in the past year? 0 0 0 0 0  Number falls in past yr: 0 0 0 0 0  Injury with Fall? 0 0 0 0 0  Risk for fall due to : No Fall Risks  No Fall Risks No Fall Risks Medication side effect  Follow up Falls evaluation completed  Falls evaluation completed Falls evaluation completed Falls prevention discussed;Education provided;Falls evaluation completed    MEDICARE RISK AT HOME: Medicare Risk at Home Any stairs in or around the home?: (Patient-Rptd) Yes If so, are there any without handrails?: (Patient-Rptd) No Home free of loose throw rugs in walkways, pet beds, electrical cords, etc?: (Patient-Rptd) Yes Adequate lighting in your home to reduce risk of falls?: (Patient-Rptd) Yes Life alert?: (Patient-Rptd) No Use of a cane, walker or w/c?: (Patient-Rptd) No Grab bars in the bathroom?: (Patient-Rptd) No Shower chair or bench in shower?: (Patient-Rptd) No Elevated toilet seat or a handicapped toilet?: (Patient-Rptd) No     Cognitive Function:    10/04/2020   11:19 AM  MMSE - Mini Mental State Exam  Orientation to time 5  Orientation to Place 5  Registration 3  Attention/  Calculation 5  Recall 3  Language- name 2 objects 2  Language- repeat 1  Language-  follow 3 step command 3  Language- read & follow direction 1  Write a sentence 1  Copy design 1  Total score 30        11/15/2023    9:05 AM 10/18/2022    8:17 AM 10/11/2021    8:34 AM 10/04/2020   11:18 AM 10/04/2020   11:17 AM  6CIT Screen  What Year? 0 points 0 points 0 points 0 points 0 points  What month? 0 points 0 points 0 points 0 points 0 points  What time? 0 points 0 points 0 points 0 points 0 points  Count back from 20 0 points 0 points 0 points 0 points   Months in reverse 0 points 0 points 0 points 0 points   Repeat phrase 4 points 0 points 2 points 4 points   Total Score 4 points 0 points 2 points 4 points     Immunizations Immunization History  Administered Date(s) Administered   DTaP 02/06/2013   Fluad Quad(high Dose 65+) 10/04/2020, 06/14/2022   Influenza, High Dose Seasonal PF 06/07/2021   Influenza, Seasonal, Injecte, Preservative Fre 06/29/2023   Influenza,inj,Quad PF,6+ Mos 07/15/2019   Influenza-Unspecified 06/17/2018   Moderna SARS-COV2 Booster Vaccination 07/26/2020   Moderna Sars-Covid-2 Vaccination 10/17/2019, 11/21/2019, 03/13/2021   PNEUMOCOCCAL CONJUGATE-20 06/07/2021   Pfizer Covid-19 Vaccine Bivalent Booster 75yrs & up 07/10/2021   Pfizer(Comirnaty)Fall Seasonal Vaccine 12 years and older 07/31/2023   Pneumococcal Conjugate-13 01/16/2020   Pneumococcal-Unspecified 04/20/2014   Rsv, Bivalent, Protein Subunit Rsvpref,pf Verdis Frederickson) 07/31/2023   Td 07/10/2006   Tdap 04/08/2023   Zoster Recombinant(Shingrix) 04/23/2021, 06/30/2021    TDAP status: Up to date  Flu Vaccine status: Up to date  Pneumococcal vaccine status: Up to date  Covid-19 vaccine status: Completed vaccines  Qualifies for Shingles Vaccine? Yes   Zostavax completed Yes   Shingrix Completed?: Yes  Screening Tests Health Maintenance  Topic Date Due   OPHTHALMOLOGY EXAM  10/06/2023   Diabetic kidney evaluation - Urine ACR  11/27/2023   FOOT EXAM  11/27/2023   HEMOGLOBIN  A1C  02/09/2024   Diabetic kidney evaluation - eGFR measurement  08/11/2024   Medicare Annual Wellness (AWV)  11/14/2024   MAMMOGRAM  05/23/2025   Colonoscopy  03/03/2031   DTaP/Tdap/Td (4 - Td or Tdap) 04/07/2033   Pneumonia Vaccine 18+ Years old  Completed   INFLUENZA VACCINE  Completed   DEXA SCAN  Completed   COVID-19 Vaccine  Completed   Hepatitis C Screening  Completed   Zoster Vaccines- Shingrix  Completed   HPV VACCINES  Aged Out    Health Maintenance  Health Maintenance Due  Topic Date Due   OPHTHALMOLOGY EXAM  10/06/2023   Diabetic kidney evaluation - Urine ACR  11/27/2023    Colorectal cancer screening: Type of screening: Colonoscopy. Completed 03/02/2021. Repeat every 10 years  Mammogram status: Completed 05/24/2023. Repeat every year  Bone Density status: Completed 05/24/2023. Results reflect: Bone density results: NORMAL. Repeat every 2 years.  Additional Screening:  Hepatitis C Screening: does qualify; Completed 02/06/2013  Dental Screening: Recommended annual dental exams for proper oral hygiene  Community Resource Referral / Chronic Care Management: CRR required this visit?  No   CCM required this visit?  No     Plan:     I have personally reviewed and noted the following in the patient's chart:   Medical and social history Use  of alcohol, tobacco or illicit drugs  Current medications and supplements including opioid prescriptions. Patient is not currently taking opioid prescriptions. Functional ability and status Nutritional status Physical activity Advanced directives List of other physicians Hospitalizations, surgeries, and ER visits in previous 12 months Vitals Screenings to include cognitive, depression, and falls Referrals and appointments  In addition, I have reviewed and discussed with patient certain preventive protocols, quality metrics, and best practice recommendations. A written personalized care plan for preventive services as  well as general preventive health recommendations were provided to patient.     [place] Breeze, [place]   11/18/2023   After Visit Summary: (MyChart) Due to this being a telephonic visit, the after visit summary with patients personalized plan was offered to patient via MyChart   Nurse Notes: COMPLETED.

## 2023-12-09 ENCOUNTER — Ambulatory Visit: Payer: Medicare (Managed Care) | Admitting: Internal Medicine

## 2023-12-09 ENCOUNTER — Encounter: Payer: Self-pay | Admitting: Internal Medicine

## 2023-12-09 VITALS — BP 126/70 | HR 83 | Temp 98.1°F | Ht 61.0 in | Wt 182.8 lb

## 2023-12-09 DIAGNOSIS — E2839 Other primary ovarian failure: Secondary | ICD-10-CM

## 2023-12-09 DIAGNOSIS — Z6834 Body mass index (BMI) 34.0-34.9, adult: Secondary | ICD-10-CM

## 2023-12-09 DIAGNOSIS — Z860109 Personal history of other colon polyps: Secondary | ICD-10-CM

## 2023-12-09 DIAGNOSIS — Z0000 Encounter for general adult medical examination without abnormal findings: Secondary | ICD-10-CM

## 2023-12-09 DIAGNOSIS — E66811 Obesity, class 1: Secondary | ICD-10-CM | POA: Diagnosis not present

## 2023-12-09 DIAGNOSIS — N182 Chronic kidney disease, stage 2 (mild): Secondary | ICD-10-CM | POA: Diagnosis not present

## 2023-12-09 DIAGNOSIS — I129 Hypertensive chronic kidney disease with stage 1 through stage 4 chronic kidney disease, or unspecified chronic kidney disease: Secondary | ICD-10-CM | POA: Diagnosis not present

## 2023-12-09 DIAGNOSIS — E6609 Other obesity due to excess calories: Secondary | ICD-10-CM

## 2023-12-09 DIAGNOSIS — K921 Melena: Secondary | ICD-10-CM

## 2023-12-09 DIAGNOSIS — E1122 Type 2 diabetes mellitus with diabetic chronic kidney disease: Secondary | ICD-10-CM | POA: Diagnosis not present

## 2023-12-09 DIAGNOSIS — J301 Allergic rhinitis due to pollen: Secondary | ICD-10-CM

## 2023-12-09 LAB — POCT URINALYSIS DIPSTICK
Bilirubin, UA: NEGATIVE
Blood, UA: NEGATIVE
Glucose, UA: POSITIVE — AB
Ketones, UA: NEGATIVE
Leukocytes, UA: NEGATIVE
Nitrite, UA: NEGATIVE
Protein, UA: NEGATIVE
Spec Grav, UA: 1.025 (ref 1.010–1.025)
Urobilinogen, UA: 0.2 U/dL
pH, UA: 5.5 (ref 5.0–8.0)

## 2023-12-09 LAB — POC HEMOCCULT BLD/STL (OFFICE/1-CARD/DIAGNOSTIC): Fecal Occult Blood, POC: POSITIVE — AB

## 2023-12-09 NOTE — Assessment & Plan Note (Signed)
 Chronic, diabetic foot exam was performed.  I will check labs as below. Importance of medication/dietary compliance was discussed with the patient. She will c/w [place] 10mg  daily and semaglutide 2mg  weekly. She will f/u in 3-4 months for re-evaluation. I DISCUSSED WITH THE PATIENT AT LENGTH REGARDING THE GOALS OF GLYCEMIC CONTROL AND POSSIBLE LONG-TERM COMPLICATIONS.  I  ALSO STRESSED THE IMPORTANCE OF COMPLIANCE WITH HOME GLUCOSE MONITORING, DIETARY RESTRICTIONS INCLUDING AVOIDANCE OF SUGARY DRINKS/PROCESSED FOODS,  ALONG WITH REGULAR EXERCISE.  I  ALSO STRESSED THE IMPORTANCE OF ANNUAL EYE EXAMS, SELF FOOT CARE AND COMPLIANCE WITH OFFICE VISITS.

## 2023-12-09 NOTE — Assessment & Plan Note (Signed)

## 2023-12-09 NOTE — Patient Instructions (Addendum)
 Align, probiotic - once daily  Health Maintenance, Female Adopting a healthy lifestyle and getting preventive care are important in promoting health and wellness. Ask your health care provider about: The right schedule for you to have regular tests and exams. Things you can do on your own to prevent diseases and keep yourself healthy. What should I know about diet, weight, and exercise? Eat a healthy diet  Eat a diet that includes plenty of vegetables, fruits, low-fat dairy products, and lean protein. Do not eat a lot of foods that are high in solid fats, added sugars, or sodium. Maintain a healthy weight Body mass index (BMI) is used to identify weight problems. It estimates body fat based on height and weight. Your health care provider can help determine your BMI and help you achieve or maintain a healthy weight. Get regular exercise Get regular exercise. This is one of the most important things you can do for your health. Most adults should: Exercise for at least 150 minutes each week. The exercise should increase your heart rate and make you sweat (moderate-intensity exercise). Do strengthening exercises at least twice a week. This is in addition to the moderate-intensity exercise. Spend less time sitting. Even light physical activity can be beneficial. Watch cholesterol and blood lipids Have your blood tested for lipids and cholesterol at 69 years of age, then have this test every 5 years. Have your cholesterol levels checked more often if: Your lipid or cholesterol levels are high. You are older than 69 years of age. You are at high risk for heart disease. What should I know about cancer screening? Depending on your health history and family history, you may need to have cancer screening at various ages. This may include screening for: Breast cancer. Cervical cancer. Colorectal cancer. Skin cancer. Lung cancer. What should I know about heart disease, diabetes, and high blood  pressure? Blood pressure and heart disease High blood pressure causes heart disease and increases the risk of stroke. This is more likely to develop in people who have high blood pressure readings or are overweight. Have your blood pressure checked: Every 3-5 years if you are 69-45 years of age. Every year if you are 4 years old or older. Diabetes Have regular diabetes screenings. This checks your fasting blood sugar level. Have the screening done: Once every three years after age 46 if you are at a normal weight and have a low risk for diabetes. More often and at a younger age if you are overweight or have a high risk for diabetes. What should I know about preventing infection? Hepatitis B If you have a higher risk for hepatitis B, you should be screened for this virus. Talk with your health care provider to find out if you are at risk for hepatitis B infection. Hepatitis C Testing is recommended for: Everyone born from [birth year] through 1966. Anyone with known risk factors for hepatitis C. Sexually transmitted infections (STIs) Get screened for STIs, including gonorrhea and chlamydia, if: You are sexually active and are younger than 69 years of age. You are older than 69 years of age and your health care provider tells you that you are at risk for this type of infection. Your sexual activity has changed since you were last screened, and you are at increased risk for chlamydia or gonorrhea. Ask your health care provider if you are at risk. Ask your health care provider about whether you are at high risk for HIV. Your health care provider may recommend a  prescription medicine to help prevent HIV infection. If you choose to take medicine to prevent HIV, you should first get tested for HIV. You should then be tested every 3 months for as long as you are taking the medicine. Pregnancy If you are about to stop having your period (premenopausal) and you may become pregnant, seek counseling before you  get pregnant. Take 400 to 800 micrograms (mcg) of folic acid every day if you become pregnant. Ask for birth control (contraception) if you want to prevent pregnancy. Osteoporosis and menopause Osteoporosis is a disease in which the bones lose minerals and strength with aging. This can result in bone fractures. If you are 68 years old or older, or if you are at risk for osteoporosis and fractures, ask your health care provider if you should: Be screened for bone loss. Take a calcium or vitamin D supplement to lower your risk of fractures. Be given hormone replacement therapy (HRT) to treat symptoms of menopause. Follow these instructions at home: Alcohol use Do not drink alcohol if: Your health care provider tells you not to drink. You are pregnant, may be pregnant, or are planning to become pregnant. If you drink alcohol: Limit how much you have to: 0-1 drink a day. Know how much alcohol is in your drink. In the [place] one drink equals one 12 oz bottle of beer (355 mL), one 5 oz glass of wine (148 mL), or one 1 oz glass of hard liquor (44 mL). Lifestyle Do not use any products that contain nicotine or tobacco. These products include cigarettes, chewing tobacco, and vaping devices, such as e-cigarettes. If you need help quitting, ask your health care provider. Do not use street drugs. Do not share needles. Ask your health care provider for help if you need support or information about quitting drugs. General instructions Schedule regular health, dental, and eye exams. Stay current with your vaccines. Tell your health care provider if: You often feel depressed. You have ever been abused or do not feel safe at home. Summary Adopting a healthy lifestyle and getting preventive care are important in promoting health and wellness. Follow your health care provider's instructions about healthy diet, exercising, and getting tested or screened for diseases. Follow your health care provider's  instructions on monitoring your cholesterol and blood pressure. This information is not intended to replace advice given to you by your health care provider. Make sure you discuss any questions you have with your health care provider. Document Revised: 01/30/2021 Document Reviewed: 01/30/2021 Elsevier Patient Education  2024 [employer].

## 2023-12-09 NOTE — Assessment & Plan Note (Signed)
 She was given samples of Zyrtec to take nightly. Advised that it can make her drowsy.

## 2023-12-09 NOTE — Assessment & Plan Note (Addendum)
 DRE performed, hemoccult POS.  She does have external hemorrhoids; however, she also has h/o colon polyps. Also with change in bowel habits, she has to use bathroom shortly after eating. She agrees to GI referral for further evaluation. She is advised she will likely have colonoscopy performed. She does not have any family with h/o colon cancer.

## 2023-12-09 NOTE — Assessment & Plan Note (Addendum)
 She is encouraged to strive for BMI less than 30 to decrease cardiac risk. Advised to aim for at least 150 minutes of exercise per week. She will continue with her workouts at the [facility] and [employer]. She is encouraged to incorporate strength training into her daily routine.

## 2023-12-09 NOTE — Assessment & Plan Note (Signed)
 Chronic, controlled. Goal BP<120/80.  EKG performed, NSR w/o acute changes. She will c/w amlodipine 5mg  and lisinopril/hct 20/12.5mg  daily. She is encouraged to follow a low sodium diet. She is reminded breads/cheese can have high sodium levels. She will f/u in March 2025 for her physical exam.

## 2023-12-09 NOTE — Progress Notes (Signed)
 I,Monica Ryan, CMA,acting as a Neurosurgeon for [name], MD.,have documented all relevant documentation on the behalf of [name], MD,as directed by  [name], MD while in the presence of [name], MD.  Subjective:    Patient ID: Monica Ryan , female    DOB: [birth year] , 68 y.o.   MRN: [id number]  Chief Complaint  Patient presents with   Annual Exam   Diabetes   Hypertension    HPI  Patient presents for her physical. She does not have a GYN provider, he has retired. She reports compliance with medications. She denies having any headaches, chest pain and shortness of breath.  She reports having DM eye exam scheduled in May 2025.  She also completes foot exam with podiatrist, scheduled for the end of the month.     Diabetes She presents for her follow-up diabetic visit. She has type 2 diabetes mellitus. Her disease course has been stable. There are no hypoglycemic associated symptoms. Pertinent negatives for diabetes include no blurred vision. There are no hypoglycemic complications. Risk factors for coronary artery disease include diabetes mellitus, dyslipidemia, hypertension and post-menopausal. She is following a diabetic diet. She participates in exercise three times a week. Her breakfast blood glucose is taken between 7-8 am. Her breakfast blood glucose range is generally 90-110 mg/dl. An ACE inhibitor/angiotensin II receptor blocker is being taken. Eye exam is current.  Hypertension This is a chronic problem. The current episode started more than 1 year ago. The problem is controlled. Pertinent negatives include no blurred vision. Risk factors for coronary artery disease include diabetes mellitus, dyslipidemia and obesity. The current treatment provides moderate improvement.     Past Medical History:  Diagnosis Date   Colon cancer screening 03/18/2023   Diabetes mellitus without complication (HCC)    Diarrhea 10/30/2021   Hyperlipemia     Hypertension      Family History  Problem Relation Age of Onset   Diabetes Mother    Alzheimer's disease Father    Diabetes Sister    Diabetes Brother      Current Outpatient Medications:    Alcohol Swabs (ALCOHOL PADS) 70 % PADS, Use as instructed to check blood sugars 1 time per day dx: e11.65, Disp: 150 each, Rfl: 2   amLODipine (NORVASC) 5 MG tablet, Take 1 tablet (5 mg total) by mouth daily., Disp: 90 tablet, Rfl: 3   aspirin EC 81 MG tablet, Take 81 mg by mouth daily., Disp: , Rfl:    atorvastatin (LIPITOR) 20 MG tablet, Take 1 tablet (20 mg total) by mouth daily., Disp: 90 tablet, Rfl: 3   Blood Glucose Monitoring Suppl (ONETOUCH VERIO REFLECT) w/Device KIT, Check blood sugars once daily E11.69, Disp: 1 kit, Rfl: 1   Blood Glucose Monitoring Suppl (TRUE METRIX AIR GLUCOSE METER) w/Device KIT, Use as directed to check blood sugars once daily E11.69, Disp: 1 kit, Rfl: 0   dapagliflozin propanediol (FARXIGA) 10 MG TABS tablet, TAKE 1 TABLET BY MOUTH EVERY DAY IN THE MORNING, Disp: 90 tablet, Rfl: 2   glucose blood (ONETOUCH VERIO) test strip, Check blood sugars once daily E11.69, Disp: 100 each, Rfl: 3   Lancets (ONETOUCH DELICA PLUS LANCET33G) MISC, Check blood sugars once daily E11.69, Disp: 100 each, Rfl: 3   lisinopril-hydrochlorothiazide (ZESTORETIC) 20-12.5 MG tablet, Take 1 tablet by mouth daily., Disp: 90 tablet, Rfl: 3   Semaglutide, 2 MG/DOSE, (OZEMPIC, 2 MG/DOSE,) 8 MG/3ML SOPN, Inject 2 mg into the skin once  a week., Disp: 9 mL, Rfl: 2   Lancets MISC, Use as directed to check blood sugars once daily E11.69 (Patient not taking: Reported on 08/12/2023), Disp: 100 each, Rfl: 2   No Known Allergies    The patient states she uses post menopausal status for birth control. No LMP recorded. Patient is postmenopausal.. Negative for Dysmenorrhea. Negative for: breast discharge, breast lump(s), breast pain and breast self exam. Associated symptoms include abnormal vaginal bleeding.  Pertinent negatives include abnormal bleeding (hematology), anxiety, decreased libido, depression, difficulty falling sleep, dyspareunia, history of infertility, nocturia, sexual dysfunction, sleep disturbances, urinary incontinence, urinary urgency, vaginal discharge and vaginal itching. Diet regular.The patient states her exercise level is  moderate, mostly cardio.   . The patient's tobacco use is:  Social History   Tobacco Use  Smoking Status Never  Smokeless Tobacco Never  Tobacco Comments   n/a  . She has been exposed to passive smoke. The patient's alcohol use is:  Social History   Substance and Sexual Activity  Alcohol Use Not Currently   Comment: occ    Review of Systems  Constitutional: Negative.   HENT: Negative.    Eyes: Negative.  Negative for blurred vision.  Respiratory: Negative.    Cardiovascular: Negative.   Gastrointestinal: Negative.        She states after finishes eating, she has to instantly go to the bathroom. This happens frequently. Usually occurs within 5-10 minutes of eating. She has noticed blood on toilet paper after wiping.   Endocrine: Negative.   Genitourinary: Negative.   Musculoskeletal: Negative.   Skin: Negative.   Allergic/Immunologic: Negative.   Neurological: Negative.   Hematological: Negative.   Psychiatric/Behavioral: Negative.       Today's Vitals   12/09/23 0936  BP: 126/70  Pulse: 83  Temp: 98.1 F (36.7 C)  SpO2: 98%  Weight: 182 lb 12.8 oz (82.9 kg)  Height: 5\' 1"  (1.549 m)   Body mass index is 34.54 kg/m.  Wt Readings from Last 3 Encounters:  12/09/23 182 lb 12.8 oz (82.9 kg)  08/12/23 182 lb 9.6 oz (82.8 kg)  04/08/23 183 lb 12.8 oz (83.4 kg)     Objective:  Physical Exam Vitals and nursing note reviewed. Exam conducted with a chaperone present.  Constitutional:      Appearance: Normal appearance. She is obese.  HENT:     Head: Normocephalic and atraumatic.     Right Ear: Tympanic membrane, ear canal and  external ear normal.     Left Ear: Tympanic membrane, ear canal and external ear normal.     Nose:     Comments: Masked     Mouth/Throat:     Comments: Masked  Eyes:     Extraocular Movements: Extraocular movements intact.     Conjunctiva/sclera: Conjunctivae normal.     Pupils: Pupils are equal, round, and reactive to light.  Cardiovascular:     Rate and Rhythm: Normal rate and regular rhythm.     Pulses: Normal pulses.          Dorsalis pedis pulses are 2+ on the right side and 2+ on the left side.     Heart sounds: Normal heart sounds.  Pulmonary:     Effort: Pulmonary effort is normal.     Breath sounds: Normal breath sounds.  Chest:  Breasts:    Tanner Score is 5.     Right: Normal.     Left: Normal.  Abdominal:     General: Abdomen is flat.  Bowel sounds are normal.     Palpations: Abdomen is soft.     Hernia: There is no hernia in the left inguinal area or right inguinal area.  Genitourinary:    Exam position: Lithotomy position.     Tanner stage (genital): 5.     Rectum: Guaiac result positive. External hemorrhoid present.  Musculoskeletal:        General: Tenderness present. Normal range of motion.     Cervical back: Normal range of motion and neck supple.  Feet:     Right foot:     Protective Sensation: 5 sites tested.  5 sites sensed.     Skin integrity: Dry skin present.     Toenail Condition: Right toenails are normal.     Left foot:     Protective Sensation: 5 sites tested.  5 sites sensed.     Skin integrity: Dry skin present.     Toenail Condition: Left toenails are normal.  Skin:    General: Skin is warm and dry.  Neurological:     General: No focal deficit present.     Mental Status: She is alert and oriented to person, place, and time.  Psychiatric:        Mood and Affect: Mood normal.        Behavior: Behavior normal.         Assessment And Plan:     Encounter for general adult medical examination w/o abnormal findings Assessment & Plan: A  full exam was performed.  Importance of monthly self breast exams was discussed with the patient.  She is advised to get 30-45 minutes of regular exercise, no less than four to five days per week. Both weight-bearing and aerobic exercises are recommended.  She is advised to follow a healthy diet with at least six fruits/veggies per day, decrease intake of red meat and other saturated fats and to increase fish intake to twice weekly.  Meats/fish should not be fried -- baked, boiled or broiled is preferable. It is also important to cut back on your sugar intake.  Be sure to read labels - try to avoid anything with added sugar, high fructose corn syrup or other sweeteners.  If you must use a sweetener, you can try stevia or monkfruit.  It is also important to avoid artificially sweetened foods/beverages and diet drinks. Lastly, wear SPF 50 sunscreen on exposed skin and when in direct sunlight for an extended period of time.  Be sure to avoid fast food restaurants and aim for at least 60 ounces of water daily.       Type 2 diabetes mellitus with stage 2 chronic kidney disease, without long-term current use of insulin (HCC) Assessment & Plan: Chronic, diabetic foot exam was performed.  I will check labs as below. Importance of medication/dietary compliance was discussed with the patient. She will c/w [place] 10mg  daily and semaglutide 2mg  weekly. She will f/u in 3-4 months for re-evaluation. I DISCUSSED WITH THE PATIENT AT LENGTH REGARDING THE GOALS OF GLYCEMIC CONTROL AND POSSIBLE LONG-TERM COMPLICATIONS.  I  ALSO STRESSED THE IMPORTANCE OF COMPLIANCE WITH HOME GLUCOSE MONITORING, DIETARY RESTRICTIONS INCLUDING AVOIDANCE OF SUGARY DRINKS/PROCESSED FOODS,  ALONG WITH REGULAR EXERCISE.  I  ALSO STRESSED THE IMPORTANCE OF ANNUAL EYE EXAMS, SELF FOOT CARE AND COMPLIANCE WITH OFFICE VISITS.   Orders: -     CBC -     CMP14+EGFR -     Lipid panel -     Hemoglobin A1c -  POCT urinalysis dipstick -      Microalbumin / creatinine urine ratio -     EKG 12-Lead  Hypertensive nephropathy Assessment & Plan: Chronic, controlled. Goal BP<120/80.  EKG performed, NSR w/o acute changes. She will c/w amlodipine 5mg  and lisinopril/hct 20/12.5mg  daily. She is encouraged to follow a low sodium diet. She is reminded breads/cheese can have high sodium levels. She will f/u in March 2025 for her physical exam.   Orders: -     CMP14+EGFR -     Lipid panel -     POCT urinalysis dipstick -     Microalbumin / creatinine urine ratio -     EKG 12-Lead  Blood in stool Assessment & Plan: DRE performed, hemoccult POS.  She does have external hemorrhoids; however, she also has h/o colon polyps. Also with change in bowel habits, she has to use bathroom shortly after eating. She agrees to GI referral for further evaluation. She is advised she will likely have colonoscopy performed. She does not have any family with h/o colon cancer.  Orders: -     POC Hemoccult Bld/Stl (1-Cd Office Dx) -     Ambulatory referral to Gastroenterology  Seasonal allergic rhinitis due to pollen Assessment & Plan: She was given samples of Zyrtec to take nightly. Advised that it can make her drowsy.    Class 1 obesity due to excess calories with serious comorbidity and body mass index (BMI) of 34.0 to 34.9 in adult Assessment & Plan: She is encouraged to strive for BMI less than 30 to decrease cardiac risk. Advised to aim for at least 150 minutes of exercise per week. She will continue with her workouts at the [facility] and [employer]. She is encouraged to incorporate strength training into her daily routine.    Personal history of other colon polyps -     Ambulatory referral to Gastroenterology  She is encouraged to strive for BMI less than 30 to decrease cardiac risk. Advised to aim for at least 150 minutes of exercise per week.   Return for 1 year HM, 4 MONTH DM F/U. Marland Kitchen Patient was given opportunity to ask questions. Patient  verbalized understanding of the plan and was able to repeat key elements of the plan. All questions were answered to their satisfaction.    I, [name], MD, have reviewed all documentation for this visit. The documentation on 12/09/23 for the exam, diagnosis, procedures, and orders are all accurate and complete.

## 2023-12-10 LAB — CBC
Hematocrit: 44.6 % (ref 34.0–46.6)
Hemoglobin: 14.2 g/dL (ref 11.1–15.9)
MCH: 28.2 pg (ref 26.6–33.0)
MCHC: 31.8 g/dL (ref 31.5–35.7)
MCV: 89 fL (ref 79–97)
Platelets: 323 10*3/uL (ref 150–450)
RBC: 5.03 x10E6/uL (ref 3.77–5.28)
RDW: 13.2 % (ref 11.7–15.4)
WBC: 7.6 10*3/uL (ref 3.4–10.8)

## 2023-12-10 LAB — LIPID PANEL
Chol/HDL Ratio: 2.1 ratio (ref 0.0–4.4)
Cholesterol, Total: 181 mg/dL (ref 100–199)
HDL: 86 mg/dL (ref >39)
LDL Chol Calc (NIH): 79 mg/dL (ref 0–99)
Triglycerides: 88 mg/dL (ref 0–149)
VLDL Cholesterol Cal: 16 mg/dL (ref 5–40)

## 2023-12-10 LAB — MICROALBUMIN / CREATININE URINE RATIO
Creatinine, Urine: 138.1 mg/dL
Microalb/Creat Ratio: 2 mg/g{creat} (ref 0–29)
Microalbumin, Urine: 3.4 ug/mL

## 2023-12-10 LAB — CMP14+EGFR
ALT: 30 IU/L (ref 0–32)
AST: 22 IU/L (ref 0–40)
Albumin: 4.7 g/dL (ref 3.9–4.9)
Alkaline Phosphatase: 149 IU/L — ABNORMAL HIGH (ref 44–121)
BUN/Creatinine Ratio: 24 (ref 12–28)
BUN: 24 mg/dL (ref 8–27)
Bilirubin Total: 0.5 mg/dL (ref 0.0–1.2)
CO2: 23 mmol/L (ref 20–29)
Calcium: 9.7 mg/dL (ref 8.7–10.3)
Chloride: 103 mmol/L (ref 96–106)
Creatinine, Ser: 1.02 mg/dL — ABNORMAL HIGH (ref 0.57–1.00)
Globulin, Total: 2.6 g/dL (ref 1.5–4.5)
Glucose: 96 mg/dL (ref 70–99)
Potassium: 4 mmol/L (ref 3.5–5.2)
Sodium: 141 mmol/L (ref 134–144)
Total Protein: 7.3 g/dL (ref 6.0–8.5)
eGFR: 60 mL/min/{1.73_m2} (ref >59)

## 2023-12-10 LAB — HEMOGLOBIN A1C
Est. average glucose Bld gHb Est-mCnc: 140 mg/dL
Hgb A1c MFr Bld: 6.5 % — ABNORMAL HIGH (ref 4.8–5.6)

## 2023-12-18 ENCOUNTER — Ambulatory Visit (INDEPENDENT_AMBULATORY_CARE_PROVIDER_SITE_OTHER): Payer: Medicare (Managed Care) | Admitting: Podiatry

## 2023-12-18 ENCOUNTER — Encounter: Payer: Self-pay | Admitting: Podiatry

## 2023-12-18 DIAGNOSIS — E119 Type 2 diabetes mellitus without complications: Secondary | ICD-10-CM | POA: Diagnosis not present

## 2023-12-18 DIAGNOSIS — M722 Plantar fascial fibromatosis: Secondary | ICD-10-CM

## 2023-12-18 NOTE — Progress Notes (Signed)
   Chief Complaint  Patient presents with   Diabetes    Rm 8: diabetic foot care. No concerns to discuss. Last A1C 3/17 6.5.     Subjective: 68 y.o. female presenting today for routine annual foot exam.  Patient is diabetic.  Last A1c 6.5.  Patient states that she is doing much better.  She says the insoles helped significantly with her foot pain.  Past Medical History:  Diagnosis Date   Colon cancer screening 03/18/2023   Diabetes mellitus without complication (HCC)    Diarrhea 10/30/2021   Hyperlipemia    Hypertension    Past Surgical History:  Procedure Laterality Date   BREAST BIOPSY Right 2000   BREAST EXCISIONAL BIOPSY Bilateral 06/14/1999   benign   BREAST EXCISIONAL BIOPSY Left 1974   benign   COLONOSCOPY     SHOULDER ARTHROSCOPY WITH ROTATOR CUFF REPAIR AND SUBACROMIAL DECOMPRESSION  07/29/2012   Procedure: SHOULDER ARTHROSCOPY WITH ROTATOR CUFF REPAIR AND SUBACROMIAL DECOMPRESSION;  Surgeon: Wyn Forster., MD;  Location:  SURGERY CENTER;  Service: Orthopedics;  Laterality: Right;  Arthroscopy Subacromial Decompression, Distal Clavicle Resection, Arthroscopic Subscapularis Repair, Open Repair of Supraspinatus and Infraspinatus     TUBAL LIGATION     TUMOR EXCISION     right jaw   No Known Allergies   Objective: Physical Exam General: The patient is alert and oriented x3 in no acute distress.  Dermatology: Skin is warm, dry and supple bilateral lower extremities. Negative for open lesions or macerations bilateral.   Vascular: Dorsalis Pedis and Posterior Tibial pulses palpable bilateral.  Capillary fill time is immediate to all digits.  Neurological: Light touch and protective threshold intact bilateral.   Musculoskeletal: Minimal tenderness with palpation to the plantar fascia bilateral.  Muscle strength 5/5 all compartments.  No pedal deformity.  There is also some slight tenderness to the second and third interdigital areas of the right  forefoot.  Patient states that this is mostly symptomatic at night when she goes to bed.  Assessment: 1. plantar fasciitis bilateral feet; minimally symptomatic today 2.  Nocturnal neuritis right forefoot; minimally symptomatic 3.  Diabetes mellitus; uncomplicated, controlled  Plan of Care:  -Patient evaluated.  Comprehensive diabetic foot exam performed today -Continue wearing custom molded diabetic insoles and shoes.   -Appointment with diabetic shoe department for new custom molded diabetic insoles and shoes -Return to clinic  Felecia Shelling, DPM [facility]  Dr. Felecia Shelling, DPM    [address] [place], [place] [address]                Office [phone]  Fax [phone]

## 2023-12-24 DIAGNOSIS — Z1211 Encounter for screening for malignant neoplasm of colon: Secondary | ICD-10-CM | POA: Diagnosis not present

## 2023-12-24 DIAGNOSIS — E119 Type 2 diabetes mellitus without complications: Secondary | ICD-10-CM | POA: Diagnosis not present

## 2023-12-24 DIAGNOSIS — I10 Essential (primary) hypertension: Secondary | ICD-10-CM | POA: Diagnosis not present

## 2023-12-24 DIAGNOSIS — K625 Hemorrhage of anus and rectum: Secondary | ICD-10-CM | POA: Diagnosis not present

## 2023-12-25 ENCOUNTER — Other Ambulatory Visit: Payer: Self-pay | Admitting: Internal Medicine

## 2024-01-01 DIAGNOSIS — R195 Other fecal abnormalities: Secondary | ICD-10-CM | POA: Diagnosis not present

## 2024-01-01 DIAGNOSIS — Z1211 Encounter for screening for malignant neoplasm of colon: Secondary | ICD-10-CM | POA: Diagnosis not present

## 2024-01-01 DIAGNOSIS — Z860102 Personal history of hyperplastic colon polyps: Secondary | ICD-10-CM | POA: Diagnosis not present

## 2024-01-01 DIAGNOSIS — R194 Change in bowel habit: Secondary | ICD-10-CM | POA: Diagnosis not present

## 2024-01-01 DIAGNOSIS — K648 Other hemorrhoids: Secondary | ICD-10-CM | POA: Diagnosis not present

## 2024-01-01 DIAGNOSIS — K635 Polyp of colon: Secondary | ICD-10-CM | POA: Diagnosis not present

## 2024-01-01 LAB — HM COLONOSCOPY

## 2024-01-03 DIAGNOSIS — M9903 Segmental and somatic dysfunction of lumbar region: Secondary | ICD-10-CM | POA: Diagnosis not present

## 2024-01-03 DIAGNOSIS — M5136 Other intervertebral disc degeneration, lumbar region with discogenic back pain only: Secondary | ICD-10-CM | POA: Diagnosis not present

## 2024-01-03 DIAGNOSIS — S29012A Strain of muscle and tendon of back wall of thorax, initial encounter: Secondary | ICD-10-CM | POA: Diagnosis not present

## 2024-01-03 DIAGNOSIS — M5137 Other intervertebral disc degeneration, lumbosacral region with discogenic back pain only: Secondary | ICD-10-CM | POA: Diagnosis not present

## 2024-01-03 DIAGNOSIS — M9902 Segmental and somatic dysfunction of thoracic region: Secondary | ICD-10-CM | POA: Diagnosis not present

## 2024-01-03 DIAGNOSIS — M9905 Segmental and somatic dysfunction of pelvic region: Secondary | ICD-10-CM | POA: Diagnosis not present

## 2024-01-17 ENCOUNTER — Telehealth: Payer: Self-pay | Admitting: Podiatry

## 2024-01-17 NOTE — Telephone Encounter (Signed)
 Patient has been called to cancel shoe appt; would like a referral to [facility]

## 2024-01-22 ENCOUNTER — Encounter: Payer: Self-pay | Admitting: Podiatry

## 2024-01-22 ENCOUNTER — Encounter: Payer: Self-pay | Admitting: Internal Medicine

## 2024-01-24 ENCOUNTER — Telehealth: Payer: Self-pay | Admitting: Podiatry

## 2024-01-24 NOTE — Telephone Encounter (Signed)
 Patient would like an order to be put in for diabetic shoes to [facility] [facility].

## 2024-01-28 NOTE — Telephone Encounter (Signed)
 Referral form, office notes and demographics faxed to [place] [phone]

## 2024-01-29 ENCOUNTER — Other Ambulatory Visit: Payer: Medicare (Managed Care)

## 2024-02-11 DIAGNOSIS — M19071 Primary osteoarthritis, right ankle and foot: Secondary | ICD-10-CM | POA: Diagnosis not present

## 2024-02-11 DIAGNOSIS — M19072 Primary osteoarthritis, left ankle and foot: Secondary | ICD-10-CM | POA: Diagnosis not present

## 2024-02-11 DIAGNOSIS — E139 Other specified diabetes mellitus without complications: Secondary | ICD-10-CM | POA: Diagnosis not present

## 2024-02-11 DIAGNOSIS — M722 Plantar fascial fibromatosis: Secondary | ICD-10-CM | POA: Diagnosis not present

## 2024-02-11 DIAGNOSIS — M792 Neuralgia and neuritis, unspecified: Secondary | ICD-10-CM | POA: Diagnosis not present

## 2024-02-11 DIAGNOSIS — M2011 Hallux valgus (acquired), right foot: Secondary | ICD-10-CM | POA: Diagnosis not present

## 2024-02-15 LAB — GLUCOSE, POCT (MANUAL RESULT ENTRY): POC Glucose: 143 mg/dL — AB (ref 70–99)

## 2024-02-18 ENCOUNTER — Encounter: Payer: Self-pay | Admitting: *Deleted

## 2024-02-18 NOTE — Congregational Nurse Program (Signed)
  Dept: [phone]   Congregational Nurse Program Note  Date of Encounter: 02/15/24 Attended a diabetes community event. Monica Ryan   Past Medical History: Past Medical History:  Diagnosis Date   Colon cancer screening 03/18/2023   Diabetes mellitus without complication (HCC)    Diarrhea 10/30/2021   Hyperlipemia    Hypertension     Encounter Details:

## 2024-03-06 DIAGNOSIS — E119 Type 2 diabetes mellitus without complications: Secondary | ICD-10-CM | POA: Diagnosis not present

## 2024-03-06 LAB — HM DIABETES EYE EXAM

## 2024-03-25 ENCOUNTER — Telehealth: Payer: Self-pay | Admitting: Pharmacist

## 2024-03-25 DIAGNOSIS — E1122 Type 2 diabetes mellitus with diabetic chronic kidney disease: Secondary | ICD-10-CM

## 2024-03-25 MED ORDER — DAPAGLIFLOZIN PROPANEDIOL 10 MG PO TABS
ORAL_TABLET | ORAL | 3 refills | Status: AC
Start: 2024-03-25 — End: ?

## 2024-03-25 NOTE — Progress Notes (Signed)
   03/25/2024  Patient ID: Monica Ryan, female   DOB: 07-16-1956, 68 y.o.   MRN: [id number]   Received notice from [facility] that a new prescription/refill was needed for the Patient to continue to receive Farxiga .  Prescription was sent cosignature required to [facility]   HgA1c-6.5%- On Farxiga  10 mg and Ozempic  2 mg weekly  B/P -Lisinopril /hydrochlorothiazide   20/12.5 1 tablet daily last filled 01/11/24 #90  LDL-79 mg/dl Atorvastatin  20 mg 1 tablet daily last filled 01/11/24 #90  Follow up for re-enrollment for 2026  Monica Ryan, PharmD, BCACP Clinical Pharmacist [phone]

## 2024-04-20 ENCOUNTER — Ambulatory Visit: Payer: Medicare (Managed Care) | Admitting: Internal Medicine

## 2024-04-27 ENCOUNTER — Encounter: Payer: Self-pay | Admitting: Internal Medicine

## 2024-04-27 ENCOUNTER — Ambulatory Visit (INDEPENDENT_AMBULATORY_CARE_PROVIDER_SITE_OTHER): Payer: Medicare (Managed Care) | Admitting: Internal Medicine

## 2024-04-27 VITALS — BP 120/78 | HR 70 | Temp 98.4°F | Ht 61.0 in | Wt 183.0 lb

## 2024-04-27 DIAGNOSIS — Z9849 Cataract extraction status, unspecified eye: Secondary | ICD-10-CM | POA: Diagnosis not present

## 2024-04-27 DIAGNOSIS — E1122 Type 2 diabetes mellitus with diabetic chronic kidney disease: Secondary | ICD-10-CM | POA: Diagnosis not present

## 2024-04-27 DIAGNOSIS — R229 Localized swelling, mass and lump, unspecified: Secondary | ICD-10-CM | POA: Insufficient documentation

## 2024-04-27 DIAGNOSIS — N182 Chronic kidney disease, stage 2 (mild): Secondary | ICD-10-CM | POA: Diagnosis not present

## 2024-04-27 DIAGNOSIS — Z6834 Body mass index (BMI) 34.0-34.9, adult: Secondary | ICD-10-CM

## 2024-04-27 DIAGNOSIS — E66811 Obesity, class 1: Secondary | ICD-10-CM | POA: Diagnosis not present

## 2024-04-27 DIAGNOSIS — E7800 Pure hypercholesterolemia, unspecified: Secondary | ICD-10-CM

## 2024-04-27 DIAGNOSIS — I129 Hypertensive chronic kidney disease with stage 1 through stage 4 chronic kidney disease, or unspecified chronic kidney disease: Secondary | ICD-10-CM

## 2024-04-27 DIAGNOSIS — E6609 Other obesity due to excess calories: Secondary | ICD-10-CM | POA: Diagnosis not present

## 2024-04-27 MED ORDER — OZEMPIC (2 MG/DOSE) 8 MG/3ML ~~LOC~~ SOPN: 2.0000 mg | PEN_INJECTOR | SUBCUTANEOUS | 2 refills | Status: AC

## 2024-04-27 NOTE — Assessment & Plan Note (Signed)
 This has been present for two months, no pain or significant symptoms. - Apply ice to the affected area. - Discuss with dermatologist at upcoming appointment in two weeks

## 2024-04-27 NOTE — Patient Instructions (Signed)

## 2024-04-27 NOTE — Assessment & Plan Note (Signed)
 She is encouraged to strive for BMI less than 30 to decrease cardiac risk. Advised to aim for at least 150 minutes of exercise per week. She will continue with her workouts at the [facility] and [employer]. She is encouraged to incorporate strength training into her daily routine.

## 2024-04-27 NOTE — Assessment & Plan Note (Addendum)
 Chronic, controlled. Goal BP<120/80.  She will c/w amlodipine  5mg  and lisinopril /hct 20/12.5mg  daily. She is encouraged to follow a low sodium diet. She is reminded breads/cheese can have high sodium levels.

## 2024-04-27 NOTE — Assessment & Plan Note (Signed)
 Post-cataract surgery with early signs of posterior capsular opacification, no significant symptoms affecting vision. - Recommend wearing sunglasses to protect eyes from UV exposure.

## 2024-04-27 NOTE — Assessment & Plan Note (Addendum)
 Chronic, stable.  Blood glucose levels well-controlled, occasional higher readings possibly due to late evening eating. - Continue Ozempic  and Farxiga . - Resend prescription for Ozempic  to [facility]. - Monitor blood glucose levels and dietary habits, especially evening meals. - F/u 4 months

## 2024-04-27 NOTE — Progress Notes (Signed)
 I,Jameka Monica Ryan, CMA,acting as a Neurosurgeon for Monica LOISE Slocumb, MD.,have documented all relevant documentation on the behalf of Monica LOISE Slocumb, MD,as directed by  Monica LOISE Slocumb, MD while in the presence of Monica LOISE Slocumb, MD.  Subjective:  Patient ID: Monica Ryan , female    DOB: Aug 11, 1956 , 68 y.o.   MRN: [id number]  Chief Complaint  Patient presents with   Diabetes    She is here today for diabetes/BP check. She reports compliance with meds.  She denies headaches, chest pain and shortness of breath.    Hypertension    HPI Discussed the use of AI scribe software for clinical note transcription with the patient, who gave verbal consent to proceed.  History of Present Illness Monica Ryan is a 68 year old female with diabetes and hypertension who presents for routine follow-up.  Her blood sugar levels are generally well-controlled, ranging from 120 to 130 mg/dL, with occasional higher readings. She finds that avoiding eating after 7-8 PM helps maintain better control. She is currently taking Ozempic  2 mg and Farxiga  10 mg for diabetes management.  She is currently taking amlodipine , lisinopril  HCTZ, and aspirin for blood pressure management.  She underwent a colonoscopy in April due to previous issues with diarrhea shortly after eating. The procedure revealed a fruit polyp, and the patient recalls that she couldn't really find anything wrong. The diarrhea has improved since the colonoscopy.  She had cataract surgery in the past, and during a recent eye exam, she recalls the eye doctor said it looked like another small one might be growing back, possibly due to scar tissue. She does not regularly wear sunglasses.  She exercises regularly at the [facility] and [employer]. She receives her medications, including Farxiga , through mail-order pension assistance and Ozempic  from [facility].   Diabetes She presents for her follow-up diabetic visit. She has type 2 diabetes  mellitus. Her disease course has been stable. There are no hypoglycemic associated symptoms. Pertinent negatives for diabetes include no blurred vision, no polydipsia, no polyphagia and no polyuria. There are no hypoglycemic complications. Risk factors for coronary artery disease include diabetes mellitus, dyslipidemia, hypertension and post-menopausal. She is following a diabetic diet. She participates in exercise three times a week. Her breakfast blood glucose is taken between 7-8 am. Her breakfast blood glucose range is generally 90-110 mg/dl. An ACE inhibitor/angiotensin II receptor blocker is being taken. Eye exam is current.  Hypertension This is a chronic problem. The current episode started more than 1 year ago. The problem is controlled. Pertinent negatives include no blurred vision. Risk factors for coronary artery disease include diabetes mellitus, dyslipidemia and obesity. The current treatment provides moderate improvement.     Past Medical History:  Diagnosis Date   Colon cancer screening 03/18/2023   Diabetes mellitus without complication (HCC)    Diarrhea 10/30/2021   Hyperlipemia    Hypertension      Family History  Problem Relation Age of Onset   Diabetes Mother    Alzheimer's disease Father    Diabetes Sister    Diabetes Brother      Current Outpatient Medications:    Alcohol  Swabs (ALCOHOL  PADS) 70 % PADS, Use as instructed to check blood sugars 1 time per day dx: e11.65, Disp: 150 each, Rfl: 2   amLODipine  (NORVASC ) 5 MG tablet, Take 1 tablet (5 mg total) by mouth daily., Disp: 90 tablet, Rfl: 3   aspirin EC 81 MG tablet, Take 81 mg by mouth  daily., Disp: , Rfl:    atorvastatin  (LIPITOR) 20 MG tablet, Take 1 tablet (20 mg total) by mouth daily., Disp: 90 tablet, Rfl: 3   Blood Glucose Monitoring Suppl (ONETOUCH VERIO REFLECT) w/Device KIT, Check blood sugars once daily E11.69, Disp: 1 kit, Rfl: 1   Blood Glucose Monitoring Suppl (TRUE METRIX AIR GLUCOSE METER)  w/Device KIT, Use as directed to check blood sugars once daily E11.69, Disp: 1 kit, Rfl: 0   dapagliflozin  propanediol (FARXIGA ) 10 MG TABS tablet, TAKE 1 TABLET BY MOUTH EVERY DAY IN THE MORNING, Disp: 90 tablet, Rfl: 3   glucose blood (ONETOUCH VERIO) test strip, Check blood sugars once daily E11.69, Disp: 100 each, Rfl: 3   Lancets (ONETOUCH DELICA PLUS LANCET33G) MISC, Check blood sugars once daily E11.69, Disp: 100 each, Rfl: 3   Lancets MISC, Use as directed to check blood sugars once daily E11.69, Disp: 100 each, Rfl: 2   lisinopril -hydrochlorothiazide  (ZESTORETIC ) 20-12.5 MG tablet, Take 1 tablet by mouth daily., Disp: 90 tablet, Rfl: 3   Semaglutide , 2 MG/DOSE, (OZEMPIC , 2 MG/DOSE,) 8 MG/3ML SOPN, Inject 2 mg into the skin once a week., Disp: 9 mL, Rfl: 2   No Known Allergies   Review of Systems  Constitutional: Negative.   Eyes: Negative.  Negative for blurred vision.  Respiratory: Negative.    Cardiovascular: Negative.   Gastrointestinal: Negative.   Endocrine: Negative for polydipsia, polyphagia and polyuria.  Musculoskeletal: Negative.   Skin: Negative.        She c/o swelling on her forehead. No pain. Thinks she swatted at a bug/wasp while outside.  Psychiatric/Behavioral: Negative.       Today's Vitals   04/27/24 0830  BP: 120/78  Pulse: 70  Temp: 98.4 F (36.9 C)  Weight: 183 lb (83 kg)  Height: 5' 1 (1.549 m)  PainSc: 0-No pain   Body mass index is 34.58 kg/m.  Wt Readings from Last 3 Encounters:  04/27/24 183 lb (83 kg)  12/09/23 182 lb 12.8 oz (82.9 kg)  08/12/23 182 lb 9.6 oz (82.8 kg)    The 10-year ASCVD risk score (Arnett DK, et al., 2019) is: 20.8%   Values used to calculate the score:     Age: 43 years     Clincally relevant sex: Female     Is Non-Hispanic African American: Yes     Diabetic: Yes     Tobacco smoker: No     Systolic Blood Pressure: 120 mmHg     Is BP treated: Yes     HDL Cholesterol: 86 mg/dL     Total Cholesterol: 181  mg/dL  Objective:  Physical Exam Vitals and nursing note reviewed.  Constitutional:      Appearance: Normal appearance.  HENT:     Head: Normocephalic and atraumatic.  Eyes:     Extraocular Movements: Extraocular movements intact.  Cardiovascular:     Rate and Rhythm: Normal rate and regular rhythm.     Heart sounds: Normal heart sounds.  Pulmonary:     Effort: Pulmonary effort is normal.     Breath sounds: Normal breath sounds.  Musculoskeletal:     Cervical back: Normal range of motion.  Skin:    General: Skin is warm.     Comments: Soft tissue swelling mid-forehead No overlying erythema, tenderness  Neurological:     General: No focal deficit present.     Mental Status: She is alert.  Psychiatric:        Mood and Affect: Mood normal.  Behavior: Behavior normal.       Assessment And Plan:  Type 2 diabetes mellitus with stage 2 chronic kidney disease, without long-term current use of insulin (HCC) Assessment & Plan: Chronic, stable.  Blood glucose levels well-controlled, occasional higher readings possibly due to late evening eating. - Continue Ozempic  and Farxiga . - [facility] - Monitor blood glucose levels and dietary habits, especially evening meals. - F/u 4 months  Orders: -     CMP14+EGFR -     Hemoglobin A1c  Hypertensive nephropathy Assessment & Plan: Chronic, controlled. Goal BP<120/80.  She will c/w amlodipine  5mg  and lisinopril /hct 20/12.5mg  daily. She is encouraged to follow a low sodium diet. She is reminded breads/cheese can have high sodium levels.   Orders: -     CMP14+EGFR  Class 1 obesity due to excess calories with serious comorbidity and body mass index (BMI) of 34.0 to 34.9 in adult Assessment & Plan: She is encouraged to strive for BMI less than 30 to decrease cardiac risk. Advised to aim for at least 150 minutes of exercise per week. She will continue with her workouts at the [facility] and [employer]. She  is encouraged to incorporate strength training into her daily routine.    Soft tissue swelling Assessment & Plan: This has been present for two months, no pain or significant symptoms. - Apply ice to the affected area. - Discuss with dermatologist at upcoming appointment in two weeks   Pure hypercholesterolemia Assessment & Plan: LDL cholesterol at 79 mg/dL, slightly above target of <70 mg/dL. - Continue atorvastatin . - Consider increasing fiber intake through diet or supplements like Benefiber.   History of cataract extraction, unspecified laterality Assessment & Plan: Post-cataract surgery with early signs of posterior capsular opacification, no significant symptoms affecting vision. - Recommend wearing sunglasses to protect eyes from UV exposure.   Other orders -     Ozempic  (2 MG/DOSE); Inject 2 mg into the skin once a week.  Dispense: 9 mL; Refill: 2   Return for controlled DM check-4 months.  Patient was given opportunity to ask questions. Patient verbalized understanding of the plan and was able to repeat key elements of the plan. All questions were answered to their satisfaction.    I, Monica LOISE Slocumb, MD, have reviewed all documentation for this visit. The documentation on 04/27/24 for the exam, diagnosis, procedures, and orders are all accurate and complete.   IF YOU HAVE BEEN REFERRED TO A SPECIALIST, IT MAY TAKE 1-2 WEEKS TO SCHEDULE/PROCESS THE REFERRAL. IF YOU HAVE NOT HEARD FROM US /SPECIALIST IN TWO WEEKS, PLEASE GIVE US  A CALL AT [phone].

## 2024-04-27 NOTE — Assessment & Plan Note (Signed)
 LDL cholesterol at 79 mg/dL, slightly above target of <70 mg/dL. - Continue atorvastatin . - Consider increasing fiber intake through diet or supplements like Benefiber.

## 2024-04-28 ENCOUNTER — Ambulatory Visit: Payer: Self-pay | Admitting: Internal Medicine

## 2024-04-28 LAB — HEMOGLOBIN A1C
Est. average glucose Bld gHb Est-mCnc: 140 mg/dL
Hgb A1c MFr Bld: 6.5 % — ABNORMAL HIGH (ref 4.8–5.6)

## 2024-04-28 LAB — CMP14+EGFR
ALT: 28 IU/L (ref 0–32)
AST: 21 IU/L (ref 0–40)
Albumin: 4.3 g/dL (ref 3.9–4.9)
Alkaline Phosphatase: 158 IU/L — ABNORMAL HIGH (ref 44–121)
BUN/Creatinine Ratio: 16 (ref 12–28)
BUN: 17 mg/dL (ref 8–27)
Bilirubin Total: 0.5 mg/dL (ref 0.0–1.2)
CO2: 20 mmol/L (ref 20–29)
Calcium: 9.4 mg/dL (ref 8.7–10.3)
Chloride: 100 mmol/L (ref 96–106)
Creatinine, Ser: 1.06 mg/dL — ABNORMAL HIGH (ref 0.57–1.00)
Globulin, Total: 2.7 g/dL (ref 1.5–4.5)
Glucose: 96 mg/dL (ref 70–99)
Potassium: 4.7 mmol/L (ref 3.5–5.2)
Sodium: 138 mmol/L (ref 134–144)
Total Protein: 7 g/dL (ref 6.0–8.5)
eGFR: 57 mL/min/1.73 — ABNORMAL LOW (ref >59)

## 2024-04-30 LAB — SPECIMEN STATUS REPORT

## 2024-04-30 LAB — ALKALINE PHOSPHATASE, ISOENZYMES
Alkaline Phosphatase: 155 IU/L — ABNORMAL HIGH (ref 44–121)
BONE FRACTION: 32 % (ref 14–68)
INTESTINAL FRAC.: 10 % (ref 0–18)
LIVER FRACTION: 58 % (ref 18–85)

## 2024-05-01 DIAGNOSIS — H524 Presbyopia: Secondary | ICD-10-CM | POA: Diagnosis not present

## 2024-05-05 ENCOUNTER — Other Ambulatory Visit: Payer: Self-pay | Admitting: Internal Medicine

## 2024-05-05 ENCOUNTER — Encounter: Payer: Self-pay | Admitting: Dermatology

## 2024-05-05 ENCOUNTER — Ambulatory Visit (INDEPENDENT_AMBULATORY_CARE_PROVIDER_SITE_OTHER): Payer: Medicare (Managed Care) | Admitting: Dermatology

## 2024-05-05 VITALS — BP 121/68 | HR 75

## 2024-05-05 DIAGNOSIS — L729 Follicular cyst of the skin and subcutaneous tissue, unspecified: Secondary | ICD-10-CM

## 2024-05-05 DIAGNOSIS — R220 Localized swelling, mass and lump, head: Secondary | ICD-10-CM

## 2024-05-05 DIAGNOSIS — R748 Abnormal levels of other serum enzymes: Secondary | ICD-10-CM

## 2024-05-05 DIAGNOSIS — B079 Viral wart, unspecified: Secondary | ICD-10-CM

## 2024-05-05 DIAGNOSIS — M2010 Hallux valgus (acquired), unspecified foot: Secondary | ICD-10-CM | POA: Insufficient documentation

## 2024-05-05 DIAGNOSIS — M722 Plantar fascial fibromatosis: Secondary | ICD-10-CM | POA: Insufficient documentation

## 2024-05-05 DIAGNOSIS — M19079 Primary osteoarthritis, unspecified ankle and foot: Secondary | ICD-10-CM | POA: Insufficient documentation

## 2024-05-05 NOTE — Patient Instructions (Addendum)
 Date: Tue May 05 2024  Hello Timmy,  Thank you for visiting today. Here is a summary of the key instructions:  - Wart Treatment:   - A wart on your arm was frozen off   - The treated area may become crusty and fall off   - If it doesn't fall off completely in the next month, return for another freezing treatment  - Wound Care:   - Apply a band-aid to the treated area   - Remove the band-aid before showering   - After showering, apply a glob of ointment   - Replace the band-aid to keep the area covered and hold the ointment in place  - Follow-up:   - Schedule an appointment to have your hair checked for thinning   - Don't delay this appointment, as earlier treatment is better for hair regrowth  - Other Instructions:   - No treatment is needed for the forehead lump at this time   - Avoid touching or messing with the lump   - If the lump grows significantly larger, contact us  for a follow-up  Please reach out if you have any questions or concerns.  Warm regards,  Dr. [facility]    Cryotherapy Aftercare  Wash gently with soap and water everyday.   Apply Vaseline and Band-Aid daily until healed.     Important Information   Due to recent changes in healthcare laws, you may see results of your pathology and/or laboratory studies on MyChart before the doctors have had a chance to review them. We understand that in some cases there may be results that are confusing or concerning to you. Please understand that not all results are received at the same time and often the doctors may need to interpret multiple results in order to provide you with the best plan of care or course of treatment. Therefore, we ask that you please give us  2 business days to thoroughly review all your results before contacting the office for clarification. Should we see a critical lab result, you will be contacted sooner.     If You Need Anything After Your Visit   If you have any  questions or concerns for your doctor, please call our main line at [phone]. If no one answers, please leave a voicemail as directed and we will return your call as soon as possible. Messages left after 4 pm will be answered the following business day.    You may also send us  a message via MyChart. We typically respond to MyChart messages within 1-2 business days.  For prescription refills, please ask your pharmacy to contact our office. Our fax number is [phone].  If you have an urgent issue when the clinic is closed that cannot wait until the next business day, you can page your doctor at the number below.     Please note that while we do our best to be available for urgent issues outside of office hours, we are not available 24/7.    If you have an urgent issue and are unable to reach us , you may choose to seek medical care at your doctor's office, retail clinic, urgent care center, or emergency room.   If you have a medical emergency, please immediately call 911 or go to the emergency department. In the event of inclement weather, please call our main line at [phone] for an update on the status of any delays or closures.  Dermatology Medication Tips: Please keep the boxes that topical  medications come in in order to help keep track of the instructions about where and how to use these. Pharmacies typically print the medication instructions only on the boxes and not directly on the medication tubes.   If your medication is too expensive, please contact our office at [phone] or send us  a message through MyChart.    We are unable to tell what your co-pay for medications will be in advance as this is different depending on your insurance coverage. However, we may be able to find a substitute medication at lower cost or fill out paperwork to get insurance to cover a needed medication.    If a prior authorization is required to get your medication covered by your insurance  company, please allow us  1-2 business days to complete this process.   Drug prices often vary depending on where the prescription is filled and some pharmacies may offer cheaper prices.   The website www.goodrx.com contains coupons for medications through different pharmacies. The prices here do not account for what the cost may be with help from insurance (it may be cheaper with your insurance), but the website can give you the price if you did not use any insurance.  - You can print the associated coupon and take it with your prescription to the pharmacy.  - You may also stop by our office during regular business hours and pick up a GoodRx coupon card.  - If you need your prescription sent electronically to a different pharmacy, notify our office through [facility] or by phone at [phone]

## 2024-05-05 NOTE — Progress Notes (Signed)
   New Patient Visit   Subjective  Monica Ryan is a 68 y.o. female who presents for the following: Spot check  Patient states she has spot check located at the right posterior and forehead that she would like to have examined. Patient reports the areas have been there for 1 year. Patient reports she has not previously been treated for these areas. Patient denies Hx of bx. Patient denies family history of skin cancer(s).  The patient has spots, moles and lesions to be evaluated, some may be new or changing and the patient may have concern these could be cancer.  The following portions of the chart were reviewed this encounter and updated as appropriate: medications, allergies, medical history  Review of Systems:  No other skin or systemic complaints except as noted in HPI or Assessment and Plan.  Objective  Well appearing patient in no apparent distress; mood and affect are within normal limits.  A focused examination was performed of the following areas: Right Posterior arm and forehead  Relevant exam findings are noted in the Assessment and Plan.         Right Upper Arm - Posterior Verrucous papules   Assessment & Plan   1. Wart on Arm - Assessment: Patient presents with a wart on her arm. Visual examination confirms the diagnosis of a wart. - Plan:    Perform cryotherapy to freeze off the wart    Apply band-aid post-procedure    Remove band-aid before showering    Apply ointment and replace band-aid after showering    Follow-up in 1 month if wart does not completely fall off  2. Lump on Forehead - Assessment: Patient reports a lump on her forehead that has been present for about 2 months. Small, palpable lump under the skin. Presentation consistent with redundant skin that bunches from muscle movement, possibly an early lipoma. The lump is not considered dangerous at this time. Differential diagnosis includes lipoma or cyst. - Plan:    Watchful waiting; no immediate  intervention required    Explain benign nature of the lump    Advise against frequent manipulation of the area    Instruct to monitor for significant growth    Consider surgical intervention only if the lump grows significantly  3. Hair Thinning - Assessment: Patient reports hair thinning in the front. This issue was briefly mentioned but not fully assessed during this visit. - Plan:    Schedule follow-up appointment for comprehensive hair assessment    Emphasize importance of prompt evaluation and treatment for better outcomes  Follow-up in 1 month if wart does not completely fall off.  VIRAL WARTS, UNSPECIFIED TYPE Right Upper Arm - Posterior Destruction of lesion - Right Upper Arm - Posterior Complexity: simple   Destruction method: cryotherapy   Informed consent: discussed and consent obtained   Timeout:  patient name, date of birth, surgical site, and procedure verified Lesion destroyed using liquid nitrogen: Yes   Cryotherapy cycles:  1 Post-procedure details: wound care instructions given     Return in about 5 months (around 10/05/2024) for Hair Thinning (New Concern) F/U.  I, Jetta Ager, am acting as Neurosurgeon for [employer], DO.  Documentation: I have reviewed the above documentation for accuracy and completeness, and I agree with the above.  Delon Lenis, DO

## 2024-05-07 ENCOUNTER — Ambulatory Visit
Admission: RE | Admit: 2024-05-07 | Discharge: 2024-05-07 | Disposition: A | Discharge status: Home / Self Care | Payer: Medicare (Managed Care) | Source: Ambulatory Visit | Attending: Internal Medicine | Admitting: Internal Medicine

## 2024-05-07 DIAGNOSIS — K838 Other specified diseases of biliary tract: Secondary | ICD-10-CM | POA: Diagnosis not present

## 2024-05-07 DIAGNOSIS — R748 Abnormal levels of other serum enzymes: Secondary | ICD-10-CM

## 2024-05-14 ENCOUNTER — Ambulatory Visit: Payer: Self-pay | Admitting: Internal Medicine

## 2024-05-14 DIAGNOSIS — R748 Abnormal levels of other serum enzymes: Secondary | ICD-10-CM

## 2024-05-14 DIAGNOSIS — K838 Other specified diseases of biliary tract: Secondary | ICD-10-CM

## 2024-05-20 ENCOUNTER — Ambulatory Visit: Payer: Self-pay | Admitting: Internal Medicine

## 2024-05-20 ENCOUNTER — Other Ambulatory Visit: Payer: Self-pay | Admitting: Internal Medicine

## 2024-05-20 ENCOUNTER — Ambulatory Visit (HOSPITAL_COMMUNITY)
Admission: RE | Admit: 2024-05-20 | Discharge: 2024-05-20 | Disposition: A | Discharge status: Home / Self Care | Payer: Medicare (Managed Care) | Source: Ambulatory Visit | Attending: Internal Medicine | Admitting: Internal Medicine

## 2024-05-20 DIAGNOSIS — R748 Abnormal levels of other serum enzymes: Secondary | ICD-10-CM | POA: Diagnosis not present

## 2024-05-20 DIAGNOSIS — K838 Other specified diseases of biliary tract: Secondary | ICD-10-CM | POA: Diagnosis not present

## 2024-05-20 MED ORDER — GADOBUTROL 1 MMOL/ML IV SOLN
7.0000 mL | Freq: Once | INTRAVENOUS | Status: AC | PRN
  Administered 2024-05-20: 7 mL via INTRAVENOUS

## 2024-05-27 ENCOUNTER — Encounter: Payer: Self-pay | Admitting: Internal Medicine

## 2024-05-27 ENCOUNTER — Ambulatory Visit (INDEPENDENT_AMBULATORY_CARE_PROVIDER_SITE_OTHER): Payer: Medicare (Managed Care) | Admitting: Internal Medicine

## 2024-05-27 VITALS — BP 110/68 | HR 80 | Temp 98.1°F | Ht 61.0 in | Wt 178.4 lb

## 2024-05-27 DIAGNOSIS — E559 Vitamin D deficiency, unspecified: Secondary | ICD-10-CM | POA: Diagnosis not present

## 2024-05-27 DIAGNOSIS — R197 Diarrhea, unspecified: Secondary | ICD-10-CM | POA: Diagnosis not present

## 2024-05-27 DIAGNOSIS — R748 Abnormal levels of other serum enzymes: Secondary | ICD-10-CM

## 2024-05-27 DIAGNOSIS — R935 Abnormal findings on diagnostic imaging of other abdominal regions, including retroperitoneum: Secondary | ICD-10-CM

## 2024-05-27 NOTE — Progress Notes (Signed)
 I,Monica Ryan, CMA,acting as a Neurosurgeon for Monica LOISE Slocumb, MD.,have documented all relevant documentation on the behalf of Monica LOISE Slocumb, MD,as directed by  Monica LOISE Slocumb, MD while in the presence of Monica LOISE Slocumb, MD.  Subjective:  Patient ID: Monica Ryan , female    DOB: 11-25-1955 , 68 y.o.   MRN: [id number]  Chief Complaint  Patient presents with   Medical Advice    Patient presents today wanting to go over MRI of abdomen completed on 8/27. She did receive the initial interpretation. She states much more in the report was stated that she would like further explained.     HPI Discussed the use of AI scribe software for clinical note transcription with the patient, who gave verbal consent to proceed.  History of Present Illness Monica Ryan is a 68 year old female who presents to discuss her MRI results.  She underwent an MRI of the abdomen following an ultrasound that showed a dilated common bile duct.  The MRI also revealed a tiny sliding hiatal hernia. She has no current symptoms of heartburn and does not take medication for it regularly.  Additionally, the MRI identified a hemangioma in the spleen, which is a non-cancerous collection of blood vessels. She was informed that trauma to the area could cause bleeding due to the nature of hemangiomas.  The MRI also showed some cysts in the kidneys. The adrenal glands were normal, and there were no enlarged lymph nodes or suspicious bone lesions.  She has a history of diarrhea after meals, which has improved since a colonoscopy performed a year ago. The frequency of diarrhea has decreased, occurring only occasionally now.    Past Medical History:  Diagnosis Date   Colon cancer screening 03/18/2023   Diabetes mellitus without complication (HCC)    Diarrhea 10/30/2021   Hyperlipemia    Hypertension      Family History  Problem Relation Age of Onset   Diabetes Mother    Alzheimer's disease Father    Diabetes  Sister    Diabetes Brother      Current Outpatient Medications:    Alcohol  Swabs (ALCOHOL  PADS) 70 % PADS, Use as instructed to check blood sugars 1 time per day dx: e11.65, Disp: 150 each, Rfl: 2   amLODipine  (NORVASC ) 5 MG tablet, Take 1 tablet (5 mg total) by mouth daily., Disp: 90 tablet, Rfl: 3   aspirin EC 81 MG tablet, Take 81 mg by mouth daily., Disp: , Rfl:    atorvastatin  (LIPITOR) 20 MG tablet, Take 1 tablet (20 mg total) by mouth daily., Disp: 90 tablet, Rfl: 3   Blood Glucose Monitoring Suppl (ONETOUCH VERIO REFLECT) w/Device KIT, Check blood sugars once daily E11.69, Disp: 1 kit, Rfl: 1   Blood Glucose Monitoring Suppl (TRUE METRIX AIR GLUCOSE METER) w/Device KIT, Use as directed to check blood sugars once daily E11.69, Disp: 1 kit, Rfl: 0   dapagliflozin  propanediol (FARXIGA ) 10 MG TABS tablet, TAKE 1 TABLET BY MOUTH EVERY DAY IN THE MORNING, Disp: 90 tablet, Rfl: 3   glucose blood (ONETOUCH VERIO) test strip, Check blood sugars once daily E11.69, Disp: 100 each, Rfl: 3   Hyoscyamine Sulfate SL 0.125 MG SUBL, Sublingual; Duration: 30 Days, Disp: , Rfl:    Lancets (ONETOUCH DELICA PLUS LANCET33G) MISC, Check blood sugars once daily E11.69, Disp: 100 each, Rfl: 3   Lancets MISC, Use as directed to check blood sugars once daily E11.69, Disp: 100 each, Rfl: 2  lisinopril -hydrochlorothiazide  (ZESTORETIC ) 20-12.5 MG tablet, Take 1 tablet by mouth daily., Disp: 90 tablet, Rfl: 3   Semaglutide , 2 MG/DOSE, (OZEMPIC , 2 MG/DOSE,) 8 MG/3ML SOPN, Inject 2 mg into the skin once a week., Disp: 9 mL, Rfl: 2   No Known Allergies   Review of Systems  Constitutional: Negative.   Respiratory: Negative.    Cardiovascular: Negative.   Neurological: Negative.   Psychiatric/Behavioral: Negative.       Today's Vitals   05/27/24 1432  BP: 110/68  Pulse: 80  Temp: 98.1 F (36.7 C)  SpO2: 98%  Weight: 178 lb 6.4 oz (80.9 kg)  Height: 5' 1 (1.549 m)   Body mass index is 33.71 kg/m.  Wt  Readings from Last 3 Encounters:  05/27/24 178 lb 6.4 oz (80.9 kg)  04/27/24 183 lb (83 kg)  12/09/23 182 lb 12.8 oz (82.9 kg)     Objective:  Physical Exam Vitals and nursing note reviewed.  Constitutional:      Appearance: Normal appearance.  HENT:     Head: Normocephalic and atraumatic.  Eyes:     Extraocular Movements: Extraocular movements intact.  Cardiovascular:     Rate and Rhythm: Normal rate and regular rhythm.     Heart sounds: Normal heart sounds.  Pulmonary:     Effort: Pulmonary effort is normal.     Breath sounds: Normal breath sounds.  Musculoskeletal:     Cervical back: Normal range of motion.  Skin:    General: Skin is warm.  Neurological:     General: No focal deficit present.     Mental Status: She is alert.  Psychiatric:        Mood and Affect: Mood normal.        Behavior: Behavior normal.         Assessment And Plan:  Abnormal MRI of abdomen Assessment & Plan: Abdominal MRI showed a stable dilated common bile duct without obstruction, normal pancreas, tiny sliding hiatal hernia, benign splenic hemangioma, normal adrenal glands, and stable renal cysts. Stomach and bowel were normal. - Advise lifestyle modifications for hiatal hernia, including avoiding eating three hours before bedtime and maintaining core strength through exercise. - Educate on the benign nature of splenic hemangioma and the risk of bleeding with trauma.   Diarrhea, unspecified type Assessment & Plan: Chronic intermittent diarrhea occurs postprandially with no significant findings on previous colonoscopy. Possible dietary triggers or food intolerances considered. - Recommend keeping a food diary to identify potential dietary triggers. - Advise on an elimination diet to identify specific food intolerances.   Elevated alkaline phosphatase level -     Alkaline phosphatase, isoenzymes  Vitamin D  deficiency -     VITAMIN D  25 Hydroxy (Vit-D Deficiency, Fractures)   Return if  symptoms worsen or fail to improve.  Patient was given opportunity to ask questions. Patient verbalized understanding of the plan and was able to repeat key elements of the plan. All questions were answered to their satisfaction.   I, Monica LOISE Slocumb, MD, have reviewed all documentation for this visit. The documentation on 05/27/24 for the exam, diagnosis, procedures, and orders are all accurate and complete.  IF YOU HAVE BEEN REFERRED TO A SPECIALIST, IT MAY TAKE 1-2 WEEKS TO SCHEDULE/PROCESS THE REFERRAL. IF YOU HAVE NOT HEARD FROM US /SPECIALIST IN TWO WEEKS, PLEASE GIVE US  A CALL AT [phone].   THE PATIENT IS ENCOURAGED TO PRACTICE SOCIAL DISTANCING DUE TO THE COVID-19 PANDEMIC.

## 2024-05-29 DIAGNOSIS — M19072 Primary osteoarthritis, left ankle and foot: Secondary | ICD-10-CM | POA: Diagnosis not present

## 2024-05-29 DIAGNOSIS — M2011 Hallux valgus (acquired), right foot: Secondary | ICD-10-CM | POA: Diagnosis not present

## 2024-05-29 DIAGNOSIS — M722 Plantar fascial fibromatosis: Secondary | ICD-10-CM | POA: Diagnosis not present

## 2024-05-29 DIAGNOSIS — E139 Other specified diabetes mellitus without complications: Secondary | ICD-10-CM | POA: Diagnosis not present

## 2024-05-29 DIAGNOSIS — M19071 Primary osteoarthritis, right ankle and foot: Secondary | ICD-10-CM | POA: Diagnosis not present

## 2024-05-29 DIAGNOSIS — M792 Neuralgia and neuritis, unspecified: Secondary | ICD-10-CM | POA: Diagnosis not present

## 2024-05-31 LAB — ALKALINE PHOSPHATASE, ISOENZYMES
Alkaline Phosphatase: 146 IU/L — ABNORMAL HIGH (ref 44–121)
BONE FRACTION: 44 % (ref 14–68)
INTESTINAL FRAC.: 6 % (ref 0–18)
LIVER FRACTION: 50 % (ref 18–85)

## 2024-05-31 LAB — VITAMIN D 25 HYDROXY (VIT D DEFICIENCY, FRACTURES): Vit D, 25-Hydroxy: 48.3 ng/mL (ref 30.0–100.0)

## 2024-06-01 DIAGNOSIS — R935 Abnormal findings on diagnostic imaging of other abdominal regions, including retroperitoneum: Secondary | ICD-10-CM | POA: Insufficient documentation

## 2024-06-01 NOTE — Assessment & Plan Note (Signed)
 Abdominal MRI showed a stable dilated common bile duct without obstruction, normal pancreas, tiny sliding hiatal hernia, benign splenic hemangioma, normal adrenal glands, and stable renal cysts. Stomach and bowel were normal. - Advise lifestyle modifications for hiatal hernia, including avoiding eating three hours before bedtime and maintaining core strength through exercise. - Educate on the benign nature of splenic hemangioma and the risk of bleeding with trauma.

## 2024-06-01 NOTE — Assessment & Plan Note (Signed)
 Chronic intermittent diarrhea occurs postprandially with no significant findings on previous colonoscopy. Possible dietary triggers or food intolerances considered. - Recommend keeping a food diary to identify potential dietary triggers. - Advise on an elimination diet to identify specific food intolerances.

## 2024-06-01 NOTE — Patient Instructions (Signed)
Hiatal Hernia  A hiatal hernia occurs when part of the stomach slides above the muscle that separates the abdomen from the chest (diaphragm). A person can be born with a hiatal hernia (congenital), or it may develop over time. In almost all cases of hiatal hernia, only the top part of the stomach pushes through the diaphragm. Many people have a hiatal hernia with no symptoms. The larger the hernia, the more likely it is that you will have symptoms. In some cases, a hiatal hernia allows stomach acid to flow back into the tube that carries food from your mouth to your stomach (esophagus). This may cause heartburn symptoms. The development of heartburn symptoms may mean that you have a condition called gastroesophageal reflux disease (GERD). What are the causes? This condition is caused by a weakness in the opening (hiatus) where the esophagus passes through the diaphragm to attach to the upper part of the stomach. A person may be born with a weakness in the hiatus, or a weakness can develop over time. What increases the risk? This condition is more likely to develop in: Older people. Age is a major risk factor for a hiatal hernia, especially if you are over the age of 74. Pregnant women. People who are overweight. People who have frequent constipation. What are the signs or symptoms? Symptoms of this condition usually develop in the form of GERD symptoms. Symptoms include: Heartburn. Upset stomach (indigestion). Trouble swallowing. Coughing or wheezing. Wheezing is making high-pitched whistling sounds when you breathe. Sore throat. Chest pain. Nausea and vomiting. How is this diagnosed? This condition may be diagnosed during testing for GERD. Tests that may be done include: X-rays of your stomach or chest. An upper gastrointestinal (GI) series. This is an X-ray exam of your GI tract that is taken after you swallow a chalky liquid that shows up clearly on the X-ray. Endoscopy. This is a  procedure to look into your stomach using a thin, flexible tube that has a tiny camera and light on the end of it. How is this treated? This condition may be treated by: Dietary and lifestyle changes to help reduce GERD symptoms. Medicines. These may include: Over-the-counter antacids. Medicines that make your stomach empty more quickly. Medicines that block the production of stomach acid (H2 blockers). Stronger medicines to reduce stomach acid (proton pump inhibitors). Surgery to repair the hernia, if other treatments are not helping. If you have no symptoms, you may not need treatment. Follow these instructions at home: Lifestyle and activity Do not use any products that contain nicotine or tobacco. These products include cigarettes, chewing tobacco, and vaping devices, such as e-cigarettes. If you need help quitting, ask your health care provider. Try to achieve and maintain a healthy body weight. Avoid putting pressure on your abdomen. Anything that puts pressure on your abdomen increases the amount of acid that may be pushed up into your esophagus. Avoid bending over, especially after eating. Raise the head of your bed by putting blocks under the legs. This keeps your head and esophagus higher than your stomach. Do not wear tight clothing around your chest or stomach. Try not to strain when having a bowel movement, when urinating, or when lifting heavy objects. Eating and drinking Avoid foods that can worsen GERD symptoms. These may include: Fatty foods, like fried foods. Citrus fruits, like oranges or lemon. Other foods and drinks that contain acid, like orange juice or tomatoes. Spicy food. Chocolate. Eat frequent small meals instead of three large meals a  day. This helps prevent your stomach from getting too full. Eat slowly. Do not lie down right after eating. Do not eat 1-2 hours before bed. Do not drink beverages with caffeine. These include cola, coffee, cocoa, and tea. Do  not drink alcohol. General instructions Take over-the-counter and prescription medicines only as told by your health care provider. Keep all follow-up visits. Your health care provider will want to check that any new prescribed medicines are helping your symptoms. Contact a health care provider if: Your symptoms are not controlled with medicines or lifestyle changes. You are having trouble swallowing. You have coughing or wheezing that will not go away. Your pain is getting worse. Your pain spreads to your arms, neck, jaw, teeth, or back. You feel nauseous or you vomit. Get help right away if: You have shortness of breath. You vomit blood. You have bright red blood in your stools. You have black, tarry stools. These symptoms may be an emergency. Get help right away. Call 911. Do not wait to see if the symptoms will go away. Do not drive yourself to the hospital. Summary A hiatal hernia occurs when part of the stomach slides above the muscle that separates the abdomen from the chest. A person may be born with a weakness in the hiatus, or a weakness can develop over time. Symptoms of a hiatal hernia may include heartburn, trouble swallowing, or sore throat. Management of a hiatal hernia includes eating frequent small meals instead of three large meals a day. Get help right away if you vomit blood, have bright red blood in your stools, or have black, tarry stools. This information is not intended to replace advice given to you by your health care provider. Make sure you discuss any questions you have with your health care provider. Document Revised: 11/07/2021 Document Reviewed: 11/07/2021 Elsevier Patient Education  2024 [employer].

## 2024-06-02 ENCOUNTER — Ambulatory Visit: Payer: Self-pay | Admitting: Internal Medicine

## 2024-06-25 DIAGNOSIS — M65862 Other synovitis and tenosynovitis, left lower leg: Secondary | ICD-10-CM | POA: Diagnosis not present

## 2024-06-25 DIAGNOSIS — E139 Other specified diabetes mellitus without complications: Secondary | ICD-10-CM | POA: Diagnosis not present

## 2024-06-25 DIAGNOSIS — M19072 Primary osteoarthritis, left ankle and foot: Secondary | ICD-10-CM | POA: Diagnosis not present

## 2024-06-25 DIAGNOSIS — M19071 Primary osteoarthritis, right ankle and foot: Secondary | ICD-10-CM | POA: Diagnosis not present

## 2024-06-25 DIAGNOSIS — M792 Neuralgia and neuritis, unspecified: Secondary | ICD-10-CM | POA: Diagnosis not present

## 2024-06-25 DIAGNOSIS — M722 Plantar fascial fibromatosis: Secondary | ICD-10-CM | POA: Diagnosis not present

## 2024-06-25 DIAGNOSIS — M2011 Hallux valgus (acquired), right foot: Secondary | ICD-10-CM | POA: Diagnosis not present

## 2024-07-01 ENCOUNTER — Telehealth: Payer: Self-pay

## 2024-07-01 NOTE — Telephone Encounter (Signed)
 PAP: Patient assistance application for Farxiga  through [employer] ([employer]) has been mailed to pt's home address on file. Provider portion of application will be faxed to provider's office.For renewal 2026.

## 2024-07-09 ENCOUNTER — Other Ambulatory Visit (HOSPITAL_COMMUNITY): Payer: Self-pay

## 2024-07-09 NOTE — Telephone Encounter (Signed)
 Received provider portion PAP application for Farxiga  ([employer])

## 2024-08-11 ENCOUNTER — Other Ambulatory Visit: Payer: Self-pay

## 2024-08-11 MED ORDER — FREESTYLE LITE TEST VI STRP: ORAL_STRIP | 2 refills | Status: DC

## 2024-08-28 NOTE — Patient Instructions (Signed)

## 2024-08-31 ENCOUNTER — Ambulatory Visit: Payer: Medicare (Managed Care) | Admitting: Internal Medicine

## 2024-08-31 ENCOUNTER — Encounter: Payer: Self-pay | Admitting: Internal Medicine

## 2024-08-31 ENCOUNTER — Other Ambulatory Visit: Payer: Self-pay

## 2024-08-31 VITALS — BP 122/70 | HR 70 | Temp 98.3°F | Ht 61.0 in | Wt 182.6 lb

## 2024-08-31 DIAGNOSIS — Z6834 Body mass index (BMI) 34.0-34.9, adult: Secondary | ICD-10-CM

## 2024-08-31 DIAGNOSIS — N182 Chronic kidney disease, stage 2 (mild): Secondary | ICD-10-CM | POA: Diagnosis not present

## 2024-08-31 DIAGNOSIS — E6609 Other obesity due to excess calories: Secondary | ICD-10-CM

## 2024-08-31 DIAGNOSIS — E1122 Type 2 diabetes mellitus with diabetic chronic kidney disease: Secondary | ICD-10-CM | POA: Diagnosis not present

## 2024-08-31 DIAGNOSIS — I129 Hypertensive chronic kidney disease with stage 1 through stage 4 chronic kidney disease, or unspecified chronic kidney disease: Secondary | ICD-10-CM

## 2024-08-31 DIAGNOSIS — Z113 Encounter for screening for infections with a predominantly sexual mode of transmission: Secondary | ICD-10-CM

## 2024-08-31 DIAGNOSIS — E7800 Pure hypercholesterolemia, unspecified: Secondary | ICD-10-CM

## 2024-08-31 DIAGNOSIS — E66811 Obesity, class 1: Secondary | ICD-10-CM | POA: Diagnosis not present

## 2024-08-31 MED ORDER — FREESTYLE LITE TEST VI STRP: ORAL_STRIP | 2 refills | Status: AC

## 2024-08-31 NOTE — Assessment & Plan Note (Signed)
 Chronic, well controlled.  Blood pressure managed with amlodipine , lisinopril , and hydrochlorothiazide . - Continue current antihypertensive regimen - Follow low sodium diet.

## 2024-08-31 NOTE — Progress Notes (Signed)
 I,Monica Ryan, CMA,acting as a neurosurgeon for Monica LOISE Slocumb, MD.,have documented all relevant documentation on the behalf of Monica LOISE Slocumb, MD,as directed by  Monica LOISE Slocumb, MD while in the presence of Monica LOISE Slocumb, MD.  Subjective:  Patient ID: Monica Ryan , female    DOB: [birth year] , 68 y.o.   MRN: [id number]  Chief Complaint  Patient presents with   Diabetes    Patient presents today for dm, bp & cholesterol follow up. She reports compliance with medications. Denies headache, chest pain & sob.  She would like to be tested for STDs. She does not currently experience any symptoms.    Hypertension   Hyperlipidemia    HPI Discussed the use of AI scribe software for clinical note transcription with the patient, who gave verbal consent to proceed.  History of Present Illness Monica Ryan is a 68 year old female with diabetes and hypertension who presents for a routine follow-up.  Blood sugar levels range from 120 to 140 mg/dL, with most readings around 130 mg/dL. She is currently taking Farxiga  10 mg, Ozempic , and aspirin 81 mg for diabetes management. She also takes amlodipine  5 mg in the evening and lisinopril /hydrochlorothiazide  25/12.5 mg in the morning for hypertension. She engages in physical activity three to four days a week and reports good energy levels.  She is up to date with preventive screenings, including colonoscopy and eye exams, and is due for a bone density test next year. She takes vitamin D  supplements, and her last vitamin D  level was 48 ng/mL.  She mentions a past issue with diarrhea, which has improved since her last colonoscopy. She experiences regular bowel movements daily and does not currently identify any specific food triggers for her symptoms.  She has a history of plantar fasciitis, which developed when she was walking outside, leading her to exercise at a center instead. She does not take Motrin  frequently, only when in significant  pain.  She recalls an abnormal MRI in the past, which showed a mildly dilated common bile duct, but subsequent imaging did not confirm this finding.   Diabetes She presents for her follow-up diabetic visit. She has type 2 diabetes mellitus. Her disease course has been stable. There are no hypoglycemic associated symptoms. Pertinent negatives for diabetes include no blurred vision, no polydipsia, no polyphagia and no polyuria. There are no hypoglycemic complications. Risk factors for coronary artery disease include diabetes mellitus, dyslipidemia, hypertension and post-menopausal. She is following a diabetic diet. She participates in exercise three times a week. Her breakfast blood glucose is taken between 7-8 am. Her breakfast blood glucose range is generally 90-110 mg/dl. An ACE inhibitor/angiotensin II receptor blocker is being taken. Eye exam is current.  Hypertension This is a chronic problem. The current episode started more than 1 year ago. The problem is controlled. Pertinent negatives include no blurred vision. Risk factors for coronary artery disease include diabetes mellitus, dyslipidemia and obesity. The current treatment provides moderate improvement.     Past Medical History:  Diagnosis Date   Colon cancer screening 03/18/2023   Diabetes mellitus without complication (HCC)    Diarrhea 10/30/2021   Hyperlipemia    Hypertension      Family History  Problem Relation Age of Onset   Diabetes Mother    Alzheimer's disease Father    Diabetes Sister    Diabetes Brother      Current Outpatient Medications:    Alcohol  Swabs (ALCOHOL  PADS) 70 % PADS, Use  as instructed to check blood sugars 1 time per day dx: e11.65, Disp: 150 each, Rfl: 2   amLODipine  (NORVASC ) 5 MG tablet, Take 1 tablet (5 mg total) by mouth daily., Disp: 90 tablet, Rfl: 3   aspirin EC 81 MG tablet, Take 81 mg by mouth daily., Disp: , Rfl:    atorvastatin  (LIPITOR) 20 MG tablet, Take 1 tablet (20 mg total) by  mouth daily., Disp: 90 tablet, Rfl: 3   Blood Glucose Monitoring Suppl (ONETOUCH VERIO REFLECT) w/Device KIT, Check blood sugars once daily E11.69, Disp: 1 kit, Rfl: 1   Blood Glucose Monitoring Suppl (TRUE METRIX AIR GLUCOSE METER) w/Device KIT, Use as directed to check blood sugars once daily E11.69, Disp: 1 kit, Rfl: 0   dapagliflozin  propanediol (FARXIGA ) 10 MG TABS tablet, TAKE 1 TABLET BY MOUTH EVERY DAY IN THE MORNING, Disp: 90 tablet, Rfl: 3   Hyoscyamine Sulfate SL 0.125 MG SUBL, Sublingual; Duration: 30 Days, Disp: , Rfl:    Lancets (ONETOUCH DELICA PLUS LANCET33G) MISC, Check blood sugars once daily E11.69, Disp: 100 each, Rfl: 3   Lancets MISC, Use as directed to check blood sugars once daily E11.69, Disp: 100 each, Rfl: 2   lisinopril -hydrochlorothiazide  (ZESTORETIC ) 20-12.5 MG tablet, Take 1 tablet by mouth daily., Disp: 90 tablet, Rfl: 3   Semaglutide , 2 MG/DOSE, (OZEMPIC , 2 MG/DOSE,) 8 MG/3ML SOPN, Inject 2 mg into the skin once a week., Disp: 9 mL, Rfl: 2   glucose blood (FREESTYLE LITE) test strip, Use as instructed to check blood sugars twice daily E11.69, Disp: 100 each, Rfl: 2   No Known Allergies   Review of Systems  Constitutional: Negative.   Eyes:  Negative for blurred vision.  Respiratory: Negative.    Cardiovascular: Negative.   Endocrine: Negative for polydipsia, polyphagia and polyuria.  Neurological: Negative.   Psychiatric/Behavioral: Negative.       Today's Vitals   08/31/24 0849  BP: 122/70  Pulse: 70  Temp: 98.3 F (36.8 C)  SpO2: 98%  Weight: 182 lb 9.6 oz (82.8 kg)  Height: 5' 1 (1.549 m)   Body mass index is 34.5 kg/m.  Wt Readings from Last 3 Encounters:  08/31/24 182 lb 9.6 oz (82.8 kg)  05/27/24 178 lb 6.4 oz (80.9 kg)  04/27/24 183 lb (83 kg)    The 10-year ASCVD risk score (Arnett DK, et al., 2019) is: 21.5%   Values used to calculate the score:     Age: 1 years     Clincally relevant sex: Female     Is Non-Hispanic African  American: Yes     Diabetic: Yes     Tobacco smoker: No     Systolic Blood Pressure: 122 mmHg     Is BP treated: Yes     HDL Cholesterol: 86 mg/dL     Total Cholesterol: 181 mg/dL  Objective:  Physical Exam Vitals and nursing note reviewed.  Constitutional:      Appearance: Normal appearance.  HENT:     Head: Normocephalic and atraumatic.  Eyes:     Extraocular Movements: Extraocular movements intact.  Cardiovascular:     Rate and Rhythm: Normal rate and regular rhythm.     Heart sounds: Normal heart sounds.  Pulmonary:     Effort: Pulmonary effort is normal.     Breath sounds: Normal breath sounds.  Musculoskeletal:     Cervical back: Normal range of motion.  Skin:    General: Skin is warm.  Neurological:     General:  No focal deficit present.     Mental Status: She is alert.  Psychiatric:        Mood and Affect: Mood normal.        Behavior: Behavior normal.         Assessment And Plan:   Assessment & Plan Type 2 diabetes mellitus with stage 2 chronic kidney disease, without long-term current use of insulin (HCC) Chronic, blood glucose levels stable around 130 mg/dL. Slight decrease in kidney function likely due to dehydration. No proteinuria, indicating no significant diabetic kidney damage. Emphasized hydration importance. - Continue with Ozempic  2mg  weekly. - Ordered A1c test. - Ordered kidney function tests. - Encouraged adequate hydration. Hypertensive nephropathy Chronic, well controlled.  Blood pressure managed with amlodipine , lisinopril , and hydrochlorothiazide . - Continue current antihypertensive regimen - Follow low sodium diet.  Pure hypercholesterolemia Chronic, LDL goal is less than 70. She will continue with atorvastatin  20mg  daily.  Class 1 obesity due to excess calories with serious comorbidity and body mass index (BMI) of 34.0 to 34.9 in adult She is encouraged to strive for BMI less than 30 to decrease cardiac risk. Advised to aim for at least  150 minutes of exercise per week.  Screening for STD (sexually transmitted disease) I will check labs as requested.   Orders Placed This Encounter  Procedures   Chlamydia/Gonococcus/Trichomonas, NAA   CMP14+EGFR   Hemoglobin A1c   HIV antibody (with reflex)   RPR   Hepatitis C antibody   Hepatitis B Surface Antigen   HSV(herpes simplex vrs) 1+2 ab-IgG     Return if symptoms worsen or fail to improve.  Patient was given opportunity to ask questions. Patient verbalized understanding of the plan and was able to repeat key elements of the plan. All questions were answered to their satisfaction.    I, Monica LOISE Slocumb, MD, have reviewed all documentation for this visit. The documentation on 08/31/24 for the exam, diagnosis, procedures, and orders are all accurate and complete.   IF YOU HAVE BEEN REFERRED TO A SPECIALIST, IT MAY TAKE 1-2 WEEKS TO SCHEDULE/PROCESS THE REFERRAL. IF YOU HAVE NOT HEARD FROM US /SPECIALIST IN TWO WEEKS, PLEASE GIVE US  A CALL AT [phone] X [phone].

## 2024-08-31 NOTE — Addendum Note (Signed)
 Addended by: GLADIS KRISTEEN PARAS on: 08/31/2024 09:47 AM   Modules accepted: Orders

## 2024-08-31 NOTE — Assessment & Plan Note (Signed)
Chronic, LDL goal is less than 70. She will continue with atorvastatin 20mg  daily.

## 2024-08-31 NOTE — Assessment & Plan Note (Addendum)
 She is encouraged to strive for BMI less than 30 to decrease cardiac risk. Advised to aim for at least 150 minutes of exercise per week.

## 2024-08-31 NOTE — Assessment & Plan Note (Signed)
 Chronic, blood glucose levels stable around 130 mg/dL. Slight decrease in kidney function likely due to dehydration. No proteinuria, indicating no significant diabetic kidney damage. Emphasized hydration importance. - Continue with Ozempic  2mg  weekly. - Ordered A1c test. - Ordered kidney function tests. - Encouraged adequate hydration.

## 2024-09-01 ENCOUNTER — Ambulatory Visit: Payer: Self-pay | Admitting: Internal Medicine

## 2024-09-01 LAB — CMP14+EGFR
ALT: 32 IU/L (ref 0–32)
AST: 26 IU/L (ref 0–40)
Albumin: 4.3 g/dL (ref 3.9–4.9)
Alkaline Phosphatase: 148 IU/L — ABNORMAL HIGH (ref 49–135)
BUN/Creatinine Ratio: 12 (ref 12–28)
BUN: 11 mg/dL (ref 8–27)
Bilirubin Total: 0.5 mg/dL (ref 0.0–1.2)
CO2: 24 mmol/L (ref 20–29)
Calcium: 9.2 mg/dL (ref 8.7–10.3)
Chloride: 101 mmol/L (ref 96–106)
Creatinine, Ser: 0.91 mg/dL (ref 0.57–1.00)
Globulin, Total: 2.2 g/dL (ref 1.5–4.5)
Glucose: 91 mg/dL (ref 70–99)
Potassium: 4.1 mmol/L (ref 3.5–5.2)
Sodium: 138 mmol/L (ref 134–144)
Total Protein: 6.5 g/dL (ref 6.0–8.5)
eGFR: 68 mL/min/1.73 (ref >59)

## 2024-09-01 LAB — HIV ANTIBODY (ROUTINE TESTING W REFLEX): HIV Screen 4th Generation wRfx: NONREACTIVE

## 2024-09-01 LAB — SYPHILIS: RPR W/REFLEX TO RPR TITER AND TREPONEMAL ANTIBODIES, TRADITIONAL SCREENING AND DIAGNOSIS ALGORITHM: RPR Ser Ql: NONREACTIVE

## 2024-09-01 LAB — HEMOGLOBIN A1C
Est. average glucose Bld gHb Est-mCnc: 137 mg/dL
Hgb A1c MFr Bld: 6.4 % — ABNORMAL HIGH (ref 4.8–5.6)

## 2024-09-01 LAB — HEPATITIS C ANTIBODY: Hep C Virus Ab: NONREACTIVE

## 2024-09-01 LAB — HEPATITIS B SURFACE ANTIGEN: Hepatitis B Surface Ag: NEGATIVE

## 2024-09-01 LAB — HSV 1 AND 2 AB, IGG
HSV 1 Glycoprotein G Ab, IgG: NONREACTIVE
HSV 2 IgG, Type Spec: REACTIVE — AB

## 2024-09-02 LAB — CHLAMYDIA/GONOCOCCUS/TRICHOMONAS, NAA
Chlamydia by NAA: NEGATIVE
Gonococcus by NAA: NEGATIVE
Trich vag by NAA: NEGATIVE

## 2024-09-02 NOTE — Telephone Encounter (Signed)
 PAP: Application for Monica Ryan has been submitted to [employer] ([employer]), via fax

## 2024-09-22 NOTE — Telephone Encounter (Signed)
 PAP: Patient assistance application for Farxiga  has been approved by PAP Companies: [employer] from 09/24/2024 to 09/23/2025. Medication should be delivered to PAP Delivery: Home. For further shipping updates, please contact [employer] ([employer]) at [phone]. Patient ID is: not provided.

## 2024-09-28 ENCOUNTER — Encounter: Payer: Self-pay | Admitting: Internal Medicine

## 2024-09-28 ENCOUNTER — Other Ambulatory Visit: Payer: Self-pay

## 2024-09-28 MED ORDER — FREESTYLE LANCETS MISC: 2 refills | Status: DC

## 2024-10-02 ENCOUNTER — Other Ambulatory Visit: Payer: Self-pay

## 2024-10-02 MED ORDER — FREESTYLE LANCETS MISC: 2 refills | Status: AC

## 2024-10-07 ENCOUNTER — Ambulatory Visit: Payer: Medicare (Managed Care) | Admitting: Dermatology

## 2024-10-07 ENCOUNTER — Encounter: Payer: Self-pay | Admitting: Dermatology

## 2024-10-07 ENCOUNTER — Other Ambulatory Visit: Payer: Self-pay | Admitting: Internal Medicine

## 2024-10-07 VITALS — BP 115/60

## 2024-10-07 DIAGNOSIS — L658 Other specified nonscarring hair loss: Secondary | ICD-10-CM

## 2024-10-07 DIAGNOSIS — L649 Androgenic alopecia, unspecified: Secondary | ICD-10-CM

## 2024-10-07 MED ORDER — SAFETY SEAL MISCELLANEOUS MISC: 11 refills | Status: AC

## 2024-10-07 NOTE — Progress Notes (Signed)
" ° °  Follow-Up Visit   Subjective  Monica Ryan is a 69 y.o. female established patient who presents for FOLLOW UP on the diagnoses listed below:  Patient was last evaluated on 05/05/24 for warts but pt presents today with new concern.   Hair Thinning: Pt stated that she noticed thinning at the temples 15 years ago and thinning at the crown between 5-10 years. She has been using Nutrafol supplements for the last 2 months. She has not had any Rx topicals. Scalp has not been tender or itchy. She stated in college she did have Hx of hair relaxer's and tight hair styles.    The following portions of the chart were reviewed this encounter and updated as appropriate: medications, allergies, medical history  Review of Systems:  No other skin or systemic complaints except as noted in HPI or Assessment and Plan.  Objective  Well appearing patient in no apparent distress; mood and affect are within normal limits.   A focused examination was performed of the following areas: scalp   Relevant exam findings are noted in the Assessment and Plan.             Assessment & Plan   TRACTION & ANDROGENETIC ALOPECIA Exam: Diffuse thinning of the crown and widening of the midline part with retention of the frontal hairline   Chronic hair thinning with traction alopecia due to tension from hair styling and chemical relaxation, and androgenetic alopecia due to hormonal changes post-menopause. Traction alopecia causes inflammation and scarring of hair follicles, while androgenetic alopecia involves shrinking of hair follicles. The condition has been more aggressive over the last couple of years. Nutrafol has been used for two months with some improvement, but medical treatment is necessary for optimal results. The compounded treatment is expected to promote hair regrowth, with maximum results in 12 to 18 months. Insurance does not cover compounded medications, but the compounded treatment is more  effective and less irritating than over-the-counter options.  - Prescribed compounded medication containing clobetasol, minoxidil, and finasteride to be applied in the morning. - Continue Nutrafol in combination with the compounded medication. - Advised cutting hair to reduce tension and prevent further traction alopecia. - Scheduled follow-up in four months to assess progress and adjust treatment as necessary.     No follow-ups on file.   Documentation: I have reviewed the above documentation for accuracy and completeness, and I agree with the above.  I, Shirron Maranda, CMA II, am acting as scribe for:  Delon Lenis, DO "

## 2024-10-07 NOTE — Patient Instructions (Addendum)
 VISIT SUMMARY:  You came in today because you have been experiencing hair thinning and hair loss. You have tried some over-the-counter products with some success, but you are concerned about the possibility of permanent damage to your hair follicles. You have a history of chemically relaxing your hair, which you believe may have contributed to your hair loss. You have not seen a dermatologist for this issue before.  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 Traction alopecia is caused by tension from hair styling and chemical relaxation, leading to inflammation and scarring of hair follicles. Androgenetic alopecia is due to hormonal changes post-menopause, causing the hair follicles to shrink.   To address this, you have been prescribed a compounded medication containing clobetasol, minoxidil, and finasteride to be applied in the morning.   You should continue using Nutrafol in combination with the compounded medication. Additionally, it is advised to cut your hair to reduce tension and prevent further traction alopecia. We will follow up in four months to assess your progress and adjust the treatment as necessary.  INSTRUCTIONS:  Please follow up in four months to assess your progress and adjust the treatment as necessary. Important Information  Due to recent changes in healthcare laws, you may see results of your pathology and/or laboratory studies on MyChart before the doctors have had a chance to review them. We understand that in some cases there may be results that are confusing or concerning to you. Please understand that not all results are received at the same time and often the doctors may need to interpret multiple results in order to provide you with the best plan of care or course of treatment. Therefore, we ask that you please give us  2 business days to thoroughly review all your results before  contacting the office for clarification. Should we see a critical lab result, you will be contacted sooner.   If You Need Anything After Your Visit  If you have any questions or concerns for your doctor, please call our main line at [phone] If no one answers, please leave a voicemail as directed and we will return your call as soon as possible. Messages left after 4 pm will be answered the following business day.   You may also send us  a message via MyChart. We typically respond to MyChart messages within 1-2 business days.  For prescription refills, please ask your pharmacy to contact our office. Our fax number is [phone].  If you have an urgent issue when the clinic is closed that cannot wait until the next business day, you can page your doctor at the number below.    Please note that while we do our best to be available for urgent issues outside of office hours, we are not available 24/7.   If you have an urgent issue and are unable to reach us , you may choose to seek medical care at your doctor's office, retail clinic, urgent care center, or emergency room.  If you have a medical emergency, please immediately call 911 or go to the emergency department. In the event of inclement weather, please call our main line at [phone] for an update on the status of any delays or closures.  Dermatology Medication Tips: Please keep the boxes that topical medications come in in order to help keep track of the instructions about where and how to use these. Pharmacies typically print the medication instructions only on the boxes and not directly  on the medication tubes.   If your medication is too expensive, please contact our office at [phone] or send us  a message through MyChart.   We are unable to tell what your co-pay for medications will be in advance as this is different depending on your insurance coverage. However, we may be able to find a substitute medication at lower cost  or fill out paperwork to get insurance to cover a needed medication.   If a prior authorization is required to get your medication covered by your insurance company, please allow us  1-2 business days to complete this process.  Drug prices often vary depending on where the prescription is filled and some pharmacies may offer cheaper prices.  The website www.goodrx.com contains coupons for medications through different pharmacies. The prices here do not account for what the cost may be with help from insurance (it may be cheaper with your insurance), but the website can give you the price if you did not use any insurance.  - You can print the associated coupon and take it with your prescription to the pharmacy.  - You may also stop by our office during regular business hours and pick up a GoodRx coupon card.  - If you need your prescription sent electronically to a different pharmacy, notify our office through [facility] or by phone at [phone]

## 2024-12-16 ENCOUNTER — Ambulatory Visit: Payer: Medicare (Managed Care)

## 2024-12-17 ENCOUNTER — Encounter: Payer: Medicare (Managed Care) | Admitting: Internal Medicine

## 2025-02-16 ENCOUNTER — Ambulatory Visit: Payer: Medicare (Managed Care) | Admitting: Dermatology
